# Patient Record
Sex: Male | Born: 1945 | Race: White | Hispanic: No | Marital: Married | Smoking: Never smoker
Health system: Southern US, Community
[De-identification: ages and names within clinical notes are randomized; demographics above are authoritative.]

## PROBLEM LIST (undated history)

## (undated) DIAGNOSIS — H269 Unspecified cataract: Secondary | ICD-10-CM

## (undated) DIAGNOSIS — G8929 Other chronic pain: Secondary | ICD-10-CM

## (undated) DIAGNOSIS — R399 Unspecified symptoms and signs involving the genitourinary system: Secondary | ICD-10-CM

## (undated) DIAGNOSIS — E785 Hyperlipidemia, unspecified: Secondary | ICD-10-CM

## (undated) DIAGNOSIS — R519 Headache, unspecified: Secondary | ICD-10-CM

## (undated) DIAGNOSIS — K219 Gastro-esophageal reflux disease without esophagitis: Secondary | ICD-10-CM

## (undated) DIAGNOSIS — D126 Benign neoplasm of colon, unspecified: Secondary | ICD-10-CM

## (undated) DIAGNOSIS — T7840XA Allergy, unspecified, initial encounter: Secondary | ICD-10-CM

## (undated) DIAGNOSIS — R51 Headache: Secondary | ICD-10-CM

## (undated) HISTORY — DX: Unspecified cataract: H26.9

## (undated) HISTORY — PX: POLYPECTOMY: SHX149

## (undated) HISTORY — DX: Headache: R51

## (undated) HISTORY — DX: Allergy, unspecified, initial encounter: T78.40XA

## (undated) HISTORY — DX: Other chronic pain: G89.29

## (undated) HISTORY — PX: COLONOSCOPY: SHX174

## (undated) HISTORY — PX: EYE SURGERY: SHX253

## (undated) HISTORY — DX: Hyperlipidemia, unspecified: E78.5

## (undated) HISTORY — DX: Unspecified symptoms and signs involving the genitourinary system: R39.9

## (undated) HISTORY — DX: Benign neoplasm of colon, unspecified: D12.6

## (undated) HISTORY — DX: Gastro-esophageal reflux disease without esophagitis: K21.9

## (undated) HISTORY — DX: Headache, unspecified: R51.9

---

## 1996-01-28 HISTORY — PX: CHOLECYSTECTOMY: SHX55

## 2003-04-17 ENCOUNTER — Encounter: Admission: RE | Admit: 2003-04-17 | Discharge: 2003-04-17 | Payer: Self-pay | Admitting: Family Medicine

## 2003-12-08 ENCOUNTER — Ambulatory Visit: Payer: Self-pay | Admitting: Gastroenterology

## 2003-12-14 ENCOUNTER — Ambulatory Visit: Payer: Self-pay | Admitting: Gastroenterology

## 2004-01-31 ENCOUNTER — Ambulatory Visit: Payer: Self-pay | Admitting: Gastroenterology

## 2004-02-02 ENCOUNTER — Ambulatory Visit: Payer: Self-pay | Admitting: Gastroenterology

## 2004-02-07 ENCOUNTER — Ambulatory Visit (HOSPITAL_COMMUNITY): Admission: RE | Admit: 2004-02-07 | Discharge: 2004-02-07 | Payer: Self-pay | Admitting: Gastroenterology

## 2004-02-12 ENCOUNTER — Ambulatory Visit: Payer: Self-pay | Admitting: Gastroenterology

## 2005-06-16 ENCOUNTER — Ambulatory Visit: Payer: Self-pay | Admitting: Gastroenterology

## 2005-06-27 ENCOUNTER — Encounter: Payer: Self-pay | Admitting: Gastroenterology

## 2005-06-27 ENCOUNTER — Ambulatory Visit: Payer: Self-pay | Admitting: Gastroenterology

## 2007-05-21 LAB — CONVERTED CEMR LAB
BUN: 16 mg/dL
CO2, serum: 22 mmol/L
Chloride, Serum: 107 mmol/L
Creatinine, Ser: 0.97 mg/dL
Glucose, Bld: 110 mg/dL
Platelets: 206 10*3/uL
Triglycerides: 54 mg/dL
WBC, blood: 8.7 10*3/uL

## 2007-09-24 ENCOUNTER — Encounter: Payer: Self-pay | Admitting: Internal Medicine

## 2007-09-24 LAB — CONVERTED CEMR LAB
HDL: 68 mg/dL
LDL Cholesterol: 137 mg/dL
Triglycerides: 62 mg/dL

## 2008-05-29 ENCOUNTER — Ambulatory Visit: Payer: Self-pay | Admitting: Internal Medicine

## 2008-05-29 DIAGNOSIS — K219 Gastro-esophageal reflux disease without esophagitis: Secondary | ICD-10-CM

## 2008-05-29 DIAGNOSIS — L989 Disorder of the skin and subcutaneous tissue, unspecified: Secondary | ICD-10-CM | POA: Insufficient documentation

## 2008-06-05 ENCOUNTER — Encounter (INDEPENDENT_AMBULATORY_CARE_PROVIDER_SITE_OTHER): Payer: Self-pay | Admitting: *Deleted

## 2008-06-05 LAB — CONVERTED CEMR LAB
AST: 31 units/L (ref 0–37)
Basophils Absolute: 0 10*3/uL (ref 0.0–0.1)
Basophils Relative: 0 % (ref 0.0–3.0)
CO2: 26 meq/L (ref 19–32)
Calcium: 9.2 mg/dL (ref 8.4–10.5)
Cholesterol: 210 mg/dL — ABNORMAL HIGH (ref 0–200)
Creatinine, Ser: 0.9 mg/dL (ref 0.4–1.5)
Eosinophils Absolute: 0.1 10*3/uL (ref 0.0–0.7)
Lymphocytes Relative: 25.6 % (ref 12.0–46.0)
MCHC: 35.2 g/dL (ref 30.0–36.0)
Neutrophils Relative %: 71.2 % (ref 43.0–77.0)
RBC: 4.82 M/uL (ref 4.22–5.81)
RDW: 12.6 % (ref 11.5–14.6)
TSH: 1.38 microintl units/mL (ref 0.35–5.50)

## 2008-06-20 ENCOUNTER — Encounter: Payer: Self-pay | Admitting: Internal Medicine

## 2008-06-20 ENCOUNTER — Ambulatory Visit: Payer: Self-pay | Admitting: Internal Medicine

## 2008-06-23 ENCOUNTER — Ambulatory Visit: Payer: Self-pay | Admitting: Internal Medicine

## 2008-06-23 LAB — CONVERTED CEMR LAB
Bilirubin Urine: NEGATIVE
Protein, U semiquant: 100
RBC / HPF: NONE SEEN (ref <3)
Urobilinogen, UA: 0.2
WBC, UA: NONE SEEN cells/hpf (ref <3)

## 2008-06-24 ENCOUNTER — Encounter: Payer: Self-pay | Admitting: Internal Medicine

## 2008-06-28 ENCOUNTER — Ambulatory Visit: Payer: Self-pay | Admitting: Internal Medicine

## 2008-07-25 ENCOUNTER — Telehealth (INDEPENDENT_AMBULATORY_CARE_PROVIDER_SITE_OTHER): Payer: Self-pay | Admitting: *Deleted

## 2008-11-29 ENCOUNTER — Ambulatory Visit: Payer: Self-pay | Admitting: Internal Medicine

## 2008-11-29 DIAGNOSIS — R972 Elevated prostate specific antigen [PSA]: Secondary | ICD-10-CM

## 2008-11-29 DIAGNOSIS — R350 Frequency of micturition: Secondary | ICD-10-CM

## 2008-11-29 LAB — CONVERTED CEMR LAB
Glucose, Urine, Semiquant: NEGATIVE
Nitrite: NEGATIVE
PSA: 2.87 ng/mL (ref 0.10–4.00)
Specific Gravity, Urine: 1.015
WBC Urine, dipstick: NEGATIVE
pH: 6

## 2008-11-30 ENCOUNTER — Encounter: Payer: Self-pay | Admitting: Internal Medicine

## 2009-06-12 ENCOUNTER — Ambulatory Visit: Payer: Self-pay | Admitting: Internal Medicine

## 2009-06-13 ENCOUNTER — Encounter: Payer: Self-pay | Admitting: Internal Medicine

## 2009-06-14 ENCOUNTER — Telehealth (INDEPENDENT_AMBULATORY_CARE_PROVIDER_SITE_OTHER): Payer: Self-pay | Admitting: *Deleted

## 2009-06-14 LAB — CONVERTED CEMR LAB
ALT: 27 units/L (ref 0–53)
AST: 18 units/L (ref 0–37)
Basophils Absolute: 0 10*3/uL (ref 0.0–0.1)
Calcium: 9.3 mg/dL (ref 8.4–10.5)
Cholesterol: 226 mg/dL — ABNORMAL HIGH (ref 0–200)
Creatinine, Ser: 0.8 mg/dL (ref 0.4–1.5)
Direct LDL: 159.6 mg/dL
Eosinophils Absolute: 0.1 10*3/uL (ref 0.0–0.7)
GFR calc non Af Amer: 103.42 mL/min (ref >60)
Glucose, Bld: 89 mg/dL (ref 70–99)
HDL: 60.9 mg/dL (ref >39.00)
Hemoglobin: 15.4 g/dL (ref 13.0–17.0)
Lymphocytes Relative: 31.9 % (ref 12.0–46.0)
Monocytes Relative: 7.9 % (ref 3.0–12.0)
Neutro Abs: 3.2 10*3/uL (ref 1.4–7.7)
Neutrophils Relative %: 58.4 % (ref 43.0–77.0)
Platelets: 193 10*3/uL (ref 150.0–400.0)
RDW: 13.3 % (ref 11.5–14.6)
Sodium: 141 meq/L (ref 135–145)
Total CHOL/HDL Ratio: 4
VLDL: 7.4 mg/dL (ref 0.0–40.0)

## 2009-08-02 ENCOUNTER — Telehealth (INDEPENDENT_AMBULATORY_CARE_PROVIDER_SITE_OTHER): Payer: Self-pay | Admitting: *Deleted

## 2009-08-07 ENCOUNTER — Encounter: Payer: Self-pay | Admitting: Internal Medicine

## 2009-09-10 ENCOUNTER — Ambulatory Visit: Payer: Self-pay | Admitting: Internal Medicine

## 2009-09-10 DIAGNOSIS — E785 Hyperlipidemia, unspecified: Secondary | ICD-10-CM

## 2009-09-13 LAB — CONVERTED CEMR LAB: Cholesterol: 188 mg/dL (ref 0–200)

## 2009-10-08 ENCOUNTER — Ambulatory Visit: Payer: Self-pay | Admitting: Internal Medicine

## 2009-10-08 DIAGNOSIS — J019 Acute sinusitis, unspecified: Secondary | ICD-10-CM | POA: Insufficient documentation

## 2009-10-24 ENCOUNTER — Telehealth (INDEPENDENT_AMBULATORY_CARE_PROVIDER_SITE_OTHER): Payer: Self-pay | Admitting: *Deleted

## 2010-02-12 ENCOUNTER — Ambulatory Visit
Admission: RE | Admit: 2010-02-12 | Discharge: 2010-02-12 | Payer: Self-pay | Source: Home / Self Care | Attending: Internal Medicine | Admitting: Internal Medicine

## 2010-02-26 NOTE — Assessment & Plan Note (Signed)
Summary: CPX,FASTING,BC[employer] INS/RH......Rick Campbell   Vital Signs:  Patient profile:   65 year old male Height:      65.5 inches Weight:      181.6 pounds BMI:     29.87 Pulse rate:   60 / minute BP sitting:   110 / 60  Vitals Entered By: Shary Decamp (Jun 12, 2009 9:49 AM) CC: CPX, FASTING Is Patient Diabetic? No   History of Present Illness: CPX  occasionally has R ear ache  no d/c, no obvious  hearing decreased h/o B tinnitus x years   Preventive Screening-Counseling & Management  Alcohol-Tobacco     Alcohol drinks/day: <1     Alcohol type: WINE     Smoking Status: never  Caffeine-Diet-Exercise     Caffeine use/day: 2     Times/week: 4      Drug Use:  no.    Current Medications (verified): 1)  Aspir-Low 81 Mg Tbec (Aspirin) .Rick Campbell.. 1 By Mouth Once Daily 2)  Aciphex 20 Mg Tbec (Rabeprazole Sodium) .Rick Campbell.. 1 By Mouth Once Daily 3)  Mvi  Allergies (verified): 1)  ! Sulfa  Past History:  Past Medical History: Reviewed history from 05/29/2008 and no changes required. GERD  Past Surgical History: Reviewed history from 05/29/2008 and no changes required. Cholecystectomy (2002)  Family History: Fa 65 y/o CAD - F (MI, CABG age 82 - CHF) HTN - M Stroke - no DM - F colon Ca - F lung Ca - M +smoker prostate Ca - no  Social History: Married 2 kids retired from [employer] Never Smoked Alcohol use-yes (1-2 glasses wine every week) Drug use-no Regular exercise-yes (3-4 x wk) diet-- trying to eat healthy Smoking Status:  never Drug Use:  no Caffeine use/day:  2  Review of Systems General:  Denies fatigue, fever, and weight loss. CV:  Denies chest pain or discomfort and swelling of feet. Resp:  Denies cough and shortness of breath. GI:  Denies bloody stools, diarrhea, nausea, and vomiting. GU:  Denies urinary frequency and urinary hesitancy.  Physical Exam  General:  alert, well-developed, and well-nourished.   Ears:  R ear normal, L ear normal, and no external  deformities.  palpation of the mastoid area is symmetric, no redness. Neck:  no masses and no thyromegaly.   Lungs:  normal respiratory effort, no intercostal retractions, no accessory muscle use, and normal breath sounds.   Heart:  normal rate, regular rhythm, no murmur, and no gallop.   Abdomen:  soft, non-tender, normal bowel sounds, no distention, and no masses.   Rectal:  No external abnormalities noted. Normal sphincter tone. No rectal masses or tenderness. Hemoccult negative Prostate:  Prostate gland firm and smooth, no enlargement, nodularity, tenderness, mass, asymmetry or induration. Extremities:  no lower extremity edema   Impression & Recommendations:  Problem # 1:  HEALTH SCREENING (ICD-V70.0) Td 05 Colonoscopy:   Date:  06/27/2005   Next Due:  06/2010    Results:  normal   PSAs were   usually around  2, one time it went upto 4----> recheck a PSA today recommend to continue with his healthy life style he also has mild otalgia, exam negative, offered a referral to ENT but we agreed to observe, will call if symptoms increase   Orders: TLB-Lipid Panel (80061-LIPID) TLB-TSH (Thyroid Stimulating Hormone) (84443-TSH) TLB-PSA (Prostate Specific Antigen) (84153-PSA) TLB-BMP (Basic Metabolic Panel-BMET) (80048-METABOL) TLB-CBC Platelet - w/Differential (85025-CBCD) TLB-ALT (SGPT) (84460-ALT) TLB-AST (SGOT) (84450-SGOT)  Complete Medication List: 1)  Aspir-low 81 Mg Tbec (  Aspirin) .Rick Campbell.. 1 by mouth once daily 2)  Aciphex 20 Mg Tbec (Rabeprazole sodium) .Rick Campbell.. 1 by mouth once daily 3)  Mvi   Other Orders: Venipuncture (21308)  Patient Instructions: 1)  Please schedule a follow-up appointment in 1 year.  2)  call by mid June, speak with my nurse and schedule a Shingles vaccine \  Preventive Care Screening  Prior Values:    PSA:  2.87 (11/29/2008)    Colonoscopy:  normal (06/27/2005)    Last Tetanus Booster:  Td (09/15/2003)    Risk Factors:  Tobacco use:  never Drug  use:  no Caffeine use:  2 drinks per day Alcohol use:  yes    Type:  WINE    Drinks per day:  <1 Exercise:  yes    Times per week:  4

## 2010-02-26 NOTE — Progress Notes (Signed)
Summary: ACIPHEX REFILL  Phone Note Refill Request Message from:  Fax from Pharmacy on October 24, 2009 4:56 PM  Refills Requested: Medication #1:  ACIPHEX 20 MG TBEC 1 by mouth once daily [facility] [phone]  Initial call taken by: Jerolyn Shin,  October 24, 2009 4:56 PM    Prescriptions: ACIPHEX 20 MG TBEC (RABEPRAZOLE SODIUM) 1 by mouth once daily  #90 x 1   Entered by:   Army Fossa CMA   Authorized by:   Nolon Rod. Paz MD   Signed by:   Army Fossa CMA on 10/25/2009   Method used:   Faxed to ...       [facility] (mail[facility]order)       [address] [place], [place]  [address]       Ph: [phone]       Fax: [phone]   RxID:   [id number]

## 2010-02-26 NOTE — Progress Notes (Signed)
Summary: ENT & Shingles Vaccine  Phone Note Call from Patient Call back at Home Phone [phone]   Caller: Patient Summary of Call: Patient called and left a voicemail on the triage line to find out if we had any avaible singles vaccines and to find out who we would recommed for an ENT specialist.  Initial call taken by: Harold Barban,  August 02, 2009 1:44 PM  Follow-up for Phone Call        Called patient back to inform him we do have the vaccine and that we normally refer to [facility].  Follow-up by: Harold Barban,  August 02, 2009 1:45 PM

## 2010-02-26 NOTE — Progress Notes (Signed)
Summary: results, [facility] 5/19  Phone Note Outgoing Call Call back at Work Phone [phone]   Reason for Call: Discuss lab or test results Details for Reason: advised patient his cholesterol is okay but has increased compared  to last year; because he has a family history of heart disease ideal LDL is  close to 100, currently 159.6 All other labs normal Plan: Improved diet, refer to a nutritionist if needed, FLP in 3 months... DX mild hyperlipidemia. (no need for office visit)  Consider medication Signed by Nolon Rod. Paz MD on 06/13/2009 at 4:49 PM  Summary of Call: left message on machine for pt to return call........Marland KitchenShary Decamp  Jun 14, 2009 9:50 AM left message on machine for pt to return call...........Marland KitchenShary Decamp  Jun 14, 2009 11:17 AM discussed with pt, copy mailed to pt, pt will let us know if he would like referral to nutritionist......Marland KitchenShary Decamp  Jun 14, 2009 11:37 AM

## 2010-02-26 NOTE — Assessment & Plan Note (Signed)
Summary: SINUS INF, NO FEVER,  HACKING DRY COUGH////SPH   Vital Signs:  Patient profile:   65 year old male Weight:      183.13 pounds Temp:     98.2 degrees F oral Pulse rate:   79 / minute Pulse rhythm:   regular BP sitting:   124 / 80  (left arm) Cuff size:   large  Vitals Entered By: Army Fossa CMA (October 08, 2009 2:53 PM) CC: Pt c/o Sinus Infection?  Comments Has a HA Dry Cough Nasal Drainage  Pharm- [facility] [address]   History of Present Illness: history of sinus congestion, dry cough, frontal headaches. ++ postnasal dripping.  ROS No fever No sputum production or nasal discharge No nausea or vomiting No myalgias  Current Medications (verified): 1)  Aspir-Low 81 Mg Tbec (Aspirin) .Marland Kitchen.. 1 By Mouth Once Daily 2)  Aciphex 20 Mg Tbec (Rabeprazole Sodium) .Marland Kitchen.. 1 By Mouth Once Daily 3)  Mvi  Allergies: 1)  ! Sulfa  Past History:  Past Medical History: Reviewed history from 05/29/2008 and no changes required. GERD  Past Surgical History: Reviewed history from 05/29/2008 and no changes required. Cholecystectomy (2002)  Social History: Reviewed history from 06/12/2009 and no changes required. Married 2 kids retired from [employer] Never Smoked Alcohol use-yes (1-2 glasses wine every week) Drug use-no Regular exercise-yes (3-4 x wk) diet-- trying to eat healthy  Physical Exam  General:  alert, well-developed, and well-nourished.   Head:  face symmetric, slightly tender in the frontal and sinus maxillary area Ears:  R ear normal and L ear normal.   Nose:  slightly congested Mouth:  no redness or discharge Lungs:  normal respiratory effort, no intercostal retractions, no accessory muscle use, and normal breath sounds.   Heart:  normal rate, regular rhythm, no murmur, and no gallop.     Impression & Recommendations:  Problem # 1:  SINUSITIS - ACUTE-NOS (ICD-461.9)  symptoms consistent with sinusitis, see instructions  His  updated medication list for this problem includes:    Amoxicillin 500 Mg Tabs (Amoxicillin) .Marland Kitchen... 2 by mouth twice a day for 10 days    Guiatuss Ac 100-10 Mg/30ml Syrp (Guaifenesin-codeine) ..... One or 2 teaspoons at bedtime as needed for severe cough  Complete Medication List: 1)  Aspir-low 81 Mg Tbec (Aspirin) .Marland Kitchen.. 1 by mouth once daily 2)  Aciphex 20 Mg Tbec (Rabeprazole sodium) .Marland Kitchen.. 1 by mouth once daily 3)  Mvi  4)  Amoxicillin 500 Mg Tabs (Amoxicillin) .... 2 by mouth twice a day for 10 days 5)  Guiatuss Ac 100-10 Mg/20ml Syrp (Guaifenesin-codeine) .... One or 2 teaspoons at bedtime as needed for severe cough  Patient Instructions: 1)  rest, fluids, Tylenol as needed 2)  Mucinex DM twice a day for one week 3)   Codeine at bedtime if the cough is severe 4)  Amoxicillin for 10 days 5)  Nasal saline spray twice a day 6)  Call if not improving within the next 10 days Prescriptions: GUIATUSS AC 100-10 MG/5ML SYRP (GUAIFENESIN-CODEINE) One or 2 teaspoons at bedtime as needed for severe cough  #120cc x 0   Entered and Authorized by:   Nolon Rod. Paz MD   Signed by:   Nolon Rod. Paz MD on 10/08/2009   Method used:   Print then Give to Patient   RxID:   [id number] AMOXICILLIN 500 MG TABS (AMOXICILLIN) 2 by mouth twice a day for 10 days  #40 x 0   Entered and Authorized  by:   Nolon Rod Paz MD   Signed by:   Nolon Rod. Paz MD on 10/08/2009   Method used:   Print then Give to Patient   RxID:   [id number]

## 2010-02-26 NOTE — Consult Note (Signed)
Summary: tinnitus, ear ache----ENT  [facility]   Imported By: Lanelle Bal 08/15/2009 10:11:21  _____________________________________________________________________  External Attachment:    Type:   Image     Comment:   External Document

## 2010-02-26 NOTE — Assessment & Plan Note (Signed)
Summary: shingles vac - lab FLP: DX mild hyperlipidemia/cbs  Nurse Visit   Allergies: 1)  ! Sulfa  Immunizations Administered:  Zostavax # 1:    Vaccine Type: Zostavax    Site: left deltoid    Mfr: Merck    Dose: 0.5 ml    Route: [place]    Given by: Army Fossa CMA    Exp. Date: 09/13/2010    Lot #: [id number]  Orders Added: 1)  Venipuncture [19147] 2)  TLB-Lipid Panel [80061-LIPID] 3)  Specimen Handling [99000] 4)  Zoster (Shingles) Vaccine Live [90736] 5)  Admin 1st Vaccine [82956]

## 2010-02-26 NOTE — Letter (Signed)
Summary: Cancer Screening/[facility] Personalized Risk Profile   Cancer Screening/[facility] Personalized Risk Profile   Imported By: Lanelle Bal 06/20/2009 09:16:04  _____________________________________________________________________  External Attachment:    Type:   Image     Comment:   External Document

## 2010-02-28 NOTE — Assessment & Plan Note (Signed)
Summary: sinus infection//lch   Vital Signs:  Patient profile:   65 year old male Weight:      186 pounds Temp:     98.6 degrees F oral Pulse rate:   88 / minute Pulse rhythm:   regular BP sitting:   124 / 88  (left arm) Cuff size:   large  Vitals Entered By: Army Fossa CMA (February 12, 2010 9:42 AM) CC: Sinus inf?  Comments - HA - Sinus pressure - coughing started christmas fasting  [facility] [address]    History of Present Illness: developed a URI during Christmas. Overall he feels better but he has persistent cough, postnasal dripping and a frontal headache. The cough is mostly dry. Taking Mucinex D over-the-counter  Current Medications (verified): 1)  Aspir-Low 81 Mg Tbec (Aspirin) .Marland Kitchen.. 1 By Mouth Once Daily 2)  Aciphex 20 Mg Tbec (Rabeprazole Sodium) .Marland Kitchen.. 1 By Mouth Once Daily 3)  Mvi  Allergies (verified): 1)  ! Sulfa  Past History:  Past Medical History: Reviewed history from 05/29/2008 and no changes required. GERD  Past Surgical History: Reviewed history from 05/29/2008 and no changes required. Cholecystectomy (2002)  Social History: Reviewed history from 06/12/2009 and no changes required. Married 2 kids retired from [employer] Never Smoked Alcohol use-yes (1-2 glasses wine every week) Drug use-no Regular exercise-yes (3-4 x wk) diet-- trying to eat healthy  Review of Systems General:  Denies fever. Resp:  Denies shortness of breath and wheezing. GI:  Denies nausea and vomiting. MS:  Denies muscle weakness.  Physical Exam  General:  alert and well-developed.  no apparent distress Head:  face symetric, slightly  tender at L side of the maxilary sinus  Ears:  R ear normal and L ear normal.   Mouth:  no red or d/c Lungs:  normal respiratory effort, no intercostal retractions, no accessory muscle use, and normal breath sounds.   Heart:  normal rate, regular rhythm, no murmur, and no gallop.     Impression & Recommendations:  Problem #  1:  SINUSITIS - ACUTE-NOS (ICD-461.9) presents again with symptoms consistent with sinusitis. see instructions  His updated medication list for this problem includes:    Amoxicillin 500 Mg Tabs (Amoxicillin) .Marland Kitchen... 2 by mouth twice a day for 10 days    Hydrocodone-homatropine 5-1.5 Mg/69ml Syrp (Hydrocodone-homatropine) .Marland Kitchen... 1 tsp by mouth every 6 hours as needed cough    Omnaris 50 Mcg/act Susp (Ciclesonide) .Marland Kitchen... 2 puffs on each side of the nose  Complete Medication List: 1)  Aspir-low 81 Mg Tbec (Aspirin) .Marland Kitchen.. 1 by mouth once daily 2)  Aciphex 20 Mg Tbec (Rabeprazole sodium) .Marland Kitchen.. 1 by mouth once daily 3)  Mvi  4)  Amoxicillin 500 Mg Tabs (Amoxicillin) .... 2 by mouth twice a day for 10 days 5)  Hydrocodone-homatropine 5-1.5 Mg/36ml Syrp (Hydrocodone-homatropine) .Marland Kitchen.. 1 tsp by mouth every 6 hours as needed cough 6)  Omnaris 50 Mcg/act Susp (Ciclesonide) .... 2 puffs on each side of the nose  Patient Instructions: 1)  rest, fluids, tylenol 2)  omnarix x 2 months 3)  mucinex DM two times a day as needed for cough 4)  if the cough is severe ----> hydrocodone, watch for drowsines 5)  amoxicillin  6)  call if no better in 2 weeks 7)  call if symptoms severe Prescriptions: OMNARIS 50 MCG/ACT SUSP (CICLESONIDE) 2 puffs on each side of the nose  #1 x 1   Entered and Authorized by:   Elita Quick E. Deklan Minar MD  Signed by:   Nolon Rod Temple Sporer MD on 02/12/2010   Method used:   Print then Give to Patient   RxID:   [id number] AMOXICILLIN 500 MG TABS (AMOXICILLIN) 2 by mouth twice a day for 10 days  #40 x 0   Entered and Authorized by:   Nolon Rod. Mars Scheaffer MD   Signed by:   Nolon Rod. Angelicia Lessner MD on 02/12/2010   Method used:   Print then Give to Patient   RxID:   [id number] HYDROCODONE-HOMATROPINE 5-1.5 MG/5ML SYRP (HYDROCODONE-HOMATROPINE) 1 tsp by mouth every 6 hours as needed cough  #150cc x 0   Entered and Authorized by:   Nolon Rod. Arlin Savona MD   Signed by:   Nolon Rod. Demonica Farrey MD on 02/12/2010   Method used:   Print then  Give to Patient   RxID:   [id number]    Orders Added: 1)  Est. Patient Level III [01027]

## 2010-04-09 ENCOUNTER — Ambulatory Visit (INDEPENDENT_AMBULATORY_CARE_PROVIDER_SITE_OTHER): Payer: BC Managed Care – PPO | Admitting: Internal Medicine

## 2010-04-09 ENCOUNTER — Encounter: Payer: Self-pay | Admitting: Internal Medicine

## 2010-04-09 DIAGNOSIS — R058 Other specified cough: Secondary | ICD-10-CM | POA: Insufficient documentation

## 2010-04-09 DIAGNOSIS — R05 Cough: Secondary | ICD-10-CM

## 2010-04-11 ENCOUNTER — Other Ambulatory Visit: Payer: Self-pay | Admitting: Internal Medicine

## 2010-04-11 DIAGNOSIS — R05 Cough: Secondary | ICD-10-CM

## 2010-04-15 ENCOUNTER — Ambulatory Visit (INDEPENDENT_AMBULATORY_CARE_PROVIDER_SITE_OTHER)
Admission: RE | Admit: 2010-04-15 | Discharge: 2010-04-15 | Disposition: A | Discharge status: Home / Self Care | Payer: BC Managed Care – PPO | Source: Ambulatory Visit | Attending: Internal Medicine | Admitting: Internal Medicine

## 2010-04-15 ENCOUNTER — Other Ambulatory Visit: Payer: Self-pay | Admitting: Internal Medicine

## 2010-04-15 ENCOUNTER — Telehealth: Payer: Self-pay | Admitting: Internal Medicine

## 2010-04-15 DIAGNOSIS — R05 Cough: Secondary | ICD-10-CM

## 2010-04-16 ENCOUNTER — Other Ambulatory Visit: Payer: Self-pay | Admitting: Internal Medicine

## 2010-04-16 NOTE — Assessment & Plan Note (Signed)
Summary: congested,headache/cbs   Vital Signs:  Patient profile:   65 year old male Height:      65.5 inches Weight:      179.25 pounds BMI:     29.48 Temp:     98.4 degrees F oral Pulse rate:   86 / minute Pulse rhythm:   regular BP sitting:   126 / 82  (left arm) Cuff size:   large  Vitals Entered By: Army Fossa CMA (April 09, 2010 2:39 PM) CC: Pt here c/o HA, coughing, head congestion Comments [facility] [address]    History of Present Illness:  patient was seen in December 2011 and January 2012 with similar symptoms.  he was prescribed amoxicillin on the second visit, since then he improved to some extent but a dry persistent cough never went away.  2 weeks ago, 2 family members got sick with a cold  and he thinks he "catch it" as well...all his previous symptoms came back: Cough , sinus congestion and discharge , postnasal dripping. This time the  cough is not nocturnal    ROS  denies any shortness of breath or wheezing  No fever  no itchy eyes or itchy nose  Current Medications (verified): 1)  Aspir-Low 81 Mg Tbec (Aspirin) .Marland Kitchen.. 1 By Mouth Once Daily 2)  Aciphex 20 Mg Tbec (Rabeprazole Sodium) .Marland Kitchen.. 1 By Mouth Once Daily 3)  Mvi  Allergies (verified): 1)  ! Sulfa  Past History:  Past Medical History: Reviewed history from 05/29/2008 and no changes required. GERD  Past Surgical History: Reviewed history from 05/29/2008 and no changes required. Cholecystectomy (2002)  Social History: Reviewed history from 06/12/2009 and no changes required. Married 2 kids retired from [employer] Never Smoked Alcohol use-yes (1-2 glasses wine every week) Drug use-no Regular exercise-yes (3-4 x wk) diet-- trying to eat healthy  Physical Exam  General:  alert and well-developed.   nontoxic Head:   face symmetric, mild tenderness throughout the sinuses Ears:  R ear normal and L ear normal.   Nose:   mild congestion Mouth:   no redness or discharge Lungs:  normal  respiratory effort, no intercostal retractions, no accessory muscle use, and normal breath sounds.   Heart:  normal rate, regular rhythm, no murmur, and no gallop.     Impression & Recommendations:  Problem # 1:  COUGH (ICD-786.2) persistent cough since  December   plan:  chest x-ray CT sinuses  symptomatic treatment with hydrocodone and omnaris (wich apparently helped)  refer to pulmonary  Orders: T-2 View CXR (71020TC) Radiology Referral (Radiology) Pulmonary Referral (Pulmonary)  Complete Medication List: 1)  Aspir-low 81 Mg Tbec (Aspirin) .Marland Kitchen.. 1 by mouth once daily 2)  Aciphex 20 Mg Tbec (Rabeprazole sodium) .Marland Kitchen.. 1 by mouth once daily 3)  Mvi  4)  Hydrocodone-homatropine 5-1.5 Mg/55ml Syrp (Hydrocodone-homatropine) .Marland Kitchen.. 1 tsp by mouth every 6 hours as needed cough 5)  Omnaris 50 Mcg/act Susp (Ciclesonide) .... 2 puffs on each side of the nose  Patient Instructions: 1)  mucinex dm as needed for cough 2)  omnaris, hydrocodone as before  3)  Chest XR 4)  sinus CT 5)  call if symptoms increase  Prescriptions: HYDROCODONE-HOMATROPINE 5-1.5 MG/5ML SYRP (HYDROCODONE-HOMATROPINE) 1 tsp by mouth every 6 hours as needed cough  #200cc x 0   Entered and Authorized by:   Nolon Rod. Lucy Boardman MD   Signed by:   Nolon Rod. Rebeka Kimble MD on 04/09/2010   Method used:   Print then Give to Patient  RxID:   [id number] OMNARIS 50 MCG/ACT SUSP (CICLESONIDE) 2 puffs on each side of the nose  #1 x 1   Entered and Authorized by:   Elita Quick E. Quandarius Nill MD   Signed by:   Nolon Rod. Perl Kerney MD on 04/09/2010   Method used:   Print then Give to Patient   RxID:   [id number]    Orders Added: 1)  T-2 View CXR [71020TC] 2)  Radiology Referral [Radiology] 3)  Pulmonary Referral [Pulmonary] 4)  Est. Patient Level III [84696]

## 2010-04-16 NOTE — Telephone Encounter (Signed)
Left message for pt to call back- per yesterday pt wanted to take a different med, need to verify that pt does not want to take Omnaris.

## 2010-04-16 NOTE — Telephone Encounter (Signed)
Pt called back and states he will stay on the Nasonex.

## 2010-04-25 NOTE — Progress Notes (Signed)
Summary: out of Omnaris  Phone Note Call from Patient Call back at Home Phone [phone] P PH     Caller: Patient Summary of Call: Pts pharm is out of Omnaris- is there an alternative for pt?   [facility] [address]  Initial call taken by: Army Fossa CMA,  April 15, 2010 2:26 PM  Follow-up for Phone Call        advise patient: CXR normal  alternatives are... nasonex 2 sprays on each side of the nose daily  Flonase 2 sprays on each side of the nose daily Follow-up by: Gordan Grell E. Shariyah Eland MD,  April 15, 2010 5:08 PM  Additional Follow-up for Phone Call Additional follow up Details #1::        pts wife is aware. Army Fossa CMA  April 15, 2010 5:10 PM     New/Updated Medications: NASONEX 50 MCG/ACT SUSP (MOMETASONE FUROATE) 2 sprays on each side of the nose daily Prescriptions: NASONEX 50 MCG/ACT SUSP (MOMETASONE FUROATE) 2 sprays on each side of the nose daily  #1 x 2   Entered by:   Army Fossa CMA   Authorized by:   Nolon Rod. Anvay Tennis MD   Signed by:   Army Fossa CMA on 04/15/2010   Method used:   Electronically to        [id number] (retail)       [address] [place]       [place], [place]  [address]       Ph: [phone]       Fax: [phone]   RxID:   [id number]

## 2010-04-30 ENCOUNTER — Institutional Professional Consult (permissible substitution): Payer: BC Managed Care – PPO | Admitting: Internal Medicine

## 2010-05-06 ENCOUNTER — Encounter: Payer: Self-pay | Admitting: Internal Medicine

## 2010-05-08 ENCOUNTER — Encounter: Payer: Self-pay | Admitting: Internal Medicine

## 2010-05-08 ENCOUNTER — Ambulatory Visit (INDEPENDENT_AMBULATORY_CARE_PROVIDER_SITE_OTHER): Payer: Medicare Other | Admitting: Internal Medicine

## 2010-05-08 VITALS — BP 100/60 | HR 60 | Temp 97.5°F | Ht 66.0 in | Wt 179.4 lb

## 2010-05-08 DIAGNOSIS — R05 Cough: Secondary | ICD-10-CM

## 2010-05-08 DIAGNOSIS — J019 Acute sinusitis, unspecified: Secondary | ICD-10-CM

## 2010-05-08 NOTE — Assessment & Plan Note (Signed)
The most common causes of chronic cough in immunocompetent adults include the following: upper airway cough syndrome (UACS), previously referred to as postnasal drip syndrome (PNDS), which is caused by variety of rhinosinus conditions; (2) asthma; (3) GERD; (4) chronic bronchitis from cigarette smoking or other inhaled environmental irritants; (5) nonasthmatic eosinophilic bronchitis; and (6) bronchiectasis.   These conditions, singly or in combination, have accounted for up to 94% of the causes of chronic cough in prospective studies.   Other conditions have constituted no >6% of the causes in prospective studies These have included bronchogenic carcinoma, chronic interstitial pneumonia, sarcoidosis, left ventricular failure, ACEI-induced cough, and aspiration from a condition associated with pharyngeal dysfunction.   Sinus CT 04/15/10 neg so this lessens the ddx  Of the three most common causes of chronic cough, only one (GERD)  can actually cause the other two (asthma and post nasal drip syndrome)  and perpetuate the cylce of cough inducing airway trauma, inflammation, heightened sensitivity to reflux which is prompted by the cough itself via a cyclical mechanism.    This may partially respond to steroids and look like asthma and post nasal drainage but never erradicated completely unless the cough and the secondary reflux are eliminated, preferably both at the same time.  While not intuitively obvious, many patients with chronic low grade reflux do not cough until there is a secondary insult that disturbs the protective epithelial barrier and exposes sensitive nerve endings.  This can be viral or direct physical injury such as with an endotracheal tube.   The point is that once this occurs, it is difficult to eliminate using anything but a maximally effective acid suppression regimen at least in the short run, accompanied by an appropriate diet to address non acid GERD.  Discussed ways to eliminate  this

## 2010-05-08 NOTE — Progress Notes (Deleted)
  Subjective:    Patient ID: Rick Campbell, male    DOB: [birth year], 65 y.o.   MRN: [id number]  HPI    Review of Systems     Objective:   Physical Exam        Assessment & Plan:

## 2010-05-08 NOTE — Patient Instructions (Signed)
Continue to take Aciphex 20 mg 30-60 before bfast every day  GERD (REFLUX)  is an extremely common cause of respiratory symptoms, many times with no significant heartburn at all.    It can be treated with medication, but also with lifestyle changes including avoidance of late meals, excessive alcohol, smoking cessation, and avoid fatty foods, chocolate, peppermint, colas, red wine, and acidic juices such as orange juice.  NO MINT OR MENTHOL PRODUCTS SO NO COUGH DROPS  USE SUGARLESS CANDY INSTEAD (jolley ranchers or Stover's)  NO OIL BASED VITAMINS   Taper omnaris off   If worse resp symptoms for any reason rec the following For cough mucinex dm Increase aciphex to Take 30- 60 min before your first and last meals of the day and add Pepcid 20 mg at bedtime (remember that acid reflux is to respiratory symptoms what oxygen is to fire)  If not better w/in 2 weeks return to Pulmonary Clinic

## 2010-05-08 NOTE — Progress Notes (Signed)
  Subjective:    Patient ID: Rick Campbell, male    DOB: October 03, 1945, 65 y.o.   MRN: [id number]  HPI 51 yowm never smoker grew up with exposure to parents but no resp problems until age 49's and since then" every cold turns into to persistent cough to point of gagging" resolves p weeks with abx , cough meds and steroids  But in between always great for months with no rx.  Eval by allergy ([place]) Pos for dust/ molds and cats on shots x 4-5 years with no effect on this pattern so stopped in 1987,  referred to Pulmonary Clinic by Dr Drue Novel to address ? mech for cough.  05/08/2010 Initial pulmonary office eval cc all better now but wants to know how to prevent another recurrent cough p coughed from Nov2011 through March 2012 and tapering off nasal steroids.  No purulent secretions.    Pt denies any significant sore throat, dysphagia, itching, sneezing,  nasal congestion or excess/ purulent secretions,  fever, chills, sweats, unintended wt loss, pleuritic or exertional cp, hempoptysis, orthopnea pnd or leg swelling.    Also denies any obvious fluctuation of symptoms with weather or environmental changes or other aggravating or alleviating factors.     Review of Systems  Constitutional: Negative for fever, chills, activity change, appetite change and unexpected weight change.  HENT: Positive for rhinorrhea and postnasal drip. Negative for congestion, sore throat, sneezing, trouble swallowing, dental problem and voice change.   Eyes: Negative for visual disturbance.  Respiratory: Positive for cough and choking. Negative for shortness of breath.   Cardiovascular: Negative for chest pain and leg swelling.  Gastrointestinal: Negative for nausea, vomiting and abdominal pain.  Genitourinary: Negative for difficulty urinating.  Musculoskeletal: Negative for arthralgias.  Skin: Negative for rash.  Psychiatric/Behavioral: Negative for behavioral problems and confusion.       Objective:   Physical Exam   pleasant amb wm nad  Wt 179 05/08/2010   HEENT: nl dentition,  and orophanx. Nl external ear canals without cough reflex. Mild nonspecific turbinate edema   NECK :  without JVD/Nodes/TM/ nl carotid upstrokes bilaterally   LUNGS: no acc muscle use, clear to A and P bilaterally without cough on insp or exp maneuvers   CV:  RRR  no s3 or murmur or increase in P2, no edema   ABD:  soft and nontender with nl excursion in the supine position. No bruits or organomegaly, bowel sounds nl  MS:  warm without deformities, calf tenderness, cyanosis or clubbing  SKIN: warm and dry without lesions    NEURO:  alert, approp, no deficits     cxr 04/15/10 No active cardiopulmonary disease.  Assessment & Plan:

## 2010-06-21 ENCOUNTER — Ambulatory Visit (INDEPENDENT_AMBULATORY_CARE_PROVIDER_SITE_OTHER): Payer: Medicare Other | Admitting: Internal Medicine

## 2010-06-21 ENCOUNTER — Encounter: Payer: Self-pay | Admitting: Internal Medicine

## 2010-06-21 DIAGNOSIS — R05 Cough: Secondary | ICD-10-CM

## 2010-06-21 DIAGNOSIS — R972 Elevated prostate specific antigen [PSA]: Secondary | ICD-10-CM

## 2010-06-21 DIAGNOSIS — Z0000 Encounter for general adult medical examination without abnormal findings: Secondary | ICD-10-CM | POA: Insufficient documentation

## 2010-06-21 DIAGNOSIS — Z23 Encounter for immunization: Secondary | ICD-10-CM

## 2010-06-21 DIAGNOSIS — E785 Hyperlipidemia, unspecified: Secondary | ICD-10-CM

## 2010-06-21 DIAGNOSIS — K219 Gastro-esophageal reflux disease without esophagitis: Secondary | ICD-10-CM

## 2010-06-21 LAB — CBC WITH DIFFERENTIAL/PLATELET
Eosinophils Absolute: 0.1 10*3/uL (ref 0.0–0.7)
Hemoglobin: 15.1 g/dL (ref 13.0–17.0)
Lymphocytes Relative: 34 % (ref 12–46)
Lymphs Abs: 2.1 10*3/uL (ref 0.7–4.0)
MCH: 31.7 pg (ref 26.0–34.0)
Monocytes Relative: 8 % (ref 3–12)
Neutro Abs: 3.6 10*3/uL (ref 1.7–7.7)
Neutrophils Relative %: 57 % (ref 43–77)
RBC: 4.77 MIL/uL (ref 4.22–5.81)
WBC: 6.3 10*3/uL (ref 4.0–10.5)

## 2010-06-21 LAB — BASIC METABOLIC PANEL
CO2: 23 mEq/L (ref 19–32)
Calcium: 9.5 mg/dL (ref 8.4–10.5)
Chloride: 102 mEq/L (ref 96–112)
Potassium: 4.3 mEq/L (ref 3.5–5.3)
Sodium: 139 mEq/L (ref 135–145)

## 2010-06-21 LAB — LIPID PANEL
Cholesterol: 188 mg/dL (ref 0–200)
HDL: 46 mg/dL (ref >39)
LDL Cholesterol: 129 mg/dL — ABNORMAL HIGH (ref 0–99)
Total CHOL/HDL Ratio: 4.1 Ratio
Triglycerides: 63 mg/dL (ref <150)
VLDL: 13 mg/dL (ref 0–40)

## 2010-06-21 NOTE — Assessment & Plan Note (Signed)
Recently seen by pulmonary, cough thought to be from GERD, doing great on PPIs

## 2010-06-21 NOTE — Progress Notes (Signed)
  Subjective:    Patient ID: Rick Campbell, male    DOB: 1945-05-02, 65 y.o.   MRN: [id number]  HPI  Here for Medicare AWV:  1. Risk factors based on Past M, S, F history: reviewed 2. Physical Activities:  Very active , see below  3. Depression/mood:  No problems noted or reported  4. Hearing:  Saw ENT, some hearing loss , no need for aids so far  5. ADL's:  Independent  6. Fall Risk: low  7. home Safety: does feelsafe at home  8. Height, weight, &visual acuity: see VS, vision well corrected w/ glasses  9. Counseling: provided 10. Labs ordered based on risk factors: if needed  11. Referral Coordination: if needed 12.  Care Plan, see assessment and plan  13.   Cognitive Assessment:motor skills and cognition wnl   In addition, today we discussed the following: Cough--salt pulmonary, feels 100% better, not reviewed, most likely he had GERD, on PPIs GERD--no classic heartburn but had cough, now asymptomatic on PPIs  Cholesterol -- working on diet, very active   Past Medical History  Diagnosis Date  . Esophageal reflux   . Chronic headache   . Hyperlipidemia    Past Surgical History  Procedure Date  . Cholecystectomy 1998    Family History: Fa 65y/o CAD - F (MI, CABG age 90 - CHF) HTN - M Stroke - no DM - F colon Ca - F lung Ca - M +smoker prostate Ca - no  Social History: Married 2 kids retired from [employer] Never Smoked Alcohol use-yes (1-2 glasses wine every week) Regular exercise-yes (4-5  x wk) diet-- trying to eat healthy    Review of Systems  Constitutional: Negative for fever and fatigue.  Respiratory: Negative for shortness of breath and wheezing.   Cardiovascular: Negative for chest pain and leg swelling.  Gastrointestinal: Negative for abdominal pain and blood in stool.  Genitourinary: Negative for dysuria, hematuria and difficulty urinating.  Psychiatric/Behavioral: The patient is not nervous/anxious.        Objective:   Physical Exam    Constitutional: He is oriented to person, place, and time. He appears well-developed and well-nourished. No distress.  HENT:  Head: Normocephalic and atraumatic.  Neck: No thyromegaly present.       Normal carotid pulse  Cardiovascular: Normal rate, regular rhythm and normal heart sounds.   No murmur heard. Pulmonary/Chest: Effort normal and breath sounds normal. No respiratory distress. He has no wheezes. He has no rales.  Abdominal: Soft. Bowel sounds are normal. He exhibits no distension. There is no tenderness. There is no rebound and no guarding.  Genitourinary: Rectum normal and prostate normal.  Musculoskeletal: He exhibits no edema.  Neurological: He is alert and oriented to person, place, and time.  Skin: Skin is warm and dry. He is not diaphoretic.  Psychiatric: He has a normal mood and affect. His behavior is normal. Judgment and thought content normal.          Assessment & Plan:

## 2010-06-21 NOTE — Assessment & Plan Note (Signed)
Due for labs

## 2010-06-21 NOTE — Assessment & Plan Note (Signed)
Resolved after PPIs

## 2010-06-21 NOTE — Assessment & Plan Note (Addendum)
Td 05 Had the shingles shot Pneumonia shot today Colonoscopy:   Date:  06/27/2005   Next Due:  06/2010---->   will contact GI   PSAs were   usually around  2.0, one time it went upto 4.0 ----> recheck a PSA yearly recommend to continue with his healthy life style, doing great with diet and exercise  EKG normal, no old the EKGs.

## 2010-06-21 NOTE — Assessment & Plan Note (Signed)
At some point, a PSA was increased, labs. Patient is asymptomatic from the urological standpoint

## 2010-08-12 ENCOUNTER — Encounter: Payer: Self-pay | Admitting: Gastroenterology

## 2010-08-21 ENCOUNTER — Encounter: Payer: Self-pay | Admitting: Gastroenterology

## 2010-09-19 ENCOUNTER — Ambulatory Visit (AMBULATORY_SURGERY_CENTER): Payer: Medicare Other | Admitting: *Deleted

## 2010-09-19 VITALS — Ht 66.0 in | Wt 180.0 lb

## 2010-09-19 DIAGNOSIS — Z1211 Encounter for screening for malignant neoplasm of colon: Secondary | ICD-10-CM

## 2010-09-19 MED ORDER — SUPREP BOWEL PREP KIT 17.5-3.13-1.6 GM/177ML PO SOLN: 1.0000 | ORAL | Status: DC

## 2010-10-03 ENCOUNTER — Encounter: Payer: Self-pay | Admitting: Gastroenterology

## 2010-10-03 ENCOUNTER — Ambulatory Visit (AMBULATORY_SURGERY_CENTER): Payer: Medicare Other | Admitting: Gastroenterology

## 2010-10-03 VITALS — BP 141/74 | HR 75 | Temp 98.1°F | Resp 21 | Ht 66.0 in | Wt 174.0 lb

## 2010-10-03 DIAGNOSIS — D126 Benign neoplasm of colon, unspecified: Secondary | ICD-10-CM

## 2010-10-03 DIAGNOSIS — Z1211 Encounter for screening for malignant neoplasm of colon: Secondary | ICD-10-CM

## 2010-10-03 MED ORDER — SODIUM CHLORIDE 0.9 % IV SOLN: 500.0000 mL | INTRAVENOUS | Status: DC

## 2010-10-03 NOTE — Patient Instructions (Signed)
Please read the handout that your recovery room gave you.    Your biopsy results will be mailed to you within two weeks.   Please, resume your routine medications today.  We will need to see you in 5 years for your next colonoscopy.    Thank-you for choosing [facility] for you health care needs today.

## 2010-10-04 ENCOUNTER — Telehealth: Payer: Self-pay

## 2010-10-04 NOTE — Telephone Encounter (Signed)

## 2010-10-21 ENCOUNTER — Encounter: Payer: Self-pay | Admitting: Adult Health

## 2010-10-21 ENCOUNTER — Ambulatory Visit (INDEPENDENT_AMBULATORY_CARE_PROVIDER_SITE_OTHER): Payer: Medicare Other | Admitting: Adult Health

## 2010-10-21 DIAGNOSIS — J069 Acute upper respiratory infection, unspecified: Secondary | ICD-10-CM | POA: Insufficient documentation

## 2010-10-21 MED ORDER — CEFDINIR 300 MG PO CAPS: 300.0000 mg | ORAL_CAPSULE | Freq: Two times a day (BID) | ORAL | Status: AC

## 2010-10-21 NOTE — Progress Notes (Signed)
  Subjective:    Patient ID: Rick Campbell, male    DOB: 08/04/1945, 65 y.o.   MRN: [id number]  HPI 65 yowm never smoker grew up with exposure to parents but no resp problems until age 82's and since then" every cold turns into to persistent cough to point of gagging" resolves p weeks with abx , cough meds and steroids  But in between always great for months with no rx.  Eval by allergy ([place]) Pos for dust/ molds and cats on shots x 4-5 years with no effect on this pattern so stopped in 1987,  referred to Pulmonary Clinic by Dr Drue Novel to address ? mech for cough.  05/08/2010 Initial pulmonary office eval cc all better now but wants to know how to prevent another recurrent cough p coughed from Nov2011 through March 2012 and tapering off nasal steroids.  No purulent secretions.  >>tx w/ gERD /rhinitis prevention   10/21/2010 Acute OV  Pt complains of Sinus drainage, prod cough (yellow- green) , Headaches for 2 weeks . Cough and congestion are getting worse. Sinus headache and congestion . OTC not helping. Pt increased his PPI and pepcid when cough started. Had done well until last 2 weeks.  No hemoptysis or chest pain .  No recent travel or abx use.   Review of Systems  Constitutional: Negative for fever, chills, activity change, appetite change and unexpected weight change.  HENT: Positive for rhinorrhea and postnasal drip. Negative for congestion, sore throat, sneezing, trouble swallowing, dental problem and voice change.   Eyes: Negative for visual disturbance.  Respiratory: Positive for cough . Negative for shortness of breath.   Cardiovascular: Negative for chest pain and leg swelling.  Gastrointestinal: Negative for nausea, vomiting and abdominal pain.  Genitourinary: Negative for difficulty urinating.  Musculoskeletal: Negative for arthralgias.  Skin: Negative for rash.  Psychiatric/Behavioral: Negative for behavioral problems and confusion.       Objective:   Physical Exam   pleasant amb wm nad  Wt 179 05/08/2010 >>184 10/21/2010   HEENT: nl dentition,  and orophanx. Nl external ear canals without cough reflex. Mild nonspecific turbinate edema   NECK :  without JVD/Nodes/TM/ nl carotid upstrokes bilaterally   LUNGS: no acc muscle use,  Coarse BS w/ no wheezing    CV:  RRR  no s3 or murmur or increase in P2, no edema   ABD:  soft and nontender with nl excursion in the supine position. No bruits or organomegaly, bowel sounds nl  MS:  warm without deformities, calf tenderness, cyanosis or clubbing  SKIN: warm and dry without lesions    NEURO:  alert, approp, no deficits     cxr 04/15/10 No active cardiopulmonary disease.  Assessment & Plan:

## 2010-10-21 NOTE — Patient Instructions (Signed)
Omnicef 300mg  Twice daily  For 10 days , take with food Mucinex DM Twice daily  As needed  Cough/congestion  Saline nasal rinses As needed   Continue on Aciphex Twice daily  And pepcid At bedtime  Until cough is resolved.  Please contact office for sooner follow up if symptoms do not improve or worsen or seek emergency care

## 2010-10-21 NOTE — Assessment & Plan Note (Signed)
URI w/ suspected sinusitis   Plan:  Omnicef 300mg  Twice daily  For 10 days , take with food Mucinex DM Twice daily  As needed  Cough/congestion  Saline nasal rinses As needed   Continue on Aciphex Twice daily  And pepcid At bedtime  Until cough is resolved.  Please contact office for sooner follow up if symptoms do not improve or worsen or seek emergency care

## 2010-10-28 ENCOUNTER — Telehealth: Payer: Self-pay | Admitting: Adult Health

## 2010-10-28 NOTE — Telephone Encounter (Signed)
I spoke with pt and he states his cough is more productive now. Pt states his phlem ranges from yellow to brown to green. Pt has 2 days left of the omnicef. Pt  States he had some left over hydromet but has ran out. Pt denies any fever, nausea, vomiting, sob, wheezing, chest tightness. Pt states his chest is sore from all the coughing. Pt is requesting further recs from TP. Please advise Tammy. Thanks  Allergies  Allergen Reactions  . Sulfonamide Derivatives Hives and Swelling    Carver Fila, CMA

## 2010-10-29 MED ORDER — HYDROCODONE-HOMATROPINE 5-1.5 MG/5ML PO SYRP: ORAL_SOLUTION | ORAL | Status: DC

## 2010-10-29 MED ORDER — CEFDINIR 300 MG PO CAPS: 300.0000 mg | ORAL_CAPSULE | Freq: Two times a day (BID) | ORAL | Status: DC

## 2010-10-29 NOTE — Telephone Encounter (Signed)
Can have Hydromet #8 oz 1-2 tsp every 6 hr As needed  Cough--may cause sedation/sleepiness -no refills Extend Omnicef 300mg  Twice daily  For 4 additional days. # 8 , no refills.  If not improving will need ov  Cont w/ mucinex dm and saline, fluids and rest  Please contact office for sooner follow up if symptoms do not improve or worsen or seek emergency care

## 2010-10-29 NOTE — Telephone Encounter (Signed)
PT CALLED BACK- STILL WAITING TO HEAR BACK RE: TP'S RECS. CALL PT AT [phone]. Hazel Sams

## 2010-10-29 NOTE — Telephone Encounter (Signed)
[email or web address]  

## 2010-10-29 NOTE — Telephone Encounter (Signed)
I spoke with pt and he is aware of TP's recs. Rx's have been called into pt's pharmacy

## 2010-11-04 ENCOUNTER — Other Ambulatory Visit (INDEPENDENT_AMBULATORY_CARE_PROVIDER_SITE_OTHER): Payer: Medicare Other

## 2010-11-04 ENCOUNTER — Telehealth: Payer: Self-pay | Admitting: Internal Medicine

## 2010-11-04 ENCOUNTER — Ambulatory Visit (INDEPENDENT_AMBULATORY_CARE_PROVIDER_SITE_OTHER): Payer: Medicare Other | Admitting: Internal Medicine

## 2010-11-04 ENCOUNTER — Encounter: Payer: Self-pay | Admitting: Internal Medicine

## 2010-11-04 VITALS — BP 110/70 | HR 76 | Temp 98.2°F | Ht 66.0 in | Wt 182.8 lb

## 2010-11-04 DIAGNOSIS — R059 Cough, unspecified: Secondary | ICD-10-CM

## 2010-11-04 DIAGNOSIS — R05 Cough: Secondary | ICD-10-CM

## 2010-11-04 LAB — CBC WITH DIFFERENTIAL/PLATELET
Basophils Absolute: 0 10*3/uL (ref 0.0–0.1)
Eosinophils Relative: 2.5 % (ref 0.0–5.0)
Monocytes Absolute: 0.5 10*3/uL (ref 0.1–1.0)
Monocytes Relative: 7.2 % (ref 3.0–12.0)
Neutrophils Relative %: 68.3 % (ref 43.0–77.0)
Platelets: 193 10*3/uL (ref 150.0–400.0)
RDW: 13.4 % (ref 11.5–14.6)
WBC: 6.3 10*3/uL (ref 4.5–10.5)

## 2010-11-04 MED ORDER — AMOXICILLIN-POT CLAVULANATE 875-125 MG PO TABS: 1.0000 | ORAL_TABLET | Freq: Two times a day (BID) | ORAL | Status: AC

## 2010-11-04 MED ORDER — BUDESONIDE-FORMOTEROL FUMARATE 80-4.5 MCG/ACT IN AERO: 2.0000 | INHALATION_SPRAY | Freq: Two times a day (BID) | RESPIRATORY_TRACT | Status: DC

## 2010-11-04 MED ORDER — PREDNISONE (PAK) 10 MG PO TABS: ORAL_TABLET | ORAL | Status: AC

## 2010-11-04 MED ORDER — TRAMADOL HCL 50 MG PO TABS: ORAL_TABLET | ORAL | Status: AC

## 2010-11-04 NOTE — Telephone Encounter (Signed)
Call patient : Study is unremarkable, no change in recs   I spoke with patient about results and he verbalized understanding and had no questions  

## 2010-11-04 NOTE — Progress Notes (Signed)
  Subjective:    Patient ID: Rick Campbell, male    DOB: [birth year], 65 y.o.   MRN: [id number]  HPI 57  yowm never smoker grew up with exposure to parents but no resp problems until age 36's and since then" every cold turns into to persistent cough to point of "gagging" resolves p weeks with abx , cough meds and steroids  But in between always great for months with no rx.  Eval by allergy ([place]) Pos for dust/ molds and cats on shots x 4-5 years with no effect on this pattern so stopped in 1987,  referred to Pulmonary Clinic by Dr Drue Novel to address ? mech for cough.  05/08/2010 Initial pulmonary office eval cc all better now but wants to know how to prevent another recurrent cough p coughed from Nov2011 through March 2012 and tapering off nasal steroids.  No purulent secretions.  >>tx w/ gERD /rhinitis prevention   10/21/2010 Acute OV / NP completely better until acute onset x 2 weeks with Sinus drainage, prod cough (yellow- green) Headaches for 2 weeks . Cough and congestion are getting worse. Sinus headache and congestion . OTC not helping. Pt increased his PPI and pepcid when cough started.  rec  Omnicef 300mg  Twice daily  For 10 days , take with food Mucinex DM Twice daily  As needed  Cough/congestion  Saline nasal rinses As needed   Continue on Aciphex Twice daily  And pepcid At bedtime  Until cough is resolved.    11/04/2010 f/u ov/Tallula Grindle cc just finished the omnicef 10/7 weems to be getting worse with brown mucus now but minimal sob, seems worse in early am.  Denies ever using saba, compliant with ppi and h2 hs.   . Also denies any obvious fluctuation of symptoms with weather or environmental changes or other aggravating or alleviating factors except as outlined above   ROS  At present neg for  any significant sore throat, dysphagia, itching, sneezing,  nasal congestion or excess/ purulent secretions,  fever, chills, sweats, unintended wt loss, pleuritic or exertional cp, hempoptysis, orthopnea  pnd or leg swelling.  Also denies presyncope, palpitations, heartburn, abdominal pain, nausea, vomiting, diarrhea  or change in bowel or urinary habits, dysuria,hematuria,  rash, arthralgias, visual complaints, headache, numbness weakness or ataxia.          Objective:   Physical Exam    pleasant amb wm nad  Wt 179 05/08/2010 >>184 10/21/2010 > 11/04/2010 182  HEENT: nl dentition,  and orophanx. Nl external ear canals without cough reflex. Mild nonspecific turbinate edema   NECK :  without JVD/Nodes/TM/ nl carotid upstrokes bilaterally   LUNGS: insp and exp rhonchi with exp coughing   CV:  RRR  no s3 or murmur or increase in P2, no edema   ABD:  soft and nontender with nl excursion in the supine position. No bruits or organomegaly, bowel sounds nl  MS:  warm without deformities, calf tenderness, cyanosis or clubbing     cxr 04/15/10 No active cardiopulmonary disease.  Assessment & Plan:

## 2010-11-04 NOTE — Patient Instructions (Signed)
Symbicort 80  Take 2 puffs first thing in am and then another 2 puffs about 12 hours later.    Work on inhaler technique:  relax and gently blow all the way out then take a nice smooth deep breath back in, triggering the inhaler at same time you start breathing in.  Hold for up to 5 seconds if you can.  Rinse and gargle with water when done   If your mouth or throat starts to bother you,   I suggest you time the inhaler to your dental care and after using the inhaler(s) brush teeth and tongue with a baking soda containing toothpaste and when you rinse this out, gargle with it first to see if this helps your mouth and throat.    Prednisone 10 mg take  4 each am x 2 days,   2 each am x 2 days,  1 each am x2days and stop  Augmentin x 10 days twice daily with large glass of water and yogurt for luch  Please schedule a follow up office visit in 2 weeks, sooner if needed

## 2010-11-04 NOTE — Assessment & Plan Note (Addendum)
Flare refractory to abx and max gerd rx  The most common causes of chronic cough in immunocompetent adults include the following: upper airway cough syndrome (UACS), previously referred to as postnasal drip syndrome (PNDS), which is caused by variety of rhinosinus conditions; (2) asthma; (3) GERD; (4) chronic bronchitis from cigarette smoking or other inhaled environmental irritants; (5) nonasthmatic eosinophilic bronchitis; and (6) bronchiectasis.   These conditions, singly or in combination, have accounted for up to 94% of the causes of chronic cough in prospective studies.   Other conditions have constituted no >6% of the causes in prospective studies These have included bronchogenic carcinoma, chronic interstitial pneumonia, sarcoidosis, left ventricular failure, ACEI-induced cough, and aspiration from a condition associated with pharyngeal dysfunction.  ? Could this be cough variant asthma assoc with rhinitis, eosinophic bronchitis?  .Chronic cough is often simultaneously caused by more than one condition. A single cause has been found from 38 to 82% of the time, multiple causes from 18 to 62%. Multiply caused cough has been the result of three diseases up to 42% of the time.   See instructions for specific recommendations which were reviewed directly with the patient who was given a copy with highlighter outlining the key components.   The proper method of use, as well as anticipated side effects, of this metered-dose inhaler are discussed and demonstrated to the patient. Improved to 75% with coaching

## 2010-11-05 ENCOUNTER — Encounter: Payer: Self-pay | Admitting: Internal Medicine

## 2010-11-05 LAB — ALLERGY PROFILE REGION II-DC, DE, MD, ~~LOC~~, VA
Allergen, D pternoyssinus,d7: 1.3 kU/L — ABNORMAL HIGH (ref <0.35)
Alternaria Alternata: <0.1 kU/L (ref <0.35)
Bermuda Grass: <0.1 kU/L (ref <0.35)
Cladosporium Herbarum: <0.1 kU/L (ref <0.35)
Cockroach: <0.1 kU/L (ref <0.35)
Common Ragweed: <0.1 kU/L (ref <0.35)
Dog Dander: 0.16 kU/L (ref <0.35)
Elm IgE: <0.1 kU/L (ref <0.35)
IgE (Immunoglobulin E), Serum: 49.7 IU/mL (ref 0.0–180.0)
Johnson Grass: <0.1 kU/L (ref <0.35)
Meadow Grass: <0.1 kU/L (ref <0.35)
Pecan/Hickory Tree IgE: <0.1 kU/L (ref <0.35)

## 2010-11-06 ENCOUNTER — Ambulatory Visit (INDEPENDENT_AMBULATORY_CARE_PROVIDER_SITE_OTHER): Payer: Medicare Other | Admitting: Internal Medicine

## 2010-11-06 ENCOUNTER — Telehealth: Payer: Self-pay | Admitting: Internal Medicine

## 2010-11-06 ENCOUNTER — Encounter: Payer: Self-pay | Admitting: Internal Medicine

## 2010-11-06 DIAGNOSIS — R972 Elevated prostate specific antigen [PSA]: Secondary | ICD-10-CM

## 2010-11-06 DIAGNOSIS — T148XXA Other injury of unspecified body region, initial encounter: Secondary | ICD-10-CM | POA: Insufficient documentation

## 2010-11-06 DIAGNOSIS — S5000XA Contusion of unspecified elbow, initial encounter: Secondary | ICD-10-CM

## 2010-11-06 NOTE — Assessment & Plan Note (Signed)
Hematoma w/o evidence of bicipital or tricipital muscle tear. No evidence of gout or joint involvement. Recommend observation. Will call if not completely well in 2-3 weeks, ice and rest are recommend

## 2010-11-06 NOTE — Progress Notes (Signed)
  Subjective:    Patient ID: Rick Campbell, male    DOB: 10/19/1945, 65 y.o.   MRN: [id number]  HPI 3 days ago developed a mass in the right elbow while he was playing several rounds of golf. The mass was about 4-5 cm, nontender; since then it has decreased in size to half, has noticed some bruising today .  Past Medical History  Diagnosis Date  . Esophageal reflux   . Chronic headache   . Hyperlipidemia   . Allergy     seasonal  . Adenomatous polyp of colon    Past Surgical History  Procedure Date  . Cholecystectomy 1998  . Colonoscopy   . Upper gastrointestinal endoscopy      Review of Systems No fever or chills No direct injury No pain in the arm or motion limitations    Objective:   Physical Exam  Constitutional: He appears well-developed and well-nourished. No distress.  Musculoskeletal:       Arms:      Left arm normal. Right arm, see graphic. Elbow is full range of motion without evidence of effusion. Biceps and triceps normal to palpation. Vascular exam completely normal. No warmness anywhere.   Skin: He is not diaphoretic.          Assessment & Plan:

## 2010-11-06 NOTE — Telephone Encounter (Signed)
     Call patient : Study is remarkable for allergy to mold (hard to avoid) and dog dander (easier) / otherwise ok, will discuss details at next ov   I spoke with patient about results and he verbalized understanding and had no questions

## 2010-11-06 NOTE — Assessment & Plan Note (Signed)
Recent PSA review along with all the other labs. Will back in November for a PSA rechecked.

## 2010-11-06 NOTE — Patient Instructions (Addendum)
Came back in November for a PSA dx : elevated PSA Call if the elbow is not back to normal in 2-3 weeks

## 2010-11-20 ENCOUNTER — Ambulatory Visit: Payer: Medicare Other | Admitting: Internal Medicine

## 2010-11-26 ENCOUNTER — Other Ambulatory Visit: Payer: Self-pay | Admitting: Internal Medicine

## 2010-11-26 DIAGNOSIS — R972 Elevated prostate specific antigen [PSA]: Secondary | ICD-10-CM

## 2010-11-27 ENCOUNTER — Other Ambulatory Visit (INDEPENDENT_AMBULATORY_CARE_PROVIDER_SITE_OTHER): Payer: Medicare Other

## 2010-11-27 ENCOUNTER — Other Ambulatory Visit: Payer: Self-pay | Admitting: Internal Medicine

## 2010-11-27 DIAGNOSIS — R972 Elevated prostate specific antigen [PSA]: Secondary | ICD-10-CM

## 2010-11-27 LAB — PSA: PSA: 3.54 ng/mL (ref 0.10–4.00)

## 2010-11-27 NOTE — Progress Notes (Signed)
Labs only

## 2010-11-28 ENCOUNTER — Ambulatory Visit (INDEPENDENT_AMBULATORY_CARE_PROVIDER_SITE_OTHER): Payer: Medicare Other | Admitting: Internal Medicine

## 2010-11-28 ENCOUNTER — Encounter: Payer: Self-pay | Admitting: Internal Medicine

## 2010-11-28 DIAGNOSIS — R05 Cough: Secondary | ICD-10-CM

## 2010-11-28 MED ORDER — MOMETASONE FUROATE 50 MCG/ACT NA SUSP: 2.0000 | Freq: Every day | NASAL | Status: DC

## 2010-11-28 MED ORDER — BUDESONIDE-FORMOTEROL FUMARATE 80-4.5 MCG/ACT IN AERO: 2.0000 | INHALATION_SPRAY | Freq: Two times a day (BID) | RESPIRATORY_TRACT | Status: DC

## 2010-11-28 NOTE — Progress Notes (Signed)
Subjective:    Patient ID: Rick Campbell, male    DOB: Apr 08, 1945, 65 y.o.   MRN: [id number]  HPI 58  yowm never smoker grew up with exposure to parents but no resp problems until age 58's and since then" every cold turns into to persistent cough to point of "gagging" resolves p weeks with abx , cough meds and steroids  But in between always great for months with no rx.  Eval by allergy ([place]) Pos for dust/ molds and cats on shots x 4-5 years with no effect on this pattern so stopped in 1987,  referred to Pulmonary Clinic by Dr Drue Novel to address ? mech for cough.  05/08/2010 Initial pulmonary office eval cc all better now but wants to know how to prevent another recurrent cough p coughed from Nov2011 through March 2012 and tapering off nasal steroids.  No purulent secretions.  >>tx w/ GERD /rhinitis prevention   10/21/2010 Acute OV / NP completely better until acute onset x 2 weeks with Sinus drainage, prod cough (yellow- green) Headaches for 2 weeks . Cough and congestion are getting worse. Sinus headache and congestion . OTC not helping. Pt increased his PPI and pepcid when cough started.  rec  Omnicef 300mg  Twice daily  For 10 days , take with food Mucinex DM Twice daily  As needed  Cough/congestion  Saline nasal rinses As needed   Continue on Aciphex Twice daily  And pepcid At bedtime  Until cough is resolved.    11/04/2010 f/u ov/Rick Campbell cc just finished the omnicef 10/7 weems to be getting worse with brown mucus now but minimal sob, seems worse in early am.  Denies ever using saba, compliant with ppi and h2 hs. rec Symbicort 80  Take 2 puffs first thing in am and then another 2 puffs about 12 hours later.  Work on inhaler technique:  .   Prednisone 10 mg take  4 each am x 2 days,   2 each am x 2 days,  1 each am x2days and stop Augmentin x 10 day    11/28/2010 f/u ov/Rick Campbell cc minimal sinus drainage overall 95% improved on symbicort, sleeping fine, no sob or productive cough. Has used omnaris  before for sinus drainge and not sure it helped but can't get it any more.    Also denies any obvious fluctuation of symptoms with weather or environmental changes or other aggravating or alleviating factors except as outlined above   ROS  At present neg for  any significant sore throat, dysphagia, itching, sneezing,  nasal congestion or excess/ purulent secretions,  fever, chills, sweats, unintended wt loss, pleuritic or exertional cp, hempoptysis, orthopnea pnd or leg swelling.  Also denies presyncope, palpitations, heartburn, abdominal pain, nausea, vomiting, diarrhea  or change in bowel or urinary habits, dysuria,hematuria,  rash, arthralgias, visual complaints, headache, numbness weakness or ataxia.          Objective:   Physical Exam    pleasant amb wm nad  Wt 179 05/08/2010 >>184 10/21/2010 > 11/04/2010 182 > 11/28/2010  178   HEENT: nl dentition,  and orophanx. Nl external ear canals without cough reflex. Mild nonspecific turbinate edema   NECK :  without JVD/Nodes/TM/ nl carotid upstrokes bilaterally   LUNGS: insp and exp rhonchi with exp coughing   CV:  RRR  no s3 or murmur or increase in P2, no edema   ABD:  soft and nontender with nl excursion in the supine position. No bruits or organomegaly, bowel sounds  nl  MS:  warm without deformities, calf tenderness, cyanosis or clubbing     cxr 04/15/10 No active cardiopulmonary disease.  Assessment & Plan:

## 2010-11-28 NOTE — Patient Instructions (Signed)
I emphasized that nasal steroids have no immediate benefit in terms of improving symptoms.  To help them reached the target tissue, the patient should use Afrin two puffs every 12 hours applied one min before using the nasal steroids.  Afrin should be stopped after no more than 5 days.  If the symptoms worsen, Afrin can be restarted after 5 days off of therapy to prevent rebound congestion from overuse of Afrin.  I also emphasized that in no way are nasal steroids a concern in terms of "addiction".   Symbicort 80 can be used 2 puffs every 12 hours as needed   If you are satisfied with your treatment plan let your doctor know and he/she can either refill your medications or you can return here when your prescription runs out.     If in any way you are not 100% satisfied,  please tell us.  If 100% better, tell your friends!

## 2010-11-29 ENCOUNTER — Encounter: Payer: Self-pay | Admitting: Internal Medicine

## 2010-11-29 NOTE — Assessment & Plan Note (Signed)
This is most c/w  Classic Upper airway cough syndrome, so named because it's frequently impossible to sort out how much is  CR/sinusitis with freq throat clearing (which can be related to primary GERD)   vs  causing  secondary (" extra esophageal")  GERD from wide swings in gastric pressure that occur with throat clearing, often  promoting self use of mint and menthol lozenges that reduce the lower esophageal sphincter tone and exacerbate the problem further in a cyclical fashion.   These are the same pts who not infrequently have failed to tolerate ace inhibitors,  dry powder inhalers or biphosphonates or report having reflux symptoms that don't respond to standard doses of PPI , and are easily confused as having aecopd or asthma flares,   However,  chronic cough is often simultaneously caused by more than one condition. A single cause has been found from 38 to 82% of the time, multiple causes from 18 to 62%. Multiply caused cough has been the result of three diseases up to 42% of the time.  This may be a combination of rhinitis and asthma but if so it may well be better control of rhinitis will elminate all the coughing.  See instructions for specific recommendations which were reviewed directly with the patient who was given a copy with highlighter outlining the key components.

## 2010-12-03 ENCOUNTER — Telehealth: Payer: Self-pay

## 2010-12-03 NOTE — Telephone Encounter (Signed)
Message copied by Francisco Capuchin on Tue Dec 03, 2010 11:45 AM ------      Message from: Rick Campbell      Created: Sun Dec 01, 2010 10:01 AM       Advise patient:      PSA has decreased to a normal level, rec to recheck in 6 months (or at time of his CPX)

## 2011-01-17 ENCOUNTER — Other Ambulatory Visit: Payer: Self-pay | Admitting: Internal Medicine

## 2011-03-26 ENCOUNTER — Ambulatory Visit (INDEPENDENT_AMBULATORY_CARE_PROVIDER_SITE_OTHER): Payer: Medicare Other | Admitting: Internal Medicine

## 2011-03-26 VITALS — BP 138/82 | HR 93 | Temp 97.8°F | Wt 183.0 lb

## 2011-03-26 DIAGNOSIS — J40 Bronchitis, not specified as acute or chronic: Secondary | ICD-10-CM | POA: Diagnosis not present

## 2011-03-26 MED ORDER — DOXYCYCLINE HYCLATE 100 MG PO TABS: 100.0000 mg | ORAL_TABLET | Freq: Two times a day (BID) | ORAL | Status: AC

## 2011-03-26 NOTE — Progress Notes (Signed)
  Subjective:    Patient ID: Rick Campbell, male    DOB: 26-Apr-1945, 66 y.o.   MRN: [id number]  HPI Acute visit 10 days ago developed a "chest cold": Reports chest congestion, cough with brown sputum. This morning he saw small amounts of what seems to be blood described as small specs.  Past Medical History  Diagnosis Date  . Esophageal reflux   . Chronic headache   . Hyperlipidemia   . Allergy     seasonal  . Adenomatous polyp of colon    Past Surgical History  Procedure Date  . Cholecystectomy 1998  . Colonoscopy   . Upper gastrointestinal endoscopy       Review of Systems No fever or chills No shortness of breath or chest pain other than the soreness from coughing. No nausea, vomiting or diarrhea. Some sneezing in the morning.     Objective:   Physical Exam  Alert, oriented x3, no apparent distress. Ears normal, nose not congested, throat normal. Face symmetric Lungs: Very few rhonchi otherwise clear Cardiovascular regular rate and rhythm without a murmur      Assessment & Plan:  Bronchitis, exam showed no crackles or distress, small amount of hemoptysis likely from bronchitis. See instructions. Patient knows to call me if hemoptysis gets worse or persistent.

## 2011-03-26 NOTE — Patient Instructions (Signed)
Rest, fluids , tylenol For cough, take Mucinex DM twice a day as needed  symbicort twice a day Take the antibiotic as prescribed ----> doxycycline x 7 days Call if no better in few days Call anytime if the symptoms are sever

## 2011-03-27 ENCOUNTER — Encounter: Payer: Self-pay | Admitting: Internal Medicine

## 2011-07-07 ENCOUNTER — Encounter: Payer: Medicare Other | Admitting: Internal Medicine

## 2011-07-11 ENCOUNTER — Encounter: Payer: Self-pay | Admitting: Internal Medicine

## 2011-07-11 ENCOUNTER — Ambulatory Visit (INDEPENDENT_AMBULATORY_CARE_PROVIDER_SITE_OTHER): Payer: Medicare Other | Admitting: Internal Medicine

## 2011-07-11 VITALS — BP 120/72 | HR 72 | Temp 98.0°F | Ht 65.75 in | Wt 176.0 lb

## 2011-07-11 DIAGNOSIS — R05 Cough: Secondary | ICD-10-CM | POA: Diagnosis not present

## 2011-07-11 DIAGNOSIS — E785 Hyperlipidemia, unspecified: Secondary | ICD-10-CM

## 2011-07-11 DIAGNOSIS — K219 Gastro-esophageal reflux disease without esophagitis: Secondary | ICD-10-CM | POA: Diagnosis not present

## 2011-07-11 DIAGNOSIS — Z0000 Encounter for general adult medical examination without abnormal findings: Secondary | ICD-10-CM

## 2011-07-11 DIAGNOSIS — R972 Elevated prostate specific antigen [PSA]: Secondary | ICD-10-CM

## 2011-07-11 DIAGNOSIS — R059 Cough, unspecified: Secondary | ICD-10-CM

## 2011-07-11 LAB — BASIC METABOLIC PANEL
BUN: 13 mg/dL (ref 6–23)
Calcium: 9.4 mg/dL (ref 8.4–10.5)
GFR: 82.27 mL/min (ref >60.00)
Glucose, Bld: 88 mg/dL (ref 70–99)

## 2011-07-11 LAB — LDL CHOLESTEROL, DIRECT: Direct LDL: 154.6 mg/dL

## 2011-07-11 MED ORDER — MOMETASONE FUROATE 50 MCG/ACT NA SUSP: 2.0000 | Freq: Every day | NASAL | Status: DC

## 2011-07-11 MED ORDER — RABEPRAZOLE SODIUM 20 MG PO TBEC: 20.0000 mg | DELAYED_RELEASE_TABLET | Freq: Every day | ORAL | Status: DC

## 2011-07-11 NOTE — Assessment & Plan Note (Signed)
Asymptomatic except for nocturia x1, DRE normal. Labs

## 2011-07-11 NOTE — Patient Instructions (Signed)
Symbicort and Pepcid can be taken as needed.

## 2011-07-11 NOTE — Assessment & Plan Note (Signed)
GERD manifestation is cough, does not have classic heartburn. Continue with PPIs

## 2011-07-11 NOTE — Assessment & Plan Note (Signed)
Td 05 Had the shingles shot Pneumonia shot 2012 Colonoscopy:  , 06/27/2005 , 09-2010---->  1 polyp, next in 5 years  PSAs were   usually around  2.0, one time it went upto 4.0 ----> recheck a PSA  recommend to continue with his healthy life style, doing great with diet and exercise

## 2011-07-11 NOTE — Assessment & Plan Note (Signed)
Continue with PPIs, Nasonex. Change Symbicort as needed

## 2011-07-11 NOTE — Assessment & Plan Note (Signed)
On diet control only, labs  

## 2011-07-11 NOTE — Progress Notes (Signed)
  Subjective:    Patient ID: Rick Campbell, male    DOB: [birth year], 66 y.o.   MRN: [id number]  HPI  Here for Medicare AWV: 1. Risk factors based on Past M, S, F history: reviewed 2. Physical Activities:  Very active , [employer] x 4-5 times a week   3. Depression/mood:  No problems noted or reported   4. Hearing:  Saw ENT, some hearing loss, tinnitus--->  Stable  5. ADL's:  Independent   6. Fall Risk: no recent falls, prevention discussed 7. home Safety: does feelsafe at home   8. Height, weight, &visual acuity: see VS, vision well corrected w/ glasses   9. Counseling: provided 10. Labs ordered based on risk factors: if needed   11. Referral Coordination: if needed 12.  Care Plan, see assessment and plan   13.   Cognitive Assessment:motor skills and cognition wnl    In addition we discussed the following: GERD, manifested usually is cough, currently well controlled with PPIs and occasional Pepcid. Cough: Currently well controlled with GERD treatment as well as Symbicort, nasal steroids..  Past Medical History  Diagnosis Date  . Esophageal reflux   . Chronic headache   . Hyperlipidemia   . Allergy     seasonal  . Adenomatous polyp of colon    Past Surgical History  Procedure Date  . Cholecystectomy 1998  . Colonoscopy   . Upper gastrointestinal endoscopy     Family History:  Fa , deceased at age ~ 66y/o  CAD - F (MI, CABG age 25 - CHF)  HTN - M  Stroke - no  DM - F  colon Ca - F  lung Ca - M +smoker  prostate Ca - no  Social History:  Married , 2 kids  retired from [employer]  Never Smoked  Alcohol use-yes (1-2 glasses wine every week)  Regular exercise-yes (4-5 x wk)  diet-- trying to eat healthy   Review of Systems No chest pain or shortness or breath No nausea, vomiting, diarrhea. No blood in the stools. Occasional nocturia, usually once at night, no difficulty urinating.     Objective:   Physical Exam  General -- alert, well-developed, and  well-nourished.   Neck --no thyromegaly , normal carotid pulse, no carotid bruit Lungs -- normal respiratory effort, no intercostal retractions, no accessory muscle use, and normal breath sounds.   Heart-- normal rate, regular rhythm, no murmur, and no gallop.   Abdomen--soft, non-tender, no distention, no masses, no HSM, no guarding, and no rigidity.  No bruit Extremities-- no pretibial edema bilaterally Rectal-- No external abnormalities noted. Normal sphincter tone. No rectal masses or tenderness.  Prostate:  Prostate gland firm and smooth, no enlargement, nodularity, tenderness, mass, asymmetry or induration. Neurologic-- alert & oriented X3 and strength normal in all extremities. Psych-- Cognition and judgment appear intact. Alert and cooperative with normal attention span and concentration.  not anxious appearing and not depressed appearing.      Assessment & Plan:

## 2011-07-20 ENCOUNTER — Encounter: Payer: Self-pay | Admitting: Internal Medicine

## 2011-07-22 ENCOUNTER — Telehealth: Payer: Self-pay | Admitting: Internal Medicine

## 2011-07-22 MED ORDER — ATORVASTATIN CALCIUM 10 MG PO TABS: 10.0000 mg | ORAL_TABLET | Freq: Every day | ORAL | Status: DC

## 2011-07-22 NOTE — Telephone Encounter (Signed)
Sent in rx.

## 2011-07-22 NOTE — Telephone Encounter (Signed)
Please send a prescription for Lipitor 10 mg one by mouth each bedtime #30 and 4 refills Needs labs in 6 weeks FLP, AST, ALT DX hyperlipidemia. Patient already aware of all of the above, no need to call him

## 2011-08-05 ENCOUNTER — Telehealth: Payer: Self-pay | Admitting: Internal Medicine

## 2011-08-05 MED ORDER — OMEPRAZOLE 20 MG PO CPDR: DELAYED_RELEASE_CAPSULE | ORAL | Status: DC

## 2011-08-05 NOTE — Telephone Encounter (Signed)
We can try omeprazole 20 mg one by mouth daily before breakfast

## 2011-08-05 NOTE — Telephone Encounter (Signed)
Please advise 

## 2011-08-05 NOTE — Telephone Encounter (Signed)
Pharmacy comments: This medication is not covered. Price is over $300. Can Aciphex be switched to something else? And pt is requesting #90.

## 2011-08-05 NOTE — Telephone Encounter (Signed)
Sent in rx.

## 2011-08-10 IMAGING — CT CT PARANASAL SINUSES LIMITED
1 of 3 series · 12 of 30 positions shown, 15 images · non-contrast
Comparison: Radiographic sinus series 04/17/2003.

CLINICAL DATA: 64-year-old male with cough and drainage.  Face
tenderness in the maxillary and frontal regions.

CT PARANASAL SINUSES WITHOUT CONTRAST
TECHNIQUE: Multidetector CT through the paranasal sinuses was
performed using the standard protocol without intravenous contrast.

[Series 4: ltd sinus 3.0 h30s · axial · 0.29mm/px · z∈[-91,+22]mm · 12 of 21 slices shown, 15 images]
[im 2/21  brain]
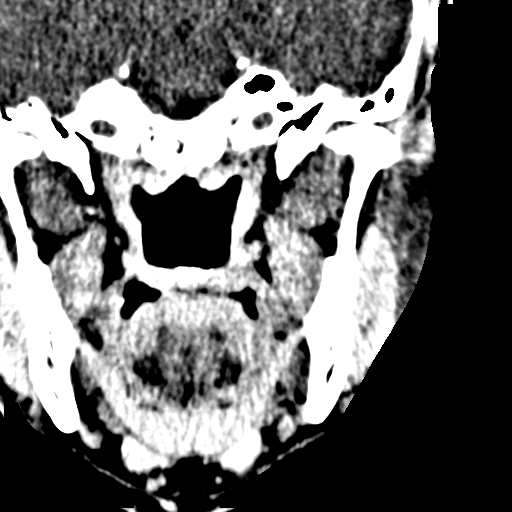
[im 2/21  bone]
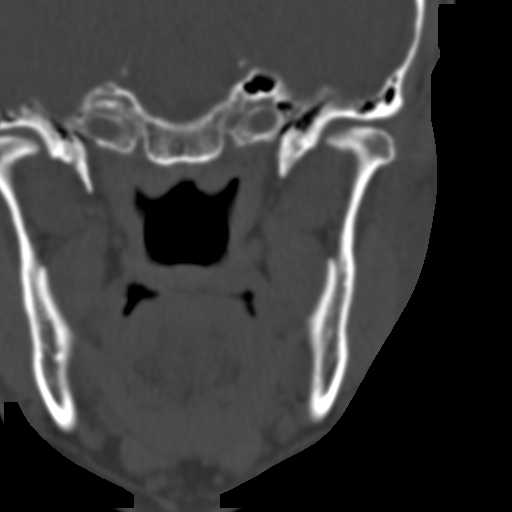
[im 4/21  bone]
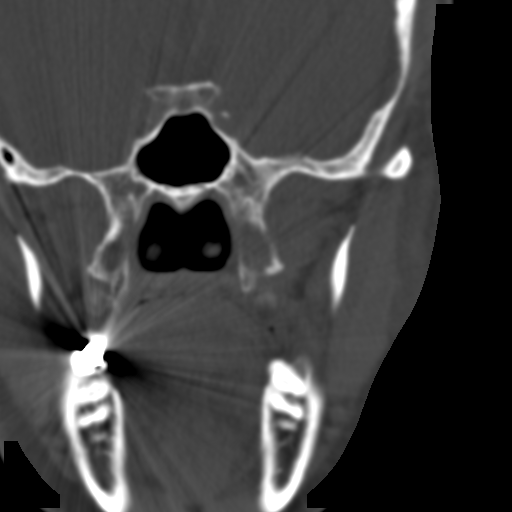
[im 5/21  bone]
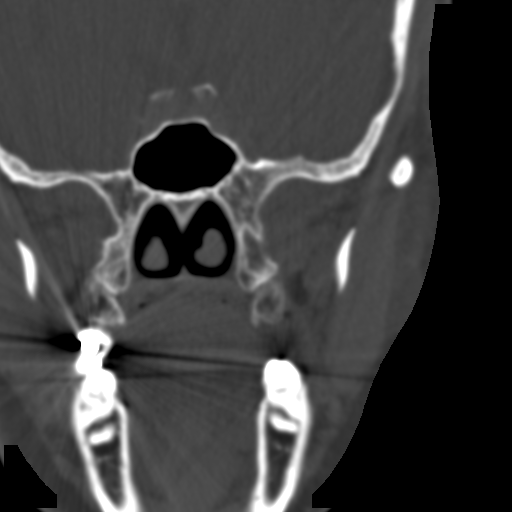
[im 7/21  bone]
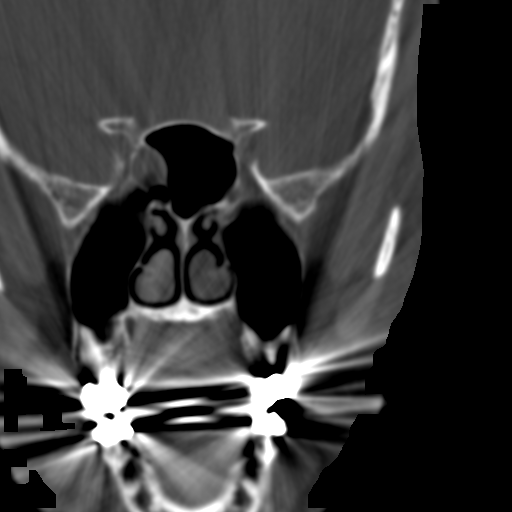
[im 8/21  brain]
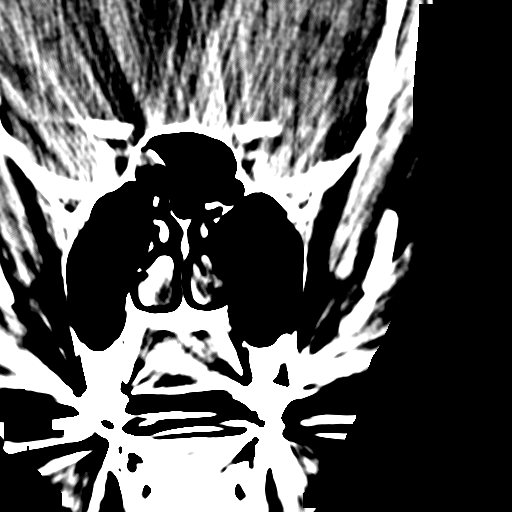
[im 8/21  bone]
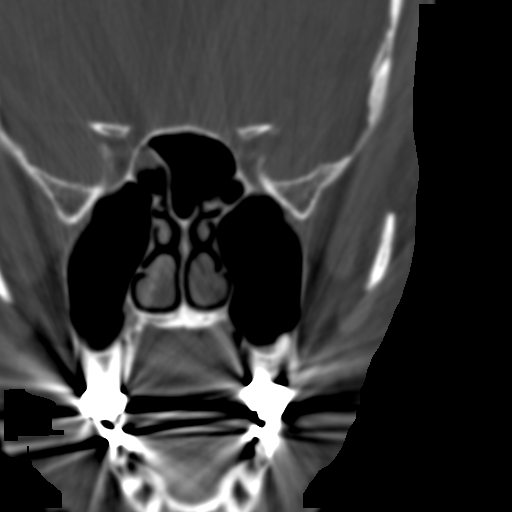
[im 10/21  bone]
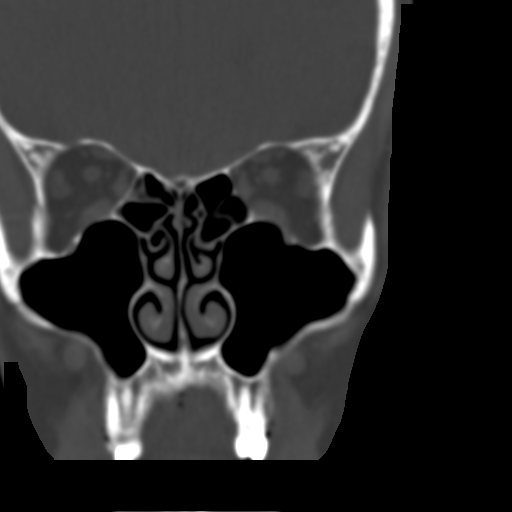
[im 11/21  bone]
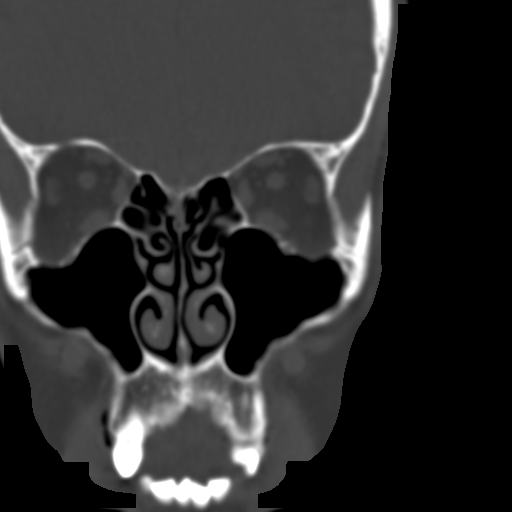
[im 13/21  bone]
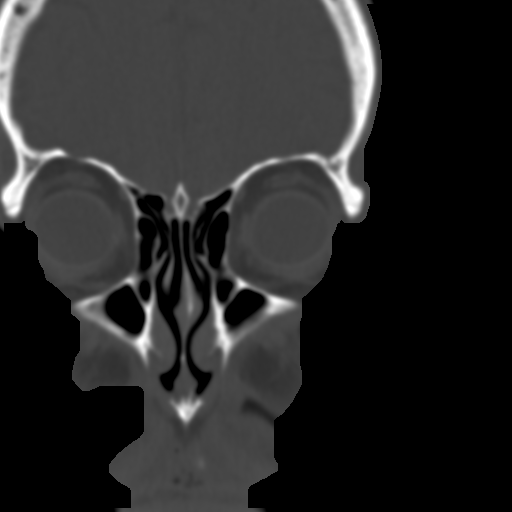
[im 14/21  brain]
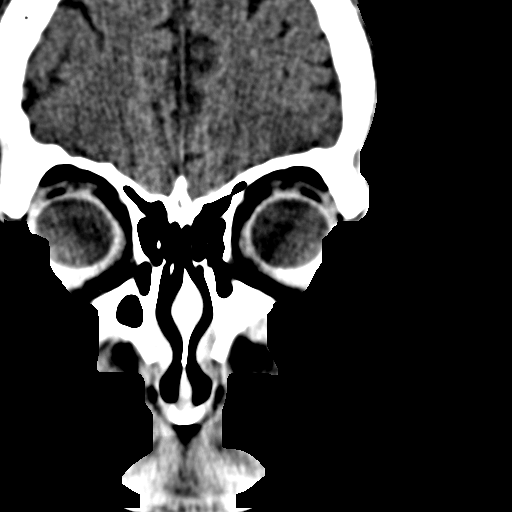
[im 14/21  bone]
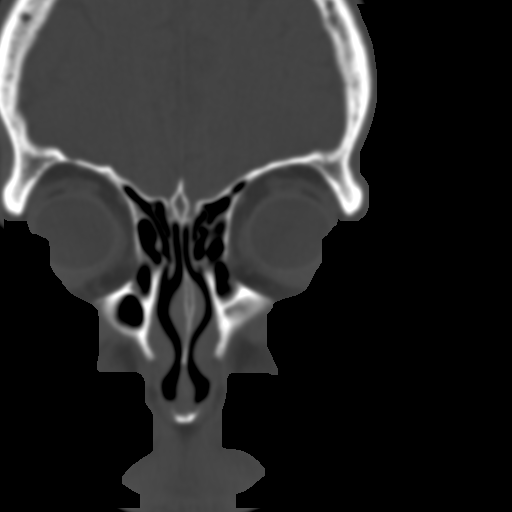
[im 16/21  bone]
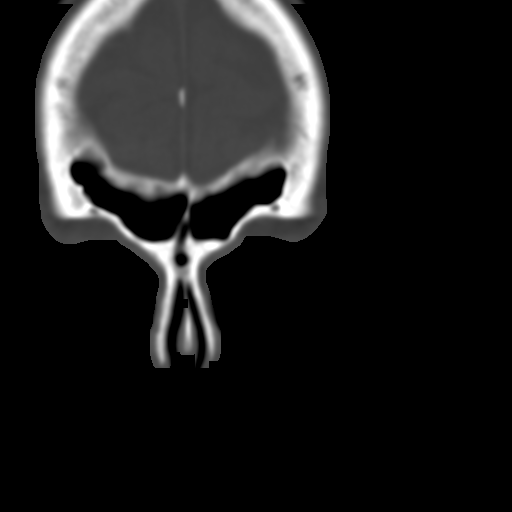
[im 17/21  bone]
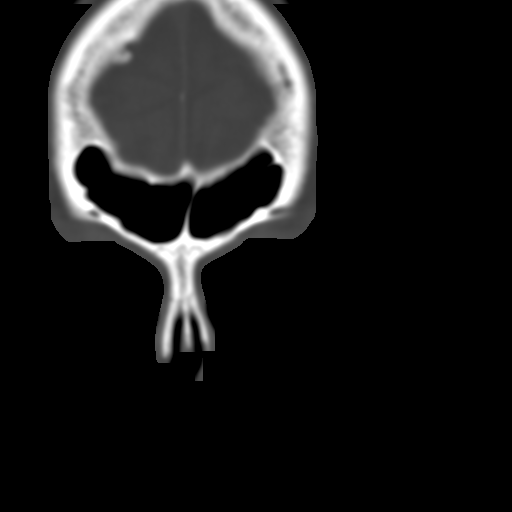
[im 19/21  bone]
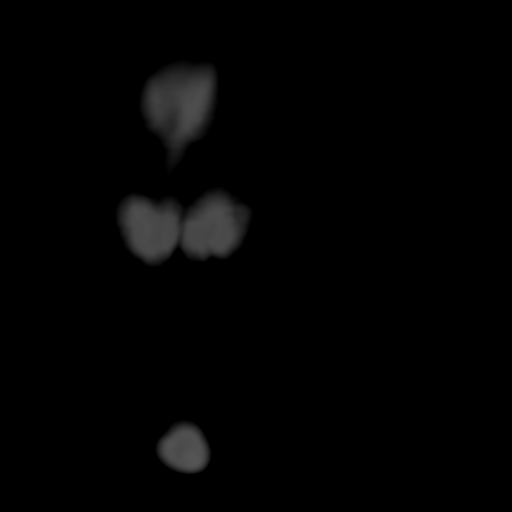

[12 of 30 positions shown; findings below may reference images not displayed]

FINDINGS: The paranasal sinuses are clear except for mild mucosal
thickening in the hypoplastic right sphenoid, of doubtful clinical
significance.  Visualized mastoid and petrous apex air cells are
clear.

No acute osseous abnormality identified.

Visualized face, orbits, and pharyngeal soft tissues are within
normal limits.  Grossly normal visualized noncontrast brain
parenchyma.
IMPRESSION: Negative paranasal sinuses.

## 2011-09-24 ENCOUNTER — Other Ambulatory Visit (INDEPENDENT_AMBULATORY_CARE_PROVIDER_SITE_OTHER): Payer: Medicare Other

## 2011-09-24 DIAGNOSIS — E785 Hyperlipidemia, unspecified: Secondary | ICD-10-CM

## 2011-09-24 LAB — LIPID PANEL
Cholesterol: 167 mg/dL (ref 0–200)
HDL: 59.5 mg/dL (ref >39.00)
VLDL: 12.4 mg/dL (ref 0.0–40.0)

## 2011-09-24 NOTE — Progress Notes (Signed)
Labs only

## 2011-09-27 ENCOUNTER — Encounter: Payer: Self-pay | Admitting: Internal Medicine

## 2012-01-13 ENCOUNTER — Other Ambulatory Visit: Payer: Self-pay | Admitting: Internal Medicine

## 2012-01-13 NOTE — Telephone Encounter (Signed)
Refill done.  

## 2012-02-23 ENCOUNTER — Other Ambulatory Visit: Payer: Self-pay | Admitting: Internal Medicine

## 2012-02-23 NOTE — Telephone Encounter (Signed)
Refill done.  

## 2012-02-26 DIAGNOSIS — H33319 Horseshoe tear of retina without detachment, unspecified eye: Secondary | ICD-10-CM | POA: Diagnosis not present

## 2012-03-13 ENCOUNTER — Other Ambulatory Visit: Payer: Self-pay

## 2012-03-22 ENCOUNTER — Ambulatory Visit (INDEPENDENT_AMBULATORY_CARE_PROVIDER_SITE_OTHER): Payer: Medicare Other | Admitting: Internal Medicine

## 2012-03-22 VITALS — BP 118/74 | HR 72 | Temp 98.0°F | Wt 187.0 lb

## 2012-03-22 DIAGNOSIS — H9313 Tinnitus, bilateral: Secondary | ICD-10-CM

## 2012-03-22 DIAGNOSIS — H9319 Tinnitus, unspecified ear: Secondary | ICD-10-CM

## 2012-03-22 NOTE — Patient Instructions (Addendum)
Schedule a visit for a physical June 2014

## 2012-03-22 NOTE — Progress Notes (Signed)
  Subjective:    Patient ID: Rick Campbell, male    DOB: 05-22-1945, 67 y.o.   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 Is concerned because for the last few months has noted extremely loud noise thatlast few seconds bilaterally, usually at night. It makes him very uncomfortable and nervous.  Past Medical History  Diagnosis Date  . Esophageal reflux   . Chronic headache   . Hyperlipidemia   . Allergy     seasonal  . Adenomatous polyp of colon    Past Surgical History  Procedure Laterality Date  . Cholecystectomy  1998  . Colonoscopy    . Upper gastrointestinal endoscopy        Review of Systems Denies any dizziness, headaches. Hearing is moderately decreased but at baseline. Denies drinking more than a couple of glasses of wine a week. From time to time takes Motrin and now that he thinks about it, the  days he skips ibuprofen, symptoms are not as severe.      Objective:   Physical Exam General -- alert, well-developed  .   Neck --  no LADs HEENT -- TMs normal, throat w/o redness, face symmetric and not tender to palpation  Extremities-- no pretibial edema bilaterally Neurologic-- alert & oriented X3 and strength normal in all extremities. Psych-- Cognition and judgment appear intact. Alert and cooperative with normal attention span and concentration.  not anxious appearing and not depressed appearing.        Assessment & Plan:   Long h/o tinnitus, decreased hearing at baseline. occ loud noise bilaterally. ENT note reviewed, rec to be reasses by ENT, stop NSAIds as they may be ototoxic. Pt quite concerned about sx, doubt is something serious

## 2012-03-23 DIAGNOSIS — H9319 Tinnitus, unspecified ear: Secondary | ICD-10-CM | POA: Insufficient documentation

## 2012-03-23 NOTE — Assessment & Plan Note (Signed)
Long h/o tinnitus, decreased hearing at baseline presents w/ occ loud noise bilaterally. ENT note from 2011reviewed, dx w/ tinnitus and SNHL, was rec to be reasses by ENT. Also, rec stop NSAIDs as they may be ototoxic. Pt quite concerned about sx but I doubt is something serious

## 2012-03-26 DIAGNOSIS — H9319 Tinnitus, unspecified ear: Secondary | ICD-10-CM | POA: Diagnosis not present

## 2012-03-26 DIAGNOSIS — H905 Unspecified sensorineural hearing loss: Secondary | ICD-10-CM | POA: Diagnosis not present

## 2012-07-15 ENCOUNTER — Encounter: Payer: BC Managed Care – PPO | Admitting: Internal Medicine

## 2012-07-20 ENCOUNTER — Other Ambulatory Visit: Payer: Self-pay | Admitting: Internal Medicine

## 2012-07-20 NOTE — Telephone Encounter (Signed)
Refill done.  

## 2012-08-04 ENCOUNTER — Encounter: Payer: Self-pay | Admitting: Internal Medicine

## 2012-08-04 ENCOUNTER — Ambulatory Visit (INDEPENDENT_AMBULATORY_CARE_PROVIDER_SITE_OTHER): Payer: Medicare Other | Admitting: Internal Medicine

## 2012-08-04 VITALS — BP 128/68 | HR 76 | Temp 98.2°F | Ht 66.5 in | Wt 181.2 lb

## 2012-08-04 DIAGNOSIS — K219 Gastro-esophageal reflux disease without esophagitis: Secondary | ICD-10-CM

## 2012-08-04 DIAGNOSIS — R972 Elevated prostate specific antigen [PSA]: Secondary | ICD-10-CM

## 2012-08-04 DIAGNOSIS — R05 Cough: Secondary | ICD-10-CM | POA: Diagnosis not present

## 2012-08-04 DIAGNOSIS — E785 Hyperlipidemia, unspecified: Secondary | ICD-10-CM

## 2012-08-04 DIAGNOSIS — H9319 Tinnitus, unspecified ear: Secondary | ICD-10-CM

## 2012-08-04 DIAGNOSIS — Z0000 Encounter for general adult medical examination without abnormal findings: Secondary | ICD-10-CM

## 2012-08-04 LAB — LIPID PANEL
Cholesterol: 160 mg/dL (ref 0–200)
VLDL: 14.4 mg/dL (ref 0.0–40.0)

## 2012-08-04 LAB — CBC WITH DIFFERENTIAL/PLATELET
Basophils Relative: 0.3 % (ref 0.0–3.0)
Eosinophils Absolute: 0 10*3/uL (ref 0.0–0.7)
HCT: 46.8 % (ref 39.0–52.0)
Hemoglobin: 15.9 g/dL (ref 13.0–17.0)
Lymphs Abs: 2 10*3/uL (ref 0.7–4.0)
MCHC: 34 g/dL (ref 30.0–36.0)
MCV: 93.5 fl (ref 78.0–100.0)
Monocytes Absolute: 0.6 10*3/uL (ref 0.1–1.0)
Neutro Abs: 4.8 10*3/uL (ref 1.4–7.7)
RBC: 5 Mil/uL (ref 4.22–5.81)

## 2012-08-04 LAB — COMPREHENSIVE METABOLIC PANEL
CO2: 25 mEq/L (ref 19–32)
Calcium: 9.5 mg/dL (ref 8.4–10.5)
GFR: 82.99 mL/min (ref >60.00)
Glucose, Bld: 90 mg/dL (ref 70–99)
Sodium: 142 mEq/L (ref 135–145)
Total Bilirubin: 1 mg/dL (ref 0.3–1.2)
Total Protein: 7.2 g/dL (ref 6.0–8.3)

## 2012-08-04 MED ORDER — RABEPRAZOLE SODIUM 20 MG PO TBEC: 20.0000 mg | DELAYED_RELEASE_TABLET | Freq: Every day | ORAL | Status: DC

## 2012-08-04 NOTE — Assessment & Plan Note (Signed)
DRE 2013 normal, PSA 2013 stable. Pt asx, plan: DRE PSA next year

## 2012-08-04 NOTE — Assessment & Plan Note (Signed)
Chronic issue, well-controlled with GERD treatment, Nasonex and occasional use of Symbicort

## 2012-08-04 NOTE — Progress Notes (Signed)
  Subjective:    Patient ID: Rick Campbell, male    DOB: [birth year], 68 y.o.   MRN: [id number]  HPI  Here for Medicare AWV: 1. Risk factors based on Past M, S, F history: reviewed 2. Physical Activities:  Very active , [employer] x 4-5 times a week   3. Depression/mood:  (-) screening 4. Hearing:  Saw ENT, some hearing loss, tinnitus--->  Stable  5. ADL's:  Independent   6. Fall Risk: no recent falls, see instructions 7. home Safety: does feelsafe at home   8. Height, weight, &visual acuity: see VS, vision well corrected w/ glasses   9. Counseling: provided 10. Labs ordered based on risk factors: if needed   11. Referral Coordination: if needed 12.  Care Plan, see assessment and plan   13.   Cognitive Assessment:motor skills and cognition wnl   In addition we discussed the following: GERD, on Prilosec and Pepcid, would like to go back to AcipHex which is now a generic. Historically, AcipHex works better. Hyperlipidemia, on Lipitor, but compliance, good tolerance. History of chronic cough, well-controlled on Nasonex and Symbicort as needed.  Past Medical History  Diagnosis Date  . Esophageal reflux   . Chronic headache   . Hyperlipidemia   . Allergy     seasonal  . Adenomatous polyp of colon    Past Surgical History  Procedure Laterality Date  . Cholecystectomy  1998  . Colonoscopy    . Upper gastrointestinal endoscopy      Family History:   Fa , deceased at age ~ 67y/o   CAD - F (MI, CABG age 2 - CHF)   HTN - M   Stroke - no   DM - F   colon Ca - F   lung Ca - M +smoker   prostate Ca - no    Social History:   Married , 2 kids   retired from [employer]   Never Smoked   Alcohol use-yes (1-2 glasses wine every week)      Review of Systems Regular exercise-yes (4-5 x wk)   diet-- trying to eat healthy  No chest pain or shortness or breath Denies nausea, vomiting, diarrhea or blood in the stools. No dysuria, gross hematuria and difficulty urinating    Objective:    Physical Exam BP 128/68  Pulse 76  Temp(Src) 98.2 F (36.8 C) (Oral)  Ht 5' 6.5" (1.689 m)  Wt 181 lb 3.2 oz (82.192 kg)  BMI 28.81 kg/m2  SpO2 97% General -- alert, well-developed, NAD.   Neck --no thyromegaly , normal carotid pulse, no bruits Lungs -- normal respiratory effort, no intercostal retractions, no accessory muscle use, and normal breath sounds.   Heart-- normal rate, regular rhythm, no murmur, and no gallop.   Abdomen--soft, non-tender, no distention, no masses, no HSM, no guarding, and no rigidity.   Extremities-- no pretibial edema bilaterally  Neuro--  A&Ox3 and strength normal in all extremities. Psych-- Cognition and judgment appear intact. Alert and cooperative with normal attention span and concentration.  not anxious appearing and not depressed appearing.      Assessment & Plan:

## 2012-08-04 NOTE — Assessment & Plan Note (Addendum)
Td 05 Had the shingles shot Pneumonia shot 2012  Colonoscopy:  , 06/27/2005 , 09-2010---->  1 polyp, next in 5 years  PSAs see elevated PSA recommend to continue with his healthy life style!

## 2012-08-04 NOTE — Patient Instructions (Signed)

## 2012-08-04 NOTE — Assessment & Plan Note (Signed)
Labs

## 2012-08-04 NOTE — Assessment & Plan Note (Signed)
Request change prilosec for generic aciphex: done Will keep pepcid just in cas

## 2012-08-04 NOTE — Assessment & Plan Note (Signed)
Stable

## 2012-08-05 ENCOUNTER — Encounter: Payer: Self-pay | Admitting: Internal Medicine

## 2012-08-09 ENCOUNTER — Encounter: Payer: Self-pay | Admitting: Internal Medicine

## 2012-08-09 ENCOUNTER — Other Ambulatory Visit: Payer: Self-pay | Admitting: Internal Medicine

## 2012-08-09 NOTE — Telephone Encounter (Signed)
Please advise 

## 2012-08-12 ENCOUNTER — Other Ambulatory Visit: Payer: Self-pay | Admitting: Internal Medicine

## 2012-08-12 NOTE — Telephone Encounter (Signed)
Ok to refill? Appears Dr. Sherene Sires has prescribed the last refill. Last OV 7.9.14. Last filled 11.1.12 by Dr. Sherene Sires

## 2012-08-13 NOTE — Telephone Encounter (Signed)
Ok RF x 1 and 6 RF

## 2012-08-13 NOTE — Telephone Encounter (Signed)
Refill done per orders.  

## 2012-08-23 ENCOUNTER — Other Ambulatory Visit: Payer: Self-pay | Admitting: Internal Medicine

## 2012-08-23 NOTE — Telephone Encounter (Signed)
Refill done per protocol.  

## 2012-10-26 ENCOUNTER — Other Ambulatory Visit: Payer: Self-pay | Admitting: Internal Medicine

## 2012-10-26 NOTE — Telephone Encounter (Signed)
rx refilled per protocol. DJR  

## 2012-12-02 ENCOUNTER — Other Ambulatory Visit: Payer: Self-pay

## 2012-12-27 ENCOUNTER — Encounter: Payer: Self-pay | Admitting: Internal Medicine

## 2012-12-27 ENCOUNTER — Ambulatory Visit (INDEPENDENT_AMBULATORY_CARE_PROVIDER_SITE_OTHER): Payer: Medicare Other | Admitting: Internal Medicine

## 2012-12-27 VITALS — BP 121/80 | HR 93 | Temp 98.9°F | Ht 66.5 in | Wt 179.0 lb

## 2012-12-27 DIAGNOSIS — J209 Acute bronchitis, unspecified: Secondary | ICD-10-CM | POA: Diagnosis not present

## 2012-12-27 MED ORDER — AZITHROMYCIN 250 MG PO TABS: ORAL_TABLET | ORAL | Status: DC

## 2012-12-27 MED ORDER — HYDROCODONE-HOMATROPINE 5-1.5 MG/5ML PO SYRP: 5.0000 mL | ORAL_SOLUTION | Freq: Four times a day (QID) | ORAL | Status: DC | PRN

## 2012-12-27 MED ORDER — PREDNISONE 20 MG PO TABS: 20.0000 mg | ORAL_TABLET | Freq: Two times a day (BID) | ORAL | Status: DC

## 2012-12-27 NOTE — Progress Notes (Signed)
Pre visit review using our clinic review tool, if applicable. No additional management support is needed unless otherwise documented below in the visit note. 

## 2012-12-27 NOTE — Patient Instructions (Signed)

## 2012-12-27 NOTE — Progress Notes (Signed)
   Subjective:    Patient ID: Rick Campbell, male    DOB: 03/02/1945, 67 y.o.   MRN: [id number]  HPI Chest "cold " since 12/21/12 ; initial symptoms were nonproductive cough. Within 48 hours he had associated symptoms of postnasal drainage and sore throat. He's also had low grade fevers 99-101 . The cough has become constant and associated with chest tightness & yellow/ brown /green sputum. This has disturbed hir sleep and require that he sit up at night.  Despite the fever he's had no chills or sweatss  He has continued Nasonex and Symbicort without relief. He's also used over-the-counter cough syrup     PMH of pulmonary evaluation by Dr Sherene Sires for cough in 2012. No PMH smoking. Review of Systems  He also denies frontal headache, facial pain, or nasal purulence. For 2 days he's noted pain in the ears without associated discharge. He denies extrinsic symptoms of itchy, watery eyes, sneezing         Objective:   Physical Exam General appearance:good health ;well nourished; no acute distress or increased work of breathing is present.  No  lymphadenopathy about the head, neck, or axilla noted.   Eyes: No conjunctival inflammation or lid edema is present.   Ears:  External ear exam shows no significant lesions or deformities.  Otoscopic examination reveals clear canals, tympanic membranes are intact bilaterally without bulging, retraction, inflammation or discharge.  Nose:  External nasal examination shows no deformity or inflammation. Nasal mucosa are dry & erythematous without lesions or exudates. No septal dislocation or deviation.No obstruction to airflow.   Oral exam: Dental hygiene is good; lips and gums are healthy appearing.There is no oropharyngeal erythema or exudate noted.   Neck:  No deformities,  masses, or tenderness noted.     Heart:  Normal rate and regular rhythm. S1 and S2 normal without gallop, murmur, click, rub or other extra sounds.   Lungs:Chest clear to  auscultation; no wheezes, rhonchi,rales ,or rubs present.No increased work of breathing.  Dry cough   Extremities:  No cyanosis, edema, or clubbing  noted    Skin: Warm & dry .         Assessment & Plan:  #1 acute bronchitis w/o bronchospasm #2 URI w/o sinusitis Plan: See orders and recommendations

## 2013-01-02 ENCOUNTER — Encounter: Payer: Self-pay | Admitting: Internal Medicine

## 2013-01-04 ENCOUNTER — Encounter: Payer: Self-pay | Admitting: Lab

## 2013-01-05 ENCOUNTER — Other Ambulatory Visit (INDEPENDENT_AMBULATORY_CARE_PROVIDER_SITE_OTHER): Payer: Medicare Other

## 2013-01-05 DIAGNOSIS — R972 Elevated prostate specific antigen [PSA]: Secondary | ICD-10-CM | POA: Diagnosis not present

## 2013-01-05 DIAGNOSIS — R780 Finding of alcohol in blood: Secondary | ICD-10-CM

## 2013-01-10 ENCOUNTER — Other Ambulatory Visit: Payer: Self-pay | Admitting: Internal Medicine

## 2013-01-10 NOTE — Telephone Encounter (Signed)
rx refilled per protocol. DJR  

## 2013-01-24 ENCOUNTER — Encounter: Payer: Self-pay | Admitting: Internal Medicine

## 2013-01-24 ENCOUNTER — Ambulatory Visit (INDEPENDENT_AMBULATORY_CARE_PROVIDER_SITE_OTHER): Payer: Medicare Other | Admitting: Internal Medicine

## 2013-01-24 VITALS — BP 115/74 | HR 118 | Temp 99.3°F | Wt 178.6 lb

## 2013-01-24 DIAGNOSIS — J0110 Acute frontal sinusitis, unspecified: Secondary | ICD-10-CM | POA: Diagnosis not present

## 2013-01-24 DIAGNOSIS — R05 Cough: Secondary | ICD-10-CM | POA: Diagnosis not present

## 2013-01-24 MED ORDER — CEFUROXIME AXETIL 500 MG PO TABS: 500.0000 mg | ORAL_TABLET | Freq: Two times a day (BID) | ORAL | Status: DC

## 2013-01-24 MED ORDER — HYDROCODONE-HOMATROPINE 5-1.5 MG/5ML PO SYRP: 5.0000 mL | ORAL_SOLUTION | Freq: Four times a day (QID) | ORAL | Status: DC | PRN

## 2013-01-24 NOTE — Progress Notes (Signed)
Pre visit review using our clinic review tool, if applicable. No additional management support is needed unless otherwise documented below in the visit note. 

## 2013-01-24 NOTE — Progress Notes (Signed)
   Subjective:    Patient ID: Rick Campbell, male    DOB: 12-13-1945, 67 y.o.   MRN: [id number]  HPI   He was originally seen 12/27/12 deep chest cough. He was given azithromycin, narcotic cough syrup, and prednisone which resulted in improvement in the chest symptoms until 12/23  Since that time he's had a hacking nonproductive cough until today. As of today he's been producing some yellow sputum and producing yellow nasal discharge.  He also has frontal headaches at this time and constant postnasal drainage.  He did take the flu shot. He is unsure whether it was the high potency shot.    Review of Systems He is not having arthralgias and myalgias suggestive of flu syndrome  He also denies itchy, watery eyes, sneezing  There's been no fever, chills, or sweats to his knowledge. (See temp 99.3).  He is not having shortness of breath or wheezing with the cough.      Objective:   Physical Exam General appearance:good health ;well nourished; no acute distress or increased work of breathing is present.  No  lymphadenopathy about the head, neck, or axilla noted.   Eyes: No conjunctival inflammation or lid edema is present.   Ears:  External ear exam shows no significant lesions or deformities.  Otoscopic examination reveals clear canals, tympanic membranes are intact bilaterally without bulging, retraction, inflammation or discharge.  Nose:  External nasal examination shows no deformity or inflammation. Nasal mucosa are erythematous without lesions or exudates. No septal dislocation or deviation.No obstruction to airflow.   Oral exam: Dental hygiene is good; lips and gums are healthy appearing.There is no oropharyngeal erythema or exudate noted.   Neck:  No deformities,  masses, or tenderness noted.    Heart:  Slightly increased rate and regular rhythm. S1 and S2 normal without gallop, murmur, click, rub or other extra sounds.   Lungs: Nonproductive cough. Scattered very low grade  expiratory wheezing.No increased work of breathing.    Extremities:  No cyanosis, edema, or clubbing  noted    Skin: Warm & dry .         Assessment & Plan:  #1 frontal rhinosinusitis  #2 cough most likely aggravated by the postnasal drainage. There appears to be some reactive airway component as well  See orders

## 2013-01-24 NOTE — Patient Instructions (Signed)

## 2013-02-25 ENCOUNTER — Encounter: Payer: Self-pay | Admitting: Internal Medicine

## 2013-02-25 ENCOUNTER — Ambulatory Visit (INDEPENDENT_AMBULATORY_CARE_PROVIDER_SITE_OTHER): Payer: Medicare Other | Admitting: Internal Medicine

## 2013-02-25 VITALS — BP 120/73 | HR 71 | Temp 98.5°F | Wt 183.0 lb

## 2013-02-25 DIAGNOSIS — R350 Frequency of micturition: Secondary | ICD-10-CM | POA: Diagnosis not present

## 2013-02-25 DIAGNOSIS — R3989 Other symptoms and signs involving the genitourinary system: Secondary | ICD-10-CM | POA: Diagnosis not present

## 2013-02-25 DIAGNOSIS — R399 Unspecified symptoms and signs involving the genitourinary system: Secondary | ICD-10-CM

## 2013-02-25 DIAGNOSIS — R972 Elevated prostate specific antigen [PSA]: Secondary | ICD-10-CM

## 2013-02-25 LAB — POCT URINALYSIS DIPSTICK
BILIRUBIN UA: NEGATIVE
Blood, UA: NEGATIVE
GLUCOSE UA: NEGATIVE
Ketones, UA: NEGATIVE
Nitrite, UA: NEGATIVE
Protein, UA: NEGATIVE
UROBILINOGEN UA: NEGATIVE
pH, UA: 6.5

## 2013-02-25 MED ORDER — TAMSULOSIN HCL 0.4 MG PO CAPS: 0.4000 mg | ORAL_CAPSULE | Freq: Every day | ORAL | Status: DC

## 2013-02-25 NOTE — Progress Notes (Signed)
   Subjective:    Patient ID: Rick Campbell, male    DOB: Jul 28, 1945, 68 y.o.   MRN: [id number]  HPI Acute visit For the last 2 or 3 nights, he has been experiencing nocturia, usually has to get up 2 or 3 times a night, before that was a rare occurrence. He was treated with hydrocodone for cough in December but the symptoms preceded the prescription of cough medication. Chart is reviewed, he has a history of increased PSAs.  Past Medical History  Diagnosis Date  . Esophageal reflux   . Chronic headache   . Hyperlipidemia   . Allergy     seasonal  . Adenomatous polyp of colon    Past Surgical History  Procedure Laterality Date  . Cholecystectomy  1998  . Colonoscopy    . Upper gastrointestinal endoscopy     History   Social History  . Marital Status: Married    Spouse Name: N/A    Number of Children: 2  . Years of Education: N/A   Occupational History  . retired     Pharmacologist   Social History Main Topics  . Smoking status: Never Smoker   . Smokeless tobacco: Never Used  . Alcohol Use: Yes     Comment: 1 or 2 glasses of wine every week  . Drug Use: No  . Sexual Activity: Not on file   Other Topics Concern  . Not on file   Social History Narrative   retired from [employer] History:   Fa , deceased at age ~ 68y/o   CAD - F (MI, CABG age 18 - CHF)   HTN - M   Stroke - no   DM - F   colon Ca - F   lung Ca - M +smoker   prostate Ca - no     Review of Systems Denies dysuria, gross hematuria or difficulty urinating. No scrotal pain Denies abdominal pain, nausea, vomiting or blood in the stools    Objective:   Physical Exam BP 120/73  Pulse 71  Temp(Src) 98.5 F (36.9 C)  Wt 183 lb (83.008 kg)  SpO2 98% General -- alert, well-developed, NAD.  HEENT-- Not pale. Lungs -- normal respiratory effort, no intercostal retractions, no accessory muscle use, and normal breath sounds.  Heart-- normal rate, regular rhythm, no murmur.    Abdomen-- Not distended, good bowel sounds,soft, non-tender. Rectal-- No external abnormalities noted. Normal sphincter tone. No rectal masses or tenderness. Brown stool   Prostate--Prostate gland firm and smooth, no enlargement, nodularity, tenderness, mass, asymmetry or induration. Extremities-- no pretibial edema bilaterally   Psych-- Cognition and judgment appear intact. Cooperative with normal attention span and concentration. No anxious or depressed appearing.       Assessment & Plan:

## 2013-02-25 NOTE — Progress Notes (Signed)
Pre visit review using our clinic review tool, if applicable. No additional management support is needed unless otherwise documented below in the visit note. 

## 2013-02-25 NOTE — Patient Instructions (Signed)
Try Flomax   at night Call if the symptoms are not improving or if they get worse Next visit in approximately 6 months for a physical exam, fasting

## 2013-02-26 LAB — URINALYSIS, ROUTINE W REFLEX MICROSCOPIC
Bilirubin Urine: NEGATIVE
Glucose, UA: NEGATIVE mg/dL
HGB URINE DIPSTICK: NEGATIVE
KETONES UR: NEGATIVE mg/dL
Leukocytes, UA: NEGATIVE
NITRITE: NEGATIVE
Protein, ur: NEGATIVE mg/dL
SPECIFIC GRAVITY, URINE: 1.006 (ref 1.005–1.030)
UROBILINOGEN UA: 0.2 mg/dL (ref 0.0–1.0)
pH: 6.5 (ref 5.0–8.0)

## 2013-02-27 ENCOUNTER — Encounter: Payer: Self-pay | Admitting: Internal Medicine

## 2013-02-27 LAB — URINE CULTURE
COLONY COUNT: NO GROWTH
ORGANISM ID, BACTERIA: NO GROWTH

## 2013-02-27 NOTE — Assessment & Plan Note (Addendum)
New problem, LUTS Nocturia for 3 months, rectal exam is benign, most recent PSA was actually lower than the previous one. Urinalysis showed trace leukocytes Plan: UA, urine culture, trial with Flomax, treat w/ abx if appropriate

## 2013-03-21 ENCOUNTER — Telehealth: Payer: Self-pay | Admitting: Internal Medicine

## 2013-03-21 NOTE — Telephone Encounter (Signed)
Please call the patient, was seen 3 weeks ago with prostate symptoms, is he doing better? Is he taking the medication I prescribed? Let me know how he is doing

## 2013-03-21 NOTE — Telephone Encounter (Signed)
Pt states overall doing okay , pt is taking medication prescribed, pt wants to know if he should cut back one the medication , noticed changes in his sexual wellness -less desire - dry ejaculation but is sleeping better. Please advise.

## 2013-03-21 NOTE — Telephone Encounter (Signed)
Is able to break the tablet in half, okay to take half tablet daily

## 2013-03-22 NOTE — Addendum Note (Signed)
Addended by: Ewing Schlein on: 03/22/2013 11:06 AM   Modules accepted: Orders

## 2013-03-22 NOTE — Telephone Encounter (Signed)
Spoke with patient and he has been made aware to take 1/2 of a tablet and he has agreed to do so. No further questions or concerns at this time.     KP

## 2013-03-25 ENCOUNTER — Encounter: Payer: Self-pay | Admitting: Internal Medicine

## 2013-03-28 ENCOUNTER — Other Ambulatory Visit: Payer: Self-pay | Admitting: *Deleted

## 2013-03-28 NOTE — Telephone Encounter (Signed)
He could try one tablet every other day

## 2013-03-28 NOTE — Telephone Encounter (Signed)
Pt notified, agrees with plan.  

## 2013-03-28 NOTE — Telephone Encounter (Signed)
Pt unable to cut flomax capsule in half.  Did not see any tablet form available for flomax. Please advise

## 2013-05-11 ENCOUNTER — Encounter: Payer: Self-pay | Admitting: Internal Medicine

## 2013-05-11 ENCOUNTER — Ambulatory Visit (INDEPENDENT_AMBULATORY_CARE_PROVIDER_SITE_OTHER): Payer: Medicare Other | Admitting: Internal Medicine

## 2013-05-11 ENCOUNTER — Encounter: Payer: Self-pay | Admitting: *Deleted

## 2013-05-11 VITALS — BP 136/71 | HR 96 | Temp 98.1°F | Wt 185.0 lb

## 2013-05-11 DIAGNOSIS — J309 Allergic rhinitis, unspecified: Secondary | ICD-10-CM | POA: Diagnosis not present

## 2013-05-11 MED ORDER — PREDNISONE 10 MG PO TABS: 20.0000 mg | ORAL_TABLET | Freq: Every day | ORAL | Status: DC

## 2013-05-11 MED ORDER — AMOXICILLIN 500 MG PO CAPS: 1000.0000 mg | ORAL_CAPSULE | Freq: Two times a day (BID) | ORAL | Status: DC

## 2013-05-11 MED ORDER — AZELASTINE HCL 0.1 % NA SOLN: 2.0000 | Freq: Every day | NASAL | Status: DC

## 2013-05-11 NOTE — Patient Instructions (Signed)
Continue nasonex  Add Astelin at bedtime Claritin OTC 10 mg one tablet daily as needed for allergies Take prednisone 10 mg 2 tablets daily for 5 days If you are not better, have fever, chills or green nasal discharge --->  start amoxicillin.

## 2013-05-11 NOTE — Progress Notes (Signed)
Pre visit review using our clinic review tool, if applicable. No additional management support is needed unless otherwise documented below in the visit note. 

## 2013-05-11 NOTE — Assessment & Plan Note (Signed)
See history of present illness, symptoms likely due to allergic rhinitis. Treat  with Astelin, Claritin and a short dose of prednisone. If no better he has a prescription for amoxicillin in case he needs it.

## 2013-05-11 NOTE — Progress Notes (Signed)
Subjective:    Patient ID: Rick Campbell, male    DOB: February 04, 1945, 68 y.o.   MRN: [id number]  DOS:  05/11/2013 Type of  visit: Acute visit 2 weeks history of increased respiratory symptoms--- sneezing, nasal congestion, some headaches. Also postnasal dripping, clear or yellow in color. Postnasal dripping causing cough. Currently taking Nasonex Patient concerned because he is flying to [place] soon  ROS Denies fever chills. No chest congestion per se or sputum production. + Itchy eyes and nose. Mild sore throat.   Past Medical History  Diagnosis Date  . Esophageal reflux   . Chronic headache   . Hyperlipidemia   . Allergy     seasonal  . Adenomatous polyp of colon     Past Surgical History  Procedure Laterality Date  . Cholecystectomy  1998  . Colonoscopy    . Upper gastrointestinal endoscopy      History   Social History  . Marital Status: Married    Spouse Name: N/A    Number of Children: 2  . Years of Education: N/A   Occupational History  . retired     Pharmacologist   Social History Main Topics  . Smoking status: Never Smoker   . Smokeless tobacco: Never Used  . Alcohol Use: Yes     Comment: 1 or 2 glasses of wine every week  . Drug Use: No  . Sexual Activity: Not on file   Other Topics Concern  . Not on file   Social History Narrative   retired from [employer]                Medication List       This list is accurate as of: 05/11/13  6:17 PM.  Always use your most recent med list.               amoxicillin 500 MG capsule  Commonly known as:  AMOXIL  Take 2 capsules (1,000 mg total) by mouth 2 (two) times daily.     aspirin 81 MG tablet  Take 81 mg by mouth daily.     atorvastatin 10 MG tablet  Commonly known as:  LIPITOR  TAKE 1 TABLET BY MOUTH DAILY     azelastine 137 MCG/SPRAY nasal spray  Commonly known as:  ASTELIN  Place 2 sprays into both nostrils at bedtime.     famotidine 20 MG tablet  Commonly known as:  PEPCID  Take  20 mg by mouth daily.     NASONEX 50 MCG/ACT nasal spray  Generic drug:  mometasone  INSTILL 2 SPRAYS IN THE NOSE DAILY AS DIRECTED     predniSONE 10 MG tablet  Commonly known as:  DELTASONE  Take 2 tablets (20 mg total) by mouth daily.     RABEprazole 20 MG tablet  Commonly known as:  ACIPHEX  Take 1 tablet (20 mg total) by mouth daily.     SENIOR MULTIVITAMIN PLUS PO  Take 1 tablet by mouth daily.     SYMBICORT 80-4.5 MCG/ACT inhaler  Generic drug:  budesonide-formoterol  INHALE 2 PUFFS INTO THE LUNGS TWICE DAILY as needed     tamsulosin 0.4 MG Caps capsule  Commonly known as:  FLOMAX  Take 0.4 mg by mouth daily. Take 1/2 of a tablet daily           Objective:   Physical Exam BP 136/71  Pulse 96  Temp(Src) 98.1 F (36.7 C)  Wt 185 lb (83.915 kg)  SpO2 99%  General -- alert, well-developed, NAD.   HEENT-- Not pale. TMs normal, throat symmetric, no redness or discharge. Face symmetric, sinuses mildly tender to palpation throughout. Nose slt congested.  Lungs -- normal respiratory effort, no intercostal retractions, no accessory muscle use, and normal breath sounds.  Heart-- normal rate, regular rhythm, no murmur.   Psych-- Cognition and judgment appear intact. Cooperative with normal attention span and concentration. No anxious or depressed appearing.       Assessment & Plan:

## 2013-06-30 ENCOUNTER — Telehealth: Payer: Self-pay

## 2013-06-30 NOTE — Telephone Encounter (Signed)
Spoke with patient and reschedule due to CPE to early.

## 2013-07-01 ENCOUNTER — Encounter: Payer: Medicare Other | Admitting: Internal Medicine

## 2013-07-14 ENCOUNTER — Other Ambulatory Visit: Payer: Self-pay | Admitting: Internal Medicine

## 2013-07-21 ENCOUNTER — Other Ambulatory Visit: Payer: Self-pay | Admitting: *Deleted

## 2013-07-21 MED ORDER — RABEPRAZOLE SODIUM 20 MG PO TBEC: 20.0000 mg | DELAYED_RELEASE_TABLET | Freq: Every day | ORAL | Status: DC

## 2013-08-10 ENCOUNTER — Encounter: Payer: Self-pay | Admitting: Internal Medicine

## 2013-08-10 ENCOUNTER — Ambulatory Visit (INDEPENDENT_AMBULATORY_CARE_PROVIDER_SITE_OTHER): Payer: Medicare Other | Admitting: Internal Medicine

## 2013-08-10 VITALS — BP 102/67 | HR 75 | Temp 98.2°F | Ht 66.0 in | Wt 186.0 lb

## 2013-08-10 DIAGNOSIS — Z23 Encounter for immunization: Secondary | ICD-10-CM | POA: Diagnosis not present

## 2013-08-10 DIAGNOSIS — Z0000 Encounter for general adult medical examination without abnormal findings: Secondary | ICD-10-CM | POA: Diagnosis not present

## 2013-08-10 DIAGNOSIS — R972 Elevated prostate specific antigen [PSA]: Secondary | ICD-10-CM | POA: Diagnosis not present

## 2013-08-10 DIAGNOSIS — R05 Cough: Secondary | ICD-10-CM

## 2013-08-10 DIAGNOSIS — K219 Gastro-esophageal reflux disease without esophagitis: Secondary | ICD-10-CM

## 2013-08-10 DIAGNOSIS — E785 Hyperlipidemia, unspecified: Secondary | ICD-10-CM

## 2013-08-10 DIAGNOSIS — R059 Cough, unspecified: Secondary | ICD-10-CM | POA: Diagnosis not present

## 2013-08-10 LAB — TSH: TSH: 0.6 u[IU]/mL (ref 0.35–4.50)

## 2013-08-10 LAB — BASIC METABOLIC PANEL
BUN: 14 mg/dL (ref 6–23)
CALCIUM: 9.3 mg/dL (ref 8.4–10.5)
CHLORIDE: 105 meq/L (ref 96–112)
CO2: 32 mEq/L (ref 19–32)
CREATININE: 0.8 mg/dL (ref 0.4–1.5)
GFR: 102.11 mL/min (ref >60.00)
GLUCOSE: 103 mg/dL — AB (ref 70–99)
Potassium: 3.7 mEq/L (ref 3.5–5.1)
SODIUM: 139 meq/L (ref 135–145)

## 2013-08-10 LAB — LIPID PANEL
CHOLESTEROL: 145 mg/dL (ref 0–200)
HDL: 59.3 mg/dL (ref >39.00)
LDL CALC: 77 mg/dL (ref 0–99)
NonHDL: 85.7
Total CHOL/HDL Ratio: 2
Triglycerides: 44 mg/dL (ref 0.0–149.0)
VLDL: 8.8 mg/dL (ref 0.0–40.0)

## 2013-08-10 LAB — ALT: ALT: 42 U/L (ref 0–53)

## 2013-08-10 LAB — AST: AST: 26 U/L (ref 0–37)

## 2013-08-10 NOTE — Assessment & Plan Note (Signed)
Well-controlled with daily PPIs and occasional Pepcid

## 2013-08-10 NOTE — Assessment & Plan Note (Signed)
Last PSA 12/2012: Stable Last DRE 01-2013 negative Symptoms definitely decreased with Flomax every other day. Plan: Reassess in 1 year

## 2013-08-10 NOTE — Assessment & Plan Note (Signed)
Due for labs, good compliance with Lipitor

## 2013-08-10 NOTE — Assessment & Plan Note (Addendum)
Td today Hadzostavax Pneumonia shot 2012 prevnar-- today  Colonoscopy:  , 06/27/2005 , 09-2010---->  1 polyp, next in 5 years  recommend to continue with his healthy life style!

## 2013-08-10 NOTE — Patient Instructions (Signed)
Get your blood work before you leave   Next visit is for a physical exam in 1 year, sooner if needed ,  fasting Please make an appointment     Fall Prevention and [place] cause injuries and can affect all age groups. It is possible to use preventive measures to significantly decrease the likelihood of falls. There are many simple measures which can make your home safer and prevent falls. OUTDOORS  Repair cracks and edges of walkways and driveways.  Remove high doorway thresholds.  Trim shrubbery on the main path into your home.  Have good outside lighting.  Clear walkways of tools, rocks, debris, and clutter.  Check that handrails are not broken and are securely fastened. Both sides of steps should have handrails.  Have leaves, snow, and ice cleared regularly.  Use sand or salt on walkways during winter months.  In the garage, clean up grease or oil spills. BATHROOM  Install night lights.  Install grab bars by the toilet and in the tub and shower.  Use non-skid mats or decals in the tub or shower.  Place a plastic non-slip stool in the shower to sit on, if needed.  Keep floors dry and clean up all water on the floor immediately.  Remove soap buildup in the tub or shower on a regular basis.  Secure bath mats with non-slip, double-sided rug tape.  Remove throw rugs and tripping hazards from the floors. BEDROOMS  Install night lights.  Make sure a bedside light is easy to reach.  Do not use oversized bedding.  Keep a telephone by your bedside.  Have a firm chair with side arms to use for getting dressed.  Remove throw rugs and tripping hazards from the floor. KITCHEN  Keep handles on pots and pans turned toward the center of the stove. Use back burners when possible.  Clean up spills quickly and allow time for drying.  Avoid walking on wet floors.  Avoid hot utensils and knives.  Position shelves so they are not too high or low.  Place  commonly used objects within easy reach.  If necessary, use a sturdy step stool with a grab bar when reaching.  Keep electrical cables out of the way.  Do not use floor polish or wax that makes floors slippery. If you must use wax, use non-skid floor wax.  Remove throw rugs and tripping hazards from the floor. STAIRWAYS  Never leave objects on stairs.  Place handrails on both sides of stairways and use them. Fix any loose handrails. Make sure handrails on both sides of the stairways are as long as the stairs.  Check carpeting to make sure it is firmly attached along stairs. Make repairs to worn or loose carpet promptly.  Avoid placing throw rugs at the top or bottom of stairways, or properly secure the rug with carpet tape to prevent slippage. Get rid of throw rugs, if possible.  Have an electrician put in a light switch at the top and bottom of the stairs. OTHER FALL PREVENTION TIPS  Wear low-heel or rubber-soled shoes that are supportive and fit well. Wear closed toe shoes.  When using a stepladder, make sure it is fully opened and both spreaders are firmly locked. Do not climb a closed stepladder.  Add color or contrast paint or tape to grab bars and handrails in your home. Place contrasting color strips on first and last steps.  Learn and use mobility aids as needed. Install an electrical emergency response system.  Turn on lights to avoid dark areas. Replace light bulbs that burn out immediately. Get light switches that glow.  Arrange furniture to create clear pathways. Keep furniture in the same place.  Firmly attach carpet with non-skid or double-sided tape.  Eliminate uneven floor surfaces.  Select a carpet pattern that does not visually hide the edge of steps.  Be aware of all pets. OTHER HOME SAFETY TIPS  Set the water temperature for 120 F (48.8 C).  Keep emergency numbers on or near the telephone.  Keep smoke detectors on every level of the home and near  sleeping areas. Document Released: 01/03/2002 Document Revised: 07/15/2011 Document Reviewed: 04/04/2011 [facility] Patient Information 2015 [place], [place]. This information is not intended to replace advice given to you by your health care provider. Make sure you discuss any questions you have with your health care provider.

## 2013-08-10 NOTE — Assessment & Plan Note (Signed)
Symptoms currently well controlled, use Symbicort as needed

## 2013-08-10 NOTE — Progress Notes (Signed)
Subjective:    Patient ID: Rick Campbell, male    DOB: Jul 25, 1945, 68 y.o.   MRN: [id number]  DOS:  08/10/2013 Type of visit - description:   Here for Medicare AWV: 1. Risk factors based on Past M, S, F history: reviewed 2. Physical Activities:  Very active , [employer] x 4-5 times a week , walks-golf  3. Depression/mood:  (-) screening 4. Hearing:  Saw ENT, some hearing loss, tinnitus--->  Stable   5. ADL's:  Independent   6. Fall Risk: no recent falls, see instructions 7. home Safety: does feelsafe at home   8. Height, weight, &visual acuity: see VS, vision well corrected w/ glasses , sees the eye doctor   9. Counseling: provided 10. Labs ordered based on risk factors: if needed   11. Referral Coordination: if needed 12.  Care Plan, see assessment and plan   13.   Cognitive Assessment: motor skills and cognition wnl   In addition we discussed the following: LUTS-- Symptoms definitely improved with Flomax High cholesterol -- good compliance with Lipitor Allergies, bronchospasm: symptoms well-controlled with Symbicort as needed GERD, good compliance with PPIs and takes Pepcid as needed, asymptomatic.    ROS Denies chest pain or difficulty breathing, no lower extremity edema No nausea, vomiting, diarrhea blood in the stools. No gross hematuria or difficulty urinating  Past Medical History  Diagnosis Date  . Esophageal reflux   . Chronic headache   . Hyperlipidemia   . Allergy     seasonal  . Adenomatous polyp of colon   . Lower urinary tract symptoms (LUTS)     Past Surgical History  Procedure Laterality Date  . Cholecystectomy  1998  . Colonoscopy    . Upper gastrointestinal endoscopy      History   Social History  . Marital Status: Married    Spouse Name: N/A    Number of Children: 2  . Years of Education: N/A   Occupational History  . retired     Pharmacologist   Social History Main Topics  . Smoking status: Never Smoker   . Smokeless tobacco: Never Used  .  Alcohol Use: Yes     Comment: 1 or 2 glasses of wine every week  . Drug Use: No  . Sexual Activity: Not on file   Other Topics Concern  . Not on file   Social History Narrative   retired from [employer]  , lives w/ wife , both children in [place]              Family History  Problem Relation Age of Onset  . Colon cancer Father     dx age 58s  . Lung cancer Mother     was a heavy smoker  . COPD Neg Hx   . Arthritis Neg Hx   . Prostate cancer Neg Hx   . Diabetes Father   . Heart Problems Father     fathjer CHF in his 79s       Medication List       This list is accurate as of: 08/10/13  2:26 PM.  Always use your most recent med list.               aspirin 81 MG tablet  Take 81 mg by mouth daily.     atorvastatin 10 MG tablet  Commonly known as:  LIPITOR  TAKE 1 TABLET BY MOUTH DAILY     azelastine 0.1 % nasal spray  Commonly known  as:  ASTELIN  Place 2 sprays into both nostrils at bedtime.     famotidine 20 MG tablet  Commonly known as:  PEPCID  Take 20 mg by mouth daily.     NASONEX 50 MCG/ACT nasal spray  Generic drug:  mometasone  INSTILL 2 SPRAYS IN THE NOSE DAILY AS DIRECTED     RABEprazole 20 MG tablet  Commonly known as:  ACIPHEX  Take 1 tablet (20 mg total) by mouth daily.     SENIOR MULTIVITAMIN PLUS PO  Take 1 tablet by mouth daily.     SYMBICORT 80-4.5 MCG/ACT inhaler  Generic drug:  budesonide-formoterol  INHALE 2 PUFFS INTO THE LUNGS TWICE DAILY as needed     tamsulosin 0.4 MG Caps capsule  Commonly known as:  FLOMAX  Take 0.4 mg by mouth daily. Take 1/2 of a tablet daily           Objective:   Physical Exam BP 102/67  Pulse 75  Temp(Src) 98.2 F (36.8 C)  Ht 5\' 6"  (1.676 m)  Wt 186 lb (84.369 kg)  BMI 30.04 kg/m2  SpO2 98%  General -- alert, well-developed, NAD.  Neck --no thyromegaly  HEENT-- Not pale.  Lungs -- normal respiratory effort, no intercostal retractions, no accessory muscle use, and normal breath sounds.    Heart-- normal rate, regular rhythm, no murmur.  Abdomen-- Not distended, good bowel sounds,soft, non-tender.  Extremities-- no pretibial edema bilaterally  Neurologic--  alert & oriented X3. Speech normal, gait appropriate for age, strength symmetric and appropriate for age.  Psych-- Cognition and judgment appear intact. Cooperative with normal attention span and concentration. No anxious or depressed appearing.       Assessment & Plan:   Also had a scratch at the L suprapubic area  Few weeks ago, since then has a "spot " there, no bleeding. Exam--  0.7 cm area of hyperpigmentation rec observation, plans to see derm soon just for a check up, rec to discuss w/ them

## 2013-08-10 NOTE — Progress Notes (Signed)
Pre visit review using our clinic review tool, if applicable. No additional management support is needed unless otherwise documented below in the visit note. 

## 2013-08-23 ENCOUNTER — Other Ambulatory Visit: Payer: Self-pay | Admitting: Internal Medicine

## 2013-08-24 ENCOUNTER — Telehealth: Payer: Self-pay | Admitting: Internal Medicine

## 2013-08-24 MED ORDER — TAMSULOSIN HCL 0.4 MG PO CAPS: ORAL_CAPSULE | ORAL | Status: DC

## 2013-08-24 NOTE — Telephone Encounter (Signed)
Caller name: [facility] Relation to VV:[id number] Call back number: Pharmacy: [facility] [id number] - [place], [place]  Reason for call: pt would like a 90 re-fill on tamsulosin (FLOMAX) 0.4 MG CAPS capsule

## 2013-08-24 NOTE — Telephone Encounter (Signed)
rx sent

## 2013-10-10 ENCOUNTER — Other Ambulatory Visit: Payer: Self-pay | Admitting: Internal Medicine

## 2013-10-17 DIAGNOSIS — L908 Other atrophic disorders of skin: Secondary | ICD-10-CM | POA: Diagnosis not present

## 2013-10-17 DIAGNOSIS — L570 Actinic keratosis: Secondary | ICD-10-CM | POA: Diagnosis not present

## 2013-10-17 DIAGNOSIS — L821 Other seborrheic keratosis: Secondary | ICD-10-CM | POA: Diagnosis not present

## 2013-10-17 DIAGNOSIS — L719 Rosacea, unspecified: Secondary | ICD-10-CM | POA: Diagnosis not present

## 2013-10-17 DIAGNOSIS — L918 Other hypertrophic disorders of the skin: Secondary | ICD-10-CM | POA: Diagnosis not present

## 2013-10-17 DIAGNOSIS — D235 Other benign neoplasm of skin of trunk: Secondary | ICD-10-CM | POA: Diagnosis not present

## 2013-10-17 DIAGNOSIS — D234 Other benign neoplasm of skin of scalp and neck: Secondary | ICD-10-CM | POA: Diagnosis not present

## 2013-10-17 DIAGNOSIS — D485 Neoplasm of uncertain behavior of skin: Secondary | ICD-10-CM | POA: Diagnosis not present

## 2013-11-22 ENCOUNTER — Ambulatory Visit (INDEPENDENT_AMBULATORY_CARE_PROVIDER_SITE_OTHER): Payer: Medicare Other | Admitting: *Deleted

## 2013-11-22 DIAGNOSIS — Z23 Encounter for immunization: Secondary | ICD-10-CM

## 2013-12-27 ENCOUNTER — Other Ambulatory Visit: Payer: Self-pay | Admitting: Internal Medicine

## 2014-04-19 DIAGNOSIS — H524 Presbyopia: Secondary | ICD-10-CM | POA: Diagnosis not present

## 2014-04-19 DIAGNOSIS — H5203 Hypermetropia, bilateral: Secondary | ICD-10-CM | POA: Diagnosis not present

## 2014-04-19 DIAGNOSIS — H25019 Cortical age-related cataract, unspecified eye: Secondary | ICD-10-CM | POA: Diagnosis not present

## 2014-04-24 ENCOUNTER — Other Ambulatory Visit: Payer: Self-pay | Admitting: Internal Medicine

## 2014-05-15 ENCOUNTER — Encounter: Payer: Self-pay | Admitting: Internal Medicine

## 2014-05-16 ENCOUNTER — Other Ambulatory Visit: Payer: Self-pay

## 2014-05-16 ENCOUNTER — Telehealth: Payer: Self-pay

## 2014-05-16 NOTE — Telephone Encounter (Signed)
-----   Message from Irven Baltimore sent at 05/16/2014 11:10 AM EDT ----- Regarding: RE: Appointment Called patient and sent mychart message ----- Message -----    From: Wilfrid Lund, CMA    Sent: 05/16/2014   8:55 AM      To: Irven Baltimore Subject: FW: Appointment                                Hey, can you call Pt and have him schedule an appt within 10 days per Dr. Larose Kells.  Thank you!! :) ----- Message -----    From: Colon Branch, MD    Sent: 05/15/2014   5:33 PM      To: Wilfrid Lund, CMA Subject: Appointment                                    Please call patient, needs an appointment to see me within 10 days

## 2014-05-16 NOTE — Telephone Encounter (Signed)
Pt has acute appt scheduled for 05/17/2014 at 1030.

## 2014-05-17 ENCOUNTER — Encounter: Payer: Self-pay | Admitting: Internal Medicine

## 2014-05-17 ENCOUNTER — Ambulatory Visit (INDEPENDENT_AMBULATORY_CARE_PROVIDER_SITE_OTHER): Payer: Medicare Other | Admitting: Internal Medicine

## 2014-05-17 VITALS — BP 122/78 | HR 70 | Temp 98.0°F | Ht 66.0 in | Wt 180.2 lb

## 2014-05-17 DIAGNOSIS — R972 Elevated prostate specific antigen [PSA]: Secondary | ICD-10-CM

## 2014-05-17 DIAGNOSIS — R399 Unspecified symptoms and signs involving the genitourinary system: Secondary | ICD-10-CM

## 2014-05-17 DIAGNOSIS — R634 Abnormal weight loss: Secondary | ICD-10-CM | POA: Diagnosis not present

## 2014-05-17 DIAGNOSIS — R351 Nocturia: Secondary | ICD-10-CM

## 2014-05-17 LAB — POCT URINALYSIS DIPSTICK
BILIRUBIN UA: NEGATIVE
Blood, UA: NEGATIVE
Glucose, UA: NEGATIVE
LEUKOCYTES UA: NEGATIVE
NITRITE UA: NEGATIVE
Spec Grav, UA: 1.025
Urobilinogen, UA: 0.2
pH, UA: 6

## 2014-05-17 LAB — BASIC METABOLIC PANEL
BUN: 16 mg/dL (ref 6–23)
CALCIUM: 9.7 mg/dL (ref 8.4–10.5)
CO2: 32 mEq/L (ref 19–32)
Chloride: 103 mEq/L (ref 96–112)
Creatinine, Ser: 0.95 mg/dL (ref 0.40–1.50)
GFR: 83.55 mL/min (ref >60.00)
GLUCOSE: 86 mg/dL (ref 70–99)
POTASSIUM: 4.2 meq/L (ref 3.5–5.1)
Sodium: 139 mEq/L (ref 135–145)

## 2014-05-17 LAB — URINALYSIS, ROUTINE W REFLEX MICROSCOPIC
BILIRUBIN URINE: NEGATIVE
Hgb urine dipstick: NEGATIVE
Ketones, ur: 15 — AB
LEUKOCYTES UA: NEGATIVE
Nitrite: NEGATIVE
PH: 6 (ref 5.0–8.0)
SPECIFIC GRAVITY, URINE: 1.015 (ref 1.000–1.030)
Total Protein, Urine: NEGATIVE
Urine Glucose: NEGATIVE
Urobilinogen, UA: 0.2 (ref 0.0–1.0)

## 2014-05-17 LAB — PSA: PSA: 3.72 ng/mL (ref 0.10–4.00)

## 2014-05-17 LAB — HEMOGLOBIN A1C: Hgb A1c MFr Bld: 5.3 % (ref 4.6–6.5)

## 2014-05-17 NOTE — Progress Notes (Signed)
Pre visit review using our clinic review tool, if applicable. No additional management support is needed unless otherwise documented below in the visit note. 

## 2014-05-17 NOTE — Patient Instructions (Signed)
Get your blood work before you leave     Come back to the office BY 07-2014  for a physical exam  Please schedule an appointment at the front desk    Come back fasting

## 2014-05-17 NOTE — Progress Notes (Signed)
Subjective:    Patient ID: Rick Campbell, male    DOB: 1945/04/03, 69 y.o.   MRN: [id number]  DOS:  05/17/2014 Type of visit - description : acute visit Interval history:  Mr. Rick Campbell is a 69 year old healthy male with a history of LUTS coming in for an acute bout of 2 weeks of nocturia with him waking up to void around two times per night. He was started on Flomax for three months of nocturia in 01/2013 which was well-controlled up until a few weeks ago. He notes that his daytime urination remains unchanged. He denies fever, chills, dysuria, hematuria, difficulty voiding or increased urgency. He also denies abdominal pain. Patient notes that he would like a PSA to recheck prostate.  Review of Systems   Constitutional: No fever, chills, intentional 10 lb weight loss. Respiratory: No difficulty breathing. Cardiovascular: No CP, leg swelling or palpitations GI: no nausea, vomiting, diarrhea or abdominal pain.  No blood in the stools. No dysphagia or odynophagia Endocrine: As per HPI GU: No dysuria, gross hematuria, difficulty urinating. Increased frequency as per HPI. Neurological: No dizziness, headaches, or diplopia.   Past Medical History  Diagnosis Date  . Esophageal reflux   . Chronic headache   . Hyperlipidemia   . Allergy     seasonal  . Adenomatous polyp of colon   . Lower urinary tract symptoms (LUTS)     Past Surgical History  Procedure Laterality Date  . Cholecystectomy  1998  . Colonoscopy    . Upper gastrointestinal endoscopy      History   Social History  . Marital Status: Married    Spouse Name: N/A  . Number of Children: 2  . Years of Education: N/A   Occupational History  . retired     Pharmacologist   Social History Main Topics  . Smoking status: Never Smoker   . Smokeless tobacco: Never Used  . Alcohol Use: Yes     Comment: 1 or 2 glasses of wine every week  . Drug Use: No  . Sexual Activity: Not on file   Other Topics Concern  . Not on file    Social History Narrative   retired from [employer]  , lives w/ wife , both children in [place]                 Medication List       This list is accurate as of: 05/17/14 10:29 AM.  Always use your most recent med list.               aspirin 81 MG tablet  Take 81 mg by mouth daily.     atorvastatin 10 MG tablet  Commonly known as:  LIPITOR  Take 1 tablet (10 mg total) by mouth daily.     azelastine 0.1 % nasal spray  Commonly known as:  ASTELIN  Place 2 sprays into both nostrils at bedtime.     budesonide-formoterol 80-4.5 MCG/ACT inhaler  Commonly known as:  SYMBICORT  Inhale 2 puffs into the lungs 2 (two) times daily.     famotidine 20 MG tablet  Commonly known as:  PEPCID  Take 20 mg by mouth daily.     NASONEX 50 MCG/ACT nasal spray  Generic drug:  mometasone  INSTILL 2 SPRAYS IN THE NOSE DAILY AS DIRECTED     RABEprazole 20 MG tablet  Commonly known as:  ACIPHEX  Take 1 tablet (20 mg total) by mouth daily.  SENIOR MULTIVITAMIN PLUS PO  Take 1 tablet by mouth daily.     tamsulosin 0.4 MG Caps capsule  Commonly known as:  FLOMAX  Take 1 capsule (0.4 mg total) by mouth daily.           Objective:   Physical Exam BP 122/78 mmHg  Pulse 70  Temp(Src) 98 F (36.7 C) (Oral)  Ht 5\' 6"  (1.676 m)  Wt 180 lb 4 oz (81.761 kg)  BMI 29.11 kg/m2  SpO2 97%  General:   Well developed, well nourished . NAD.  HEENT:  Normocephalic . Face symmetric, atraumatic Lungs:  CTA B Normal respiratory effort, no intercostal retractions, no accessory muscle use. Heart: RRR,  no murmur, distal pulses intact. Abdomen:  Not distended, soft, non-tender. No rebound or rigidity. No mass,organomegaly Muscle skeletal: no pretibial edema bilaterally  No CVA tenderness Skin: Not pale. Not jaundice Neurologic:  alert & oriented X3.  Speech normal, gait appropriate for age and unassisted Psych--  Cognition and judgment appear intact.  Cooperative with normal attention  span and concentration.  Behavior appropriate. No anxious or depressed appearing. Rectal:  External abnormalities: none. Normal sphincter tone. No rectal masses or tenderness.  Stool brown  Prostate: Prostate gland firm and smooth, slt enlargement, no nodularity, tenderness, mass, asymmetry or induration.       Assessment & Plan:

## 2014-05-18 NOTE — Assessment & Plan Note (Signed)
Presents w/ nocturia, lack of constitutional symptoms, DDX includes: prostatic hypertrophy, prostatitis, UTI, others  Urinalysis was performed in clinic showing trace proteinuria and moderate ketonuria.  Weight loss likely from better lifestyle, but given weight loss and increase nocturia will check an A1c Plan: We will check BMP, A1c, and PSA as well to evaluate LUTS.  Will also send urine to be cultured.  Should bloodwork return normal we will monitor closely. Patient instructed to let us know if symptoms persist or worsen, in which case we will consider referral to Urology.  Otherwise patient informed to return in 07/2014 for physical exam.

## 2014-05-19 LAB — URINE CULTURE
Colony Count: NO GROWTH
Organism ID, Bacteria: NO GROWTH

## 2014-07-20 ENCOUNTER — Encounter: Payer: Self-pay | Admitting: Internal Medicine

## 2014-08-12 ENCOUNTER — Other Ambulatory Visit: Payer: Self-pay | Admitting: Internal Medicine

## 2014-08-22 ENCOUNTER — Encounter: Payer: Medicare Other | Admitting: Internal Medicine

## 2014-10-04 ENCOUNTER — Ambulatory Visit (INDEPENDENT_AMBULATORY_CARE_PROVIDER_SITE_OTHER): Payer: Medicare Other | Admitting: Internal Medicine

## 2014-10-04 ENCOUNTER — Ambulatory Visit (HOSPITAL_BASED_OUTPATIENT_CLINIC_OR_DEPARTMENT_OTHER)
Admission: RE | Admit: 2014-10-04 | Discharge: 2014-10-04 | Disposition: A | Discharge status: Home / Self Care | Payer: Medicare Other | Source: Ambulatory Visit | Attending: Internal Medicine | Admitting: Internal Medicine

## 2014-10-04 ENCOUNTER — Encounter: Payer: Self-pay | Admitting: Internal Medicine

## 2014-10-04 VITALS — BP 126/78 | HR 77 | Temp 98.3°F | Ht 66.0 in | Wt 186.4 lb

## 2014-10-04 DIAGNOSIS — K219 Gastro-esophageal reflux disease without esophagitis: Secondary | ICD-10-CM | POA: Insufficient documentation

## 2014-10-04 DIAGNOSIS — R0989 Other specified symptoms and signs involving the circulatory and respiratory systems: Secondary | ICD-10-CM | POA: Insufficient documentation

## 2014-10-04 DIAGNOSIS — R059 Cough, unspecified: Secondary | ICD-10-CM

## 2014-10-04 DIAGNOSIS — R509 Fever, unspecified: Secondary | ICD-10-CM | POA: Diagnosis not present

## 2014-10-04 DIAGNOSIS — R05 Cough: Secondary | ICD-10-CM

## 2014-10-04 DIAGNOSIS — J9811 Atelectasis: Secondary | ICD-10-CM | POA: Diagnosis not present

## 2014-10-04 MED ORDER — AZITHROMYCIN 250 MG PO TABS: ORAL_TABLET | ORAL | Status: DC

## 2014-10-04 NOTE — Progress Notes (Signed)
Pre visit review using our clinic review tool, if applicable. No additional management support is needed unless otherwise documented below in the visit note. 

## 2014-10-04 NOTE — Progress Notes (Signed)
Subjective:    Patient ID: Rick Campbell, male    DOB: [birth year], 69 y.o.   MRN: [id number]  DOS:  10/04/2014 Type of visit - description : Acute visit Interval history: Symptoms started approximately 10 days ago with cough, head congestion, chest congestion. Using Symbicort which is helping the symptoms to some extent. 4 days ago developed a low-grade temp that lasted 2 days, no more increased temperature. Taking Mucinex with mild help  Review of Systems + Chills when he had fever, no nasal discharge. +  sputum production, greenish in color, no hemoptysis. LUTS symptoms well controlled with Flomax + Aches and pains in the last few days  Past Medical History  Diagnosis Date  . Esophageal reflux   . Chronic headache   . Hyperlipidemia   . Allergy     seasonal  . Adenomatous polyp of colon   . Lower urinary tract symptoms (LUTS)     Past Surgical History  Procedure Laterality Date  . Cholecystectomy  1998  . Colonoscopy    . Upper gastrointestinal endoscopy      Social History   Social History  . Marital Status: Married    Spouse Name: N/A  . Number of Children: 2  . Years of Education: N/A   Occupational History  . retired     Pharmacologist   Social History Main Topics  . Smoking status: Never Smoker   . Smokeless tobacco: Never Used  . Alcohol Use: Yes     Comment: 1 or 2 glasses of wine every week  . Drug Use: No  . Sexual Activity: Not on file   Other Topics Concern  . Not on file   Social History Narrative   retired from [employer]  , lives w/ wife , both children in [place]                 Medication List       This list is accurate as of: 10/04/14 11:59 PM.  Always use your most recent med list.               aspirin 81 MG tablet  Take 81 mg by mouth daily.     atorvastatin 10 MG tablet  Commonly known as:  LIPITOR  Take 1 tablet (10 mg total) by mouth daily.     azelastine 0.1 % nasal spray  Commonly known as:  ASTELIN  Place 2 sprays  into both nostrils at bedtime.     azithromycin 250 MG tablet  Commonly known as:  ZITHROMAX Z-PAK  2 tabs a day the first day, then 1 tab a day x 4 days     budesonide-formoterol 80-4.5 MCG/ACT inhaler  Commonly known as:  SYMBICORT  Inhale 2 puffs into the lungs 2 (two) times daily.     famotidine 20 MG tablet  Commonly known as:  PEPCID  Take 20 mg by mouth daily.     NASONEX 50 MCG/ACT nasal spray  Generic drug:  mometasone  INSTILL 2 SPRAYS IN THE NOSE DAILY AS DIRECTED     RABEprazole 20 MG tablet  Commonly known as:  ACIPHEX  Take 1 tablet (20 mg total) by mouth daily.     SENIOR MULTIVITAMIN PLUS PO  Take 1 tablet by mouth daily.     tamsulosin 0.4 MG Caps capsule  Commonly known as:  FLOMAX  Take 1 capsule (0.4 mg total) by mouth daily.           Objective:  Physical Exam BP 126/78 mmHg  Pulse 77  Temp(Src) 98.3 F (36.8 C) (Oral)  Ht 5\' 6"  (1.676 m)  Wt 186 lb 6 oz (84.539 kg)  BMI 30.10 kg/m2  SpO2 94% General:   Well developed, well nourished . NAD.  HEENT:  Normocephalic . Face symmetric, atraumatic TMs normal, throat no redness, no discharge. Sinuses slightly TTP and the left maxillary area. Lungs:  Few rhonchi, decreased breath sounds at left base? Normal respiratory effort, no intercostal retractions, no accessory muscle use. Heart: RRR,  no murmur.  No pretibial edema bilaterally  Skin: Not pale. Not jaundice Neurologic:  alert & oriented X3.  Speech normal, gait appropriate for age and unassisted Psych--  Cognition and judgment appear intact.  Cooperative with normal attention span and concentration.  Behavior appropriate. No anxious or depressed appearing.      Assessment & Plan:   Cough: Several days history of cough, had low grade temperature, question of decreased breath sounds on exam, left side. Chest x-ray: Showed no pneumonia thus likely bronchitis Plan: Z-Pak, see instructions.

## 2014-10-04 NOTE — Patient Instructions (Signed)
   Stop by the first floor and get the XR    Rest, fluids , tylenol  For cough: Take Mucinex DM twice a day as needed until better   Continue Symbicort   For nasal congestion Use OTC Nasocort or Flonase : 2 nasal sprays on each side of the nose daily until you feel better    Call if not gradually better over the next  10 days  Call anytime if the symptoms are severe, you have high fever, short of breath, chest pain

## 2014-10-05 ENCOUNTER — Encounter: Payer: Self-pay | Admitting: Internal Medicine

## 2014-10-06 ENCOUNTER — Other Ambulatory Visit: Payer: Self-pay | Admitting: Internal Medicine

## 2014-10-06 ENCOUNTER — Encounter: Payer: Self-pay | Admitting: Internal Medicine

## 2014-10-06 MED ORDER — HYDROCODONE-HOMATROPINE 5-1.5 MG/5ML PO SYRP: 5.0000 mL | ORAL_SOLUTION | Freq: Three times a day (TID) | ORAL | Status: DC | PRN

## 2014-10-06 NOTE — Telephone Encounter (Signed)
Hydrocodone syrup Rx printed and placed at the front desk for pick up. Pt informed via MyChart.

## 2014-10-11 ENCOUNTER — Telehealth: Payer: Self-pay | Admitting: Internal Medicine

## 2014-10-11 MED ORDER — GUAIFENESIN-CODEINE 100-10 MG/5ML PO SOLN
5.0000 mL | Freq: Two times a day (BID) | ORAL | Status: DC | PRN
Start: 2014-10-11 — End: 2014-11-07

## 2014-10-11 MED ORDER — BENZONATATE 200 MG PO CAPS: 200.0000 mg | ORAL_CAPSULE | Freq: Three times a day (TID) | ORAL | Status: DC | PRN

## 2014-10-11 NOTE — Telephone Encounter (Signed)
Pt informed that Lavella Lemons has been sent to [facility] and Guaifenesin-codeine placed at front desk via [place]. Instructed Pt that if he is still not feeling better in several days, he will need to be seen again.

## 2014-10-11 NOTE — Telephone Encounter (Signed)
Patient reports continue with severe cough despite taking hydrocodone Advise patient: Continue Mucinex DM We'll send a prescription for Tessalon Perles and a cough suppressant with codeine ( watch for drowsiness) .

## 2014-10-18 DIAGNOSIS — D2262 Melanocytic nevi of left upper limb, including shoulder: Secondary | ICD-10-CM | POA: Diagnosis not present

## 2014-10-18 DIAGNOSIS — D2261 Melanocytic nevi of right upper limb, including shoulder: Secondary | ICD-10-CM | POA: Diagnosis not present

## 2014-10-18 DIAGNOSIS — L821 Other seborrheic keratosis: Secondary | ICD-10-CM | POA: Diagnosis not present

## 2014-10-18 DIAGNOSIS — D225 Melanocytic nevi of trunk: Secondary | ICD-10-CM | POA: Diagnosis not present

## 2014-10-18 DIAGNOSIS — L570 Actinic keratosis: Secondary | ICD-10-CM | POA: Diagnosis not present

## 2014-10-19 ENCOUNTER — Encounter: Payer: Self-pay | Admitting: Internal Medicine

## 2014-10-20 ENCOUNTER — Encounter: Payer: Self-pay | Admitting: Gastroenterology

## 2014-10-20 ENCOUNTER — Telehealth: Payer: Self-pay | Admitting: Internal Medicine

## 2014-10-20 MED ORDER — PREDNISONE 10 MG PO TABS: ORAL_TABLET | ORAL | Status: DC

## 2014-10-20 MED ORDER — AMOXICILLIN 500 MG PO CAPS: 1000.0000 mg | ORAL_CAPSULE | Freq: Two times a day (BID) | ORAL | Status: DC

## 2014-10-20 NOTE — Telephone Encounter (Signed)
Spoke with the patient: Has persisting daytime cough, frontal headaches, postnasal dripping, mild wheezing Status post Zithromax, chest x-ray showed no pneumonia. He probably has persisting infection (sinusitis) and bronchospasm. Plan: Second round of antibiotics with amoxicillin, prednisone, call if not gradually better in 2 weeks. Continue Symbicort

## 2014-10-30 ENCOUNTER — Other Ambulatory Visit: Payer: Self-pay | Admitting: Internal Medicine

## 2014-11-07 ENCOUNTER — Ambulatory Visit (INDEPENDENT_AMBULATORY_CARE_PROVIDER_SITE_OTHER): Payer: Medicare Other | Admitting: Internal Medicine

## 2014-11-07 ENCOUNTER — Encounter: Payer: Self-pay | Admitting: Internal Medicine

## 2014-11-07 VITALS — BP 102/66 | HR 69 | Temp 97.8°F | Ht 66.0 in | Wt 187.1 lb

## 2014-11-07 DIAGNOSIS — Z23 Encounter for immunization: Secondary | ICD-10-CM

## 2014-11-07 DIAGNOSIS — N400 Enlarged prostate without lower urinary tract symptoms: Secondary | ICD-10-CM

## 2014-11-07 DIAGNOSIS — E785 Hyperlipidemia, unspecified: Secondary | ICD-10-CM

## 2014-11-07 DIAGNOSIS — Z0000 Encounter for general adult medical examination without abnormal findings: Secondary | ICD-10-CM

## 2014-11-07 DIAGNOSIS — Z1159 Encounter for screening for other viral diseases: Secondary | ICD-10-CM | POA: Diagnosis not present

## 2014-11-07 DIAGNOSIS — Z09 Encounter for follow-up examination after completed treatment for conditions other than malignant neoplasm: Secondary | ICD-10-CM | POA: Insufficient documentation

## 2014-11-07 NOTE — Progress Notes (Signed)
Pre visit review using our clinic review tool, if applicable. No additional management support is needed unless otherwise documented below in the visit note. 

## 2014-11-07 NOTE — Patient Instructions (Signed)
Get your blood work before you leave   Next visit in one year, fasting for a physical exam.    Fall Prevention and Home Safety Falls cause injuries and can affect all age groups. It is possible to use preventive measures to significantly decrease the likelihood of falls. There are many simple measures which can make your home safer and prevent falls. OUTDOORS  Repair cracks and edges of walkways and driveways.  Remove high doorway thresholds.  Trim shrubbery on the main path into your home.  Have good outside lighting.  Clear walkways of tools, rocks, debris, and clutter.  Check that handrails are not broken and are securely fastened. Both sides of steps should have handrails.  Have leaves, snow, and ice cleared regularly.  Use sand or salt on walkways during winter months.  In the garage, clean up grease or oil spills. BATHROOM  Install night lights.  Install grab bars by the toilet and in the tub and shower.  Use non-skid mats or decals in the tub or shower.  Place a plastic non-slip stool in the shower to sit on, if needed.  Keep floors dry and clean up all water on the floor immediately.  Remove soap buildup in the tub or shower on a regular basis.  Secure bath mats with non-slip, double-sided rug tape.  Remove throw rugs and tripping hazards from the floors. BEDROOMS  Install night lights.  Make sure a bedside light is easy to reach.  Do not use oversized bedding.  Keep a telephone by your bedside.  Have a firm chair with side arms to use for getting dressed.  Remove throw rugs and tripping hazards from the floor. KITCHEN  Keep handles on pots and pans turned toward the center of the stove. Use back burners when possible.  Clean up spills quickly and allow time for drying.  Avoid walking on wet floors.  Avoid hot utensils and knives.  Position shelves so they are not too high or low.  Place commonly used objects within easy reach.  If  necessary, use a sturdy step stool with a grab bar when reaching.  Keep electrical cables out of the way.  Do not use floor polish or wax that makes floors slippery. If you must use wax, use non-skid floor wax.  Remove throw rugs and tripping hazards from the floor. STAIRWAYS  Never leave objects on stairs.  Place handrails on both sides of stairways and use them. Fix any loose handrails. Make sure handrails on both sides of the stairways are as long as the stairs.  Check carpeting to make sure it is firmly attached along stairs. Make repairs to worn or loose carpet promptly.  Avoid placing throw rugs at the top or bottom of stairways, or properly secure the rug with carpet tape to prevent slippage. Get rid of throw rugs, if possible.  Have an electrician put in a light switch at the top and bottom of the stairs. OTHER FALL PREVENTION TIPS  Wear low-heel or rubber-soled shoes that are supportive and fit well. Wear closed toe shoes.  When using a stepladder, make sure it is fully opened and both spreaders are firmly locked. Do not climb a closed stepladder.  Add color or contrast paint or tape to grab bars and handrails in your home. Place contrasting color strips on first and last steps.  Learn and use mobility aids as needed. Install an electrical emergency response system.  Turn on lights to avoid dark areas. Replace light bulbs  that burn out immediately. Get light switches that glow.  Arrange furniture to create clear pathways. Keep furniture in the same place.  Firmly attach carpet with non-skid or double-sided tape.  Eliminate uneven floor surfaces.  Select a carpet pattern that does not visually hide the edge of steps.  Be aware of all pets. OTHER HOME SAFETY TIPS  Set the water temperature for 120 F (48.8 C).  Keep emergency numbers on or near the telephone.  Keep smoke detectors on every level of the home and near sleeping areas. Document Released: 01/03/2002  Document Revised: 07/15/2011 Document Reviewed: 04/04/2011 [facility] Patient Information 2015 [place], [place]. This information is not intended to replace advice given to you by your health care provider. Make sure you discuss any questions you have with your health care provider.   Preventive Care for Adults Ages 57 and over  Blood pressure check.** / Every 1 to 2 years.  Lipid and cholesterol check.**/ Every 5 years beginning at age 81.  Lung cancer screening. / Every year if you are aged 44-80 years and have a 30-pack-year history of smoking and currently smoke or have quit within the past 15 years. Yearly screening is stopped once you have quit smoking for at least 15 years or develop a health problem that would prevent you from having lung cancer treatment.  Fecal occult blood test (FOBT) of stool. / Every year beginning at age 58 and continuing until age 53. You may not have to do this test if you get a colonoscopy every 10 years.  Flexible sigmoidoscopy** or colonoscopy.** / Every 5 years for a flexible sigmoidoscopy or every 10 years for a colonoscopy beginning at age 63 and continuing until age 72.  Hepatitis C blood test.** / For all people born from [birth year] through 1965 and any individual with known risks for hepatitis C.  Abdominal aortic aneurysm (AAA) screening.** / A one-time screening for ages 67 to 58 years who are current or former smokers.  Skin self-exam. / Monthly.  Influenza vaccine. / Every year.  Tetanus, diphtheria, and acellular pertussis (Tdap/Td) vaccine.** / 1 dose of Td every 10 years.  Varicella vaccine.** / Consult your health care provider.  Zoster vaccine.** / 1 dose for adults aged 73 years or older.  Pneumococcal 13-valent conjugate (PCV13) vaccine.** / Consult your health care provider.  Pneumococcal polysaccharide (PPSV23) vaccine.** / 1 dose for all adults aged 63 years and older.  Meningococcal vaccine.** / Consult your health care  provider.  Hepatitis A vaccine.** / Consult your health care provider.  Hepatitis B vaccine.** / Consult your health care provider.  Haemophilus influenzae type b (Hib) vaccine.** / Consult your health care provider. **Family history and personal history of risk and conditions may change your health care provider's recommendations. Document Released: 03/11/2001 Document Revised: 01/18/2013 Document Reviewed: 06/10/2010 [facility] Patient Information 2015 [place], [place]. This information is not intended to replace advice given to you by your health care provider. Make sure you discuss any questions you have with your health care provider.

## 2014-11-07 NOTE — Assessment & Plan Note (Addendum)
Td 2015, had a zostavax; Pneumonia shot 2012; prevnar 2015. Flu shot today   Colonoscopy:  , 06/27/2005 , 09-2010---->  1 polyp, next in 5 years  Prostate cancer screening: DRE 01-2013 wnl, last PSA 4/16 at baseline. Reassess next year recommend to continue with his healthy life style!

## 2014-11-07 NOTE — Assessment & Plan Note (Signed)
Hyperlipidemia: Continue Lipitor, check FLP, AST, ALT GERD: Continue PPIs. Patient asymptomatic. BPH: DRE 01-2013 wnl, last PSA 4/16 at baseline. On Flomax, doing well

## 2014-11-07 NOTE — Progress Notes (Signed)
Subjective:    Patient ID: Rick Campbell, male    DOB: 1945-11-30, 69 y.o.   MRN: [id number]  DOS:  11/07/2014 Type of visit - description :    Here for Medicare AWV: 1. Risk factors based on Past M, S, F history: reviewed 2. Physical Activities:  Very active , [employer] x 4-5 times a week , walks-golf  3. Depression/mood:  (-) screening 4. Hearing:  Saw ENT before, some hearing loss, tinnitus--->  Stable   5. ADL's:  Independent   6. Fall Risk: no recent falls, see instructions 7. home Safety: does feelsafe at home   8. Height, weight, &visual acuity: see VS, vision well corrected w/ glasses , sees the eye doctor   9. Counseling: provided 10. Labs ordered based on risk factors: if needed   11. Referral Coordination: if needed 12.  Care Plan, see assessment and plan   13.   Cognitive Assessment: motor skills and cognition wnl  14. Providers list updated 15. End-of-life care discussed, has a HC-POA  In addition we discussed the following: High cholesterol: Good compliance with Lipitor, seems controlled GERD: On PPIs daily, asymptomatic. BPH: On Flomax, asymptomatic.  Review of Systems Constitutional: No fever. No chills. No unexplained wt changes. No unusual sweats  HEENT: No dental problems, no ear discharge, no facial swelling, no voice changes. No eye discharge, no eye  redness , no  intolerance to light   Respiratory: No wheezing , no  difficulty breathing. No cough , no mucus production  Cardiovascular: No CP, no leg swelling , no  Palpitations  GI: no nausea, no vomiting, no diarrhea , no  abdominal pain.  No blood in the stools. No dysphagia, no odynophagia    Endocrine: No polyphagia, no polyuria , no polydipsia  GU: No dysuria, gross hematuria, difficulty urinating. No urinary urgency, no frequency.  Musculoskeletal: No joint swellings or unusual aches or pains  Skin: No change in the color of the skin, palor , no  Rash  Allergic, immunologic: No environmental  allergies , no  food allergies  Neurological: No dizziness no  syncope. No headaches. No diplopia, no slurred, no slurred speech, no motor deficits, no facial  Numbness  Hematological: No enlarged lymph nodes, no easy bruising , no unusual bleedings  Psychiatry: No suicidal ideas, no hallucinations, no beavior problems, no confusion.  No unusual/severe anxiety, no depression   Past Medical History  Diagnosis Date  . Esophageal reflux   . Chronic headache   . Hyperlipidemia   . Allergy     seasonal  . Adenomatous polyp of colon   . Lower urinary tract symptoms (LUTS)     Past Surgical History  Procedure Laterality Date  . Cholecystectomy  1998  . Colonoscopy    . Upper gastrointestinal endoscopy      Social History   Social History  . Marital Status: Married    Spouse Name: N/A  . Number of Children: 2  . Years of Education: N/A   Occupational History  . retiredAdvice worker   Social History Main Topics  . Smoking status: Never Smoker   . Smokeless tobacco: Never Used  . Alcohol Use: Yes     Comment: 1 or 2 glasses of wine every week  . Drug Use: No  . Sexual Activity: Not on file   Other Topics Concern  . Not on file   Social History Narrative   retired from [employer]  , lives w/  wife , both children in [place]              Family History  Problem Relation Age of Onset  . Colon cancer Father     dx age 58s  . Lung cancer Mother     was a heavy smoker  . COPD Neg Hx   . Arthritis Neg Hx   . Prostate cancer Neg Hx   . Diabetes Father   . Heart Problems Father     fathjer CHF in his 51s       Medication List       This list is accurate as of: 11/07/14  5:51 PM.  Always use your most recent med list.               aspirin 81 MG tablet  Take 81 mg by mouth daily.     atorvastatin 10 MG tablet  Commonly known as:  LIPITOR  Take 1 tablet (10 mg total) by mouth daily.     famotidine 20 MG tablet  Commonly known as:  PEPCID  Take  20 mg by mouth daily as needed.     RABEprazole 20 MG tablet  Commonly known as:  ACIPHEX  Take 1 tablet (20 mg total) by mouth daily.     SENIOR MULTIVITAMIN PLUS PO  Take 1 tablet by mouth daily.     tamsulosin 0.4 MG Caps capsule  Commonly known as:  FLOMAX  Take 1 capsule (0.4 mg total) by mouth daily.           Objective:   Physical Exam BP 102/66 mmHg  Pulse 69  Temp(Src) 97.8 F (36.6 C) (Oral)  Ht 5\' 6"  (1.676 m)  Wt 187 lb 2 oz (84.879 kg)  BMI 30.22 kg/m2  SpO2 98% General:   Well developed, well nourished . NAD.  HEENT:  Normocephalic . Face symmetric, atraumatic Neck: No thyromegaly, normal carotid pulses Lungs:  CTA B Normal respiratory effort, no intercostal retractions, no accessory muscle use. Heart: RRR,  no murmur.  no pretibial edema bilaterally  Abdomen:  Not distended, soft, non-tender. No rebound or rigidity. No bruit Skin: Not pale. Not jaundice Neurologic:  alert & oriented X3.  Speech normal, gait appropriate for age and unassisted Psych--  Cognition and judgment appear intact.  Cooperative with normal attention span and concentration.  Behavior appropriate. No anxious or depressed appearing.    Assessment & Plan:   Assessment> Hyperlipidemia GERD Seasonal allergies BPH, LUTS Headaches, chronic  Plan: Hyperlipidemia: Continue Lipitor, check FLP, AST, ALT GERD: Continue PPIs. Patient asymptomatic. BPH: chart reviewed, DRE 01-2013 wnl, last PSA 4/16 at baseline. On Flomax, doing well

## 2014-11-08 LAB — LIPID PANEL
CHOL/HDL RATIO: 3
Cholesterol: 174 mg/dL (ref 0–200)
HDL: 57.8 mg/dL (ref >39.00)
LDL Cholesterol: 105 mg/dL — ABNORMAL HIGH (ref 0–99)
NONHDL: 115.8
TRIGLYCERIDES: 55 mg/dL (ref 0.0–149.0)
VLDL: 11 mg/dL (ref 0.0–40.0)

## 2014-11-08 LAB — ALT: ALT: 35 U/L (ref 0–53)

## 2014-11-08 LAB — HEPATITIS C ANTIBODY: HCV Ab: NEGATIVE

## 2014-11-08 LAB — AST: AST: 22 U/L (ref 0–37)

## 2014-11-27 ENCOUNTER — Other Ambulatory Visit: Payer: Self-pay | Admitting: Internal Medicine

## 2015-01-05 ENCOUNTER — Ambulatory Visit (INDEPENDENT_AMBULATORY_CARE_PROVIDER_SITE_OTHER): Payer: Medicare Other | Admitting: Internal Medicine

## 2015-01-05 ENCOUNTER — Encounter: Payer: Self-pay | Admitting: Internal Medicine

## 2015-01-05 VITALS — BP 122/84 | HR 71 | Temp 98.1°F | Ht 66.0 in | Wt 192.0 lb

## 2015-01-05 DIAGNOSIS — R05 Cough: Secondary | ICD-10-CM | POA: Diagnosis not present

## 2015-01-05 DIAGNOSIS — R059 Cough, unspecified: Secondary | ICD-10-CM

## 2015-01-05 DIAGNOSIS — Z09 Encounter for follow-up examination after completed treatment for conditions other than malignant neoplasm: Secondary | ICD-10-CM

## 2015-01-05 MED ORDER — PREDNISONE 10 MG PO TABS: ORAL_TABLET | ORAL | Status: DC

## 2015-01-05 MED ORDER — HYDROMET 5-1.5 MG/5ML PO SYRP: 5.0000 mL | ORAL_SOLUTION | Freq: Four times a day (QID) | ORAL | Status: DC | PRN

## 2015-01-05 MED ORDER — AZELASTINE HCL 0.1 % NA SOLN: 2.0000 | Freq: Every evening | NASAL | Status: DC | PRN

## 2015-01-05 NOTE — Progress Notes (Signed)
Subjective:    Patient ID: Rick Campbell, male    DOB: July 17, 1945, 69 y.o.   MRN: [id number]  DOS:  01/05/2015 Type of visit - description : Acute visit, cough Interval history: He was seen in September with cough, chest x-ray was negative, took several weeks to improve but eventually symptoms cleared This time, the symptoms started 2 weeks ago: Moderate to severe sinus congestion bilaterally, + postnasal dripping, severe. Cough, mostly dry, intense and hard to stop.    Review of Systems  No fever or chills No flulike symptoms such as myalgias, nausea, vomiting. No sputum production. He uses inhalers as needed, has no history of any wheezing. Heartburn well controlled.   Past Medical History  Diagnosis Date  . Esophageal reflux   . Chronic headache   . Hyperlipidemia   . Allergy     seasonal  . Adenomatous polyp of colon   . Lower urinary tract symptoms (LUTS)     Past Surgical History  Procedure Laterality Date  . Cholecystectomy  1998  . Colonoscopy    . Upper gastrointestinal endoscopy      Social History   Social History  . Marital Status: Married    Spouse Name: N/A  . Number of Children: 2  . Years of Education: N/A   Occupational History  . retiredAdvice worker   Social History Main Topics  . Smoking status: Never Smoker   . Smokeless tobacco: Never Used  . Alcohol Use: Yes     Comment: 1 or 2 glasses of wine every week  . Drug Use: No  . Sexual Activity: Not on file   Other Topics Concern  . Not on file   Social History Narrative   retired from [employer]  , lives w/ wife , both children in [place]                 Medication List       This list is accurate as of: 01/05/15 11:59 PM.  Always use your most recent med list.               aspirin 81 MG tablet  Take 81 mg by mouth daily.     atorvastatin 10 MG tablet  Commonly known as:  LIPITOR  Take 1 tablet (10 mg total) by mouth daily.     azelastine 0.1 % nasal  spray  Commonly known as:  ASTELIN  Place 2 sprays into both nostrils at bedtime as needed for rhinitis. Use in each nostril as directed     famotidine 20 MG tablet  Commonly known as:  PEPCID  Take 20 mg by mouth daily as needed.     HYDROMET 5-1.5 MG/5ML syrup  Generic drug:  HYDROcodone-homatropine  Take 5 mLs by mouth every 6 (six) hours as needed.     mometasone 50 MCG/ACT nasal spray  Commonly known as:  NASONEX  Place 2 sprays into the nose daily.     predniSONE 10 MG tablet  Commonly known as:  DELTASONE  4 tablets x 2 days, 3 tabs x 2 days, 2 tabs x 2 days, 1 tab x 2 days     RABEprazole 20 MG tablet  Commonly known as:  ACIPHEX  Take 1 tablet (20 mg total) by mouth daily.     SENIOR MULTIVITAMIN PLUS PO  Take 1 tablet by mouth daily.     SYMBICORT 80-4.5 MCG/ACT inhaler  Generic drug:  budesonide-formoterol  Inhale  1 spray into the lungs daily as needed.     tamsulosin 0.4 MG Caps capsule  Commonly known as:  FLOMAX  Take 1 capsule (0.4 mg total) by mouth daily.           Objective:   Physical Exam BP 122/84 mmHg  Pulse 71  Temp(Src) 98.1 F (36.7 C) (Oral)  Ht 5\' 6"  (1.676 m)  Wt 192 lb (87.091 kg)  BMI 31.00 kg/m2  SpO2 97% General:   Well developed, well nourished . NAD.  HEENT:  Normocephalic . Face symmetric, atraumatic. TMs normal. Throat symmetric, no red. Nose quite congested, minimal TTP at the left maxillary area. Lungs:  CTA B, frequent cough noted. Normal respiratory effort, no intercostal retractions, no accessory muscle use. Heart: RRR,  no murmur.  No pretibial edema bilaterally  Skin: Not pale. Not jaundice Neurologic:  alert & oriented X3.  Speech normal, gait appropriate for age and unassisted Psych--  Cognition and judgment appear intact.  Cooperative with normal attention span and concentration.  Behavior appropriate. No anxious or depressed appearing.      Assessment & Plan:    Assessment> Hyperlipidemia GERD Seasonal allergies BPH, LUTS Headaches, chronic  PLAN: Cough:  since September the cough has resurface. On chart review, he has problem with cough on and off, saw pulmonary in 2013, DX classic upper airway cough syndrome. Was prescribed Symbicort as needed. At this point I will recommend around prednisone to decrease the PND and sinus  congestion burden , I believe postnasal dripping is playing a major wrong the cough. See instructions.

## 2015-01-05 NOTE — Patient Instructions (Signed)
Flonase 2 sprays in each side of the nose every morning  Astelin: 2 sprays in each side of the nose every night   mucinex DM as needed  Prednisone as prescribed  Symbicort twice a day until better  Call if not gradually improving  Hydrocodone as needed for severe cough, watch for drowsiness

## 2015-01-05 NOTE — Progress Notes (Signed)
Pre visit review using our clinic review tool, if applicable. No additional management support is needed unless otherwise documented below in the visit note. 

## 2015-01-07 NOTE — Assessment & Plan Note (Signed)
Cough:  since September the cough has resurface. On chart review, he has problem with cough on and off, saw pulmonary in 2013, DX classic upper airway cough syndrome. Was prescribed Symbicort as needed. At this point I will recommend around prednisone to decrease the PND and sinus  congestion burden , I believe postnasal dripping is playing a major wrong the cough. See instructions.

## 2015-01-11 ENCOUNTER — Encounter: Payer: Self-pay | Admitting: Internal Medicine

## 2015-01-11 DIAGNOSIS — R05 Cough: Secondary | ICD-10-CM

## 2015-01-11 DIAGNOSIS — R053 Chronic cough: Secondary | ICD-10-CM

## 2015-01-11 NOTE — Telephone Encounter (Signed)
Referral placed to Dr. Melvyn Novas.

## 2015-01-17 ENCOUNTER — Encounter: Payer: Self-pay | Admitting: Internal Medicine

## 2015-01-17 ENCOUNTER — Ambulatory Visit (INDEPENDENT_AMBULATORY_CARE_PROVIDER_SITE_OTHER): Payer: Medicare Other | Admitting: Internal Medicine

## 2015-01-17 VITALS — BP 132/78 | HR 84 | Ht 66.0 in | Wt 192.8 lb

## 2015-01-17 DIAGNOSIS — R058 Other specified cough: Secondary | ICD-10-CM

## 2015-01-17 DIAGNOSIS — R05 Cough: Secondary | ICD-10-CM

## 2015-01-17 DIAGNOSIS — J45991 Cough variant asthma: Secondary | ICD-10-CM

## 2015-01-17 DIAGNOSIS — K219 Gastro-esophageal reflux disease without esophagitis: Secondary | ICD-10-CM

## 2015-01-17 LAB — NITRIC OXIDE: NITRIC OXIDE: 17

## 2015-01-17 MED ORDER — PREDNISONE 10 MG PO TABS: ORAL_TABLET | ORAL | Status: DC

## 2015-01-17 MED ORDER — TRAMADOL HCL 50 MG PO TABS: ORAL_TABLET | ORAL | Status: DC

## 2015-01-17 NOTE — Patient Instructions (Addendum)
I emphasized that nasal steroids have no immediate benefit in terms of improving symptoms.  To help them reached the target tissue, the patient should use Afrin two puffs every 12 hours applied one min before using the nasal steroids.  Afrin should be stopped after no more than 5 days.  If the symptoms worsen, Afrin can be restarted after 5 days off of therapy to prevent rebound congestion from overuse of Afrin.  I also emphasized that in no way are nasal steroids a concern in terms of "addiction".   Symbicort 80 can be used 2 puffs every 12 hours as needed for difficulty breathing / wheezing  Work on inhaler technique:  relax and gently blow all the way out then take a nice smooth deep breath back in, triggering the inhaler at same time you start breathing in.  Hold for up to 5 seconds if you can. Blow out thru nose. Rinse and gargle with water when done  Aciphex 20 mg Take 30-60 min before first meal of the day and automatically pepcid 20 mg after supper  For drainage / throat tickle try take CHLORPHENIRAMINE  4 mg - take one every 4 hours as needed - available over the counter- may cause drowsiness so start with just a bedtime dose or two and see how you tolerate it before trying in daytime    Take delsym two tsp every 12 hours and supplement if needed with  tramadol 50 mg up to 2 every 4 hours to suppress the urge to cough. Swallowing water or using ice chips/non mint and menthol containing candies (such as lifesavers or sugarless jolly ranchers) are also effective.  You should rest your voice and avoid activities that you know make you cough.  Once you have eliminated the cough for 3 straight days try reducing the tramadol first,  then the delsym as tolerated.    Prednisone 10 mg take  4 each am x 2 days,   2 each am x 2 days,  1 each am x 2 days and stop   If not better return after the holidays to regroup

## 2015-01-17 NOTE — Progress Notes (Signed)
Subjective:    Patient ID: Rick Campbell, male    DOB: [birth year],    MRN: [id number]  HPI  HPI 17 yowm never smoker grew up with exposure to parents but no resp problems until age 69's and since then" every cold turns into to persistent cough to point of "gagging" resolves p weeks with abx , cough meds and steroids But in between always great for months with no rx. Eval by allergy ([place]) Pos for dust/ molds and cats on shots x 4-5 years with no effect on this pattern so stopped in 1987, referred to Pulmonary Clinic by Dr Larose Kells to address ? mech for cough.  05/08/2010 Initial pulmonary office eval cc all better now but wants to know how to prevent another recurrent cough p coughed from Nov2011 through March 2012 and tapering off nasal steroids. No purulent secretions.  >>tx w/ GERD /rhinitis prevention   10/21/2010 Acute OV / NP completely better until acute onset x 2 weeks with Sinus drainage, prod cough (yellow- green) Headaches for 2 weeks . Cough and congestion are getting worse. Sinus headache and congestion . OTC not helping. Pt increased his PPI and pepcid when cough started.  rec  Omnicef 300mg  Twice daily For 10 days , take with food Mucinex DM Twice daily As needed Cough/congestion  Saline nasal rinses As needed  Continue on Aciphex Twice daily And pepcid At bedtime Until cough is resolved.    11/04/2010 f/u ov/Jaxtyn Linville cc just finished the omnicef 10/7 weems to be getting worse with brown mucus now but minimal sob, seems worse in early am. Denies ever using saba, compliant with ppi and h2 hs. rec Symbicort 80 Take 2 puffs first thing in am and then another 2 puffs about 12 hours later.  Work on inhaler technique: .  Prednisone 10 mg take 4 each am x 2 days, 2 each am x 2 days, 1 each am x2days and stop Augmentin x 10 day    11/28/2010 f/u ov/Meliya Mcconahy cc minimal sinus drainage overall 95% improved on symbicort, sleeping fine, no sob or productive cough. Has used  omnaris before for sinus drainge and not sure it helped but can't get it any more.   Also denies any obvious fluctuation of symptoms with weather or environmental changes or other aggravating or alleviating factors except as outlined above   ROS At present neg for any significant sore throat, dysphagia, itching, sneezing, nasal congestion or excess/ purulent secretions, fever, chills, sweats, unintended wt loss, pleuritic or exertional cp, hempoptysis, orthopnea pnd or leg swelling. Also denies presyncope, palpitations, heartburn, abdominal pain, nausea, vomiting, diarrhea or change in bowel or urinary habits, dysuria,hematuria, rash, arthralgias, visual complaints, headache, numbness weakness or ataxia.        Objective:  Physical Exam   pleasant amb wm nad Wt 179 05/08/2010 >>184 10/21/2010 > 11/04/2010 182 > 11/28/2010 178   HEENT: nl dentition, and orophanx. Nl external ear canals without cough reflex. Mild nonspecific turbinate edema   NECK : without JVD/Nodes/TM/ nl carotid upstrokes bilaterally   LUNGS: insp and exp rhonchi with exp coughing   CV: RRR no s3 or murmur or increase in P2, no edema   ABD: soft and nontender with nl excursion in the supine position. No bruits or organomegaly, bowel sounds nl  MS: warm without deformities, calf tenderness, cyanosis or clubbing     cxr 04/15/10 No active cardiopulmonary disease.         Review of Systems  Constitutional: Negative.  Negative for fever and unexpected weight change.  HENT: Positive for congestion, postnasal drip, rhinorrhea, sore throat and voice change. Negative for dental problem, ear pain, nosebleeds, sinus pressure, sneezing and trouble swallowing.   Eyes: Negative.  Negative for redness and itching.  Respiratory: Positive for cough. Negative for chest tightness, shortness of breath and wheezing.   Cardiovascular: Negative.  Negative for palpitations and leg swelling.    Gastrointestinal: Negative.  Negative for nausea and vomiting.  Endocrine: Negative.   Genitourinary: Negative.  Negative for dysuria.  Musculoskeletal: Negative.  Negative for joint swelling.  Skin: Negative.  Negative for rash.  Allergic/Immunologic: Negative.   Neurological: Positive for headaches.  Hematological: Negative.  Does not bruise/bleed easily.  Psychiatric/Behavioral: Negative.  Negative for dysphoric mood. The patient is not nervous/anxious.        Objective:   Physical Exam        Assessment & Plan:

## 2015-01-17 NOTE — Progress Notes (Signed)
Subjective:    Patient ID: Rick Campbell, male    DOB: [birth year], 69    MRN: [id number]   Brief patient profile:  69  yowm never smoker grew up with exposure to parents but no resp problems until age 69's and since then" every cold turns into to persistent cough to point of "gagging" resolves p weeks with abx , cough meds and steroids  But in between always great for months with no rx.  Eval by allergy ([place]) Pos for dust/ molds and cats on shots x 4-5 years with no effect on this pattern so stopped in 1987,  referred to Pulmonary Clinic4/11/12 by Dr Larose Kells to address ? mech for cough.  W/u:   - Sinus CT 04/11/10 Negative paranasal sinuses   - Allergy eval 11/04/2010 >>>  Neg eos >>>  IgE  49.7 with pos screen for molds > dog dander   History of Present Illness  05/08/2010 Initial pulmonary office eval cc all better now but wants to know how to prevent another recurrent cough p coughed from Nov 2011 through March 2012 and tapering off nasal steroids.  No purulent secretions.  >>tx w/ gERD /rhinitis prevention     10/21/2010 Acute OV / NP completely better until acute onset x 2 weeks with Sinus drainage, prod cough (yellow- green) Headaches for 2 weeks . Cough and congestion are getting worse. Sinus headache and congestion . OTC not helping. Pt increased his PPI and pepcid when cough started.  rec  Omnicef 300mg  Twice daily  For 10 days , take with food Mucinex DM Twice daily  As needed  Cough/congestion  Saline nasal rinses As needed   Continue on Aciphex Twice daily  And pepcid At bedtime  Until cough is resolved.    11/04/2010 f/u ov/Kelty Szafran cc just finished the omnicef 10/7 weems to be getting worse with brown mucus now but minimal sob, seems worse in early am.  Denies ever using saba, compliant with ppi and h2 hs. rec I emphasized that nasal steroids have no immediate benefit in terms of improving symptoms.  To help them reached the target tissue, the patient should use Afrin two puffs  every 12 hours applied one min before using the nasal steroids.  Afrin should be stopped after no more than 5 days.  If the symptoms worsen, Afrin can be restarted after 5 days off of therapy to prevent rebound congestion from overuse of Afrin.  I also emphasized that in no way are nasal steroids a concern in terms of "addiction".  Symbicort 80 can be used 2 puffs every 12 hours as needed   01/17/2015 Pulmonary consultation / recurrent cough   Chief Complaint  Patient presents with  . PULMONARY CONSULT    Previous patient. Pt c/o nasal/sinus congestion, pnd, hoarseness and dry cough. Pt also reports gas/reflux burning sensations. Pt denies wheeze/SOB/CP/tightness.    Late sept 2016 onset persistent day > not mostly dry cough  On aciphex at bfast and pepcid mid am and then pepcid at supper but still overt hb symptoms since onset of cough  Cough starts around 10 am  is worse around 2 -4 in afteroon, taking makes it worse assoc with  Prior to sept aciphex maint but not symbicort astelin or flonase   Using flonase and afrin but not helping lots of nasal congestion/ obst symptoms    No obvious day to day or daytime variability or assoc sob or cp or chest tightness, subjective wheeze  . No unusual  exp hx or h/o childhood pna/ asthma or knowledge of premature birth.  Sleeping ok without nocturnal  or early am exacerbation  of respiratory  c/o's or need for noct saba. Also denies any obvious fluctuation of symptoms with weather or environmental changes or other aggravating or alleviating factors except as outlined above   Current Medications, Allergies, Complete Past Medical History, Past Surgical History, Family History, and Social History were reviewed in [employer] record.  ROS  The following are not active complaints unless bolded sore throat, dysphagia, dental problems, itching, sneezing,  nasal congestion or excess/ purulent secretions, ear ache,   fever, chills, sweats,  unintended wt loss, classically pleuritic or exertional cp, hemoptysis,  orthopnea pnd or leg swelling, presyncope, palpitations, abdominal pain, anorexia, nausea, vomiting, diarrhea  or change in bowel or bladder habits, change in stools or urine, dysuria,hematuria,  rash, arthralgias, visual complaints, headache, numbness, weakness or ataxia or problems with walking or coordination,  change in mood/affect or memory.                   Objective:   Physical Exam  amb wm nad / vital signs nl    Wt 179 05/08/2010 >>184 10/21/2010 > 11/04/2010 182 > 01/17/2015 192   HEENT: nl dentition,  and orophanx. Nl external ear canals without cough reflex. Mild nonspecific turbinate edema   NECK :  without JVD/Nodes/TM/ nl carotid upstrokes bilaterally   LUNGS: insp and exp rhonchi with exp coughing   CV:  RRR  no s3 or murmur or increase in P2, no edema   ABD:  soft and nontender with nl excursion in the supine position. No bruits or organomegaly, bowel sounds nl  MS:  warm without deformities, calf tenderness, cyanosis or clubbing      I personally reviewed images /radiology impression as follows:  CXR:  10/04/14 Acute bronchitic change with subsegmental atelectasis in the right infrahilar region. There is no alveolar pneumonia nor CHF. My impression:  No sign findings   Assessment & Plan:

## 2015-01-18 DIAGNOSIS — J45991 Cough variant asthma: Secondary | ICD-10-CM | POA: Insufficient documentation

## 2015-01-18 NOTE — Assessment & Plan Note (Signed)
01/17/2015  extensive coaching HFA effectiveness =    75% from a baseline of < 25%  NO 01/17/2015 = 17   Given this is a day > noct cough and low NO and nl exam I doubt this is cough variant asthma but it's certainly still possible  Best option is to use the lowest dose of ics as least frequently as possible so as not to exacerbate the main problem which I strongly favor to be UACS

## 2015-01-18 NOTE — Assessment & Plan Note (Signed)
Given instructions on optimal timing on aciphex H2's as well as use of H1's which have anticholinergic effects and may help with gerd related cough as well. If not better consider gi eval next

## 2015-01-18 NOTE — Assessment & Plan Note (Addendum)
- Sinus CT 04/11/10 Negative paranasal sinuses   - Allergy eval 11/04/2010 >>>  Neg eos >>>  IgE  49.7 with pos screen for molds > dog dander   - NO 01/17/2015  17 rules against allergic asthma    The most common causes of chronic cough in immunocompetent adults include the following: upper airway cough syndrome (UACS), previously referred to as postnasal drip syndrome (PNDS), which is caused by variety of rhinosinus conditions; (2) asthma; (3) GERD; (4) chronic bronchitis from cigarette smoking or other inhaled environmental irritants; (5) nonasthmatic eosinophilic bronchitis; and (6) bronchiectasis.   These conditions, singly or in combination, have accounted for up to 94% of the causes of chronic cough in prospective studies.   Other conditions have constituted no >6% of the causes in prospective studies These have included bronchogenic carcinoma, chronic interstitial pneumonia, sarcoidosis, left ventricular failure, ACEI-induced cough, and aspiration from a condition associated with pharyngeal dysfunction.    Chronic cough is often simultaneously caused by more than one condition. A single cause has been found from 38 to 82% of the time, multiple causes from 18 to 62%. Multiply caused cough has been the result of three diseases up to 42% of the time.       Based on hx and exam, this is most likely:  Classic Upper airway cough syndrome, so named because it's frequently impossible to sort out how much is  CR/sinusitis with freq throat clearing (which can be related to primary GERD)   vs  causing  secondary (" extra esophageal")  GERD from wide swings in gastric pressure that occur with throat clearing, often  promoting self use of mint and menthol lozenges that reduce the lower esophageal sphincter tone and exacerbate the problem further in a cyclical fashion.   These are the same pts (now being labeled as having "irritable larynx syndrome" by some cough centers) who not infrequently have a history  of having failed to tolerate ace inhibitors,  dry powder inhalers or biphosphonates or report having atypical reflux symptoms that don't respond to standard doses of PPI , and are easily confused as having aecopd or asthma flares by even experienced allergists/ pulmonologists.   The first step is to maximize acid suppression and eliminate cyclical coughing(which is promoting more reflux which is promoting more GERD)  then regroup p holidays  if the cough persists.  I had an extended discussion with the patient reviewing all relevant studies completed to date and  lasting 35 min  Explained the natural history of uri and why it's necessary in patients at risk to treat GERD aggressively - at least  short term -   to reduce risk of evolving cyclical cough initially  triggered by epithelial injury and a heightened sensitivty to the effects of any upper airway irritants,  most importantly acid - related - then perpetuated by epithelial injury related to the cough itself as the upper airway collapses on itself.  That is, the more sensitive the epithelium becomes once it is damaged by the virus, the more the ensuing irritability> the more the cough, the more the secondary reflux (especially in those prone to reflux) the more the irritation of the sensitive mucosa and so on in a  Classic cyclical pattern.    2) review of how to rx rhinitis aggressively using prn afrin    3)  Each maintenance medication was reviewed in detail including most importantly the difference between maintenance and prns and under what circumstances the prns are to  be triggered using an action plan format that is not reflected in the computer generated alphabetically organized AVS.    Please see instructions for details which were reviewed in writing and the patient given a copy highlighting the part that I personally wrote and discussed at today's ov.   See instructions for specific recommendations which were reviewed directly with  the patient who was given a copy with highlighter outlining the key components.

## 2015-01-18 NOTE — Assessment & Plan Note (Signed)
Complicated byHyperlipidemia/GERD  Body mass index is 31.13    Lab Results  Component Value Date   TSH 0.60 08/10/2013     Contributing to gerd tendency/ reviewed the need and the process to achieve and maintain neg calorie balance > defer f/u primary care including intermittently monitoring thyroid status

## 2015-01-26 ENCOUNTER — Other Ambulatory Visit: Payer: Self-pay | Admitting: Internal Medicine

## 2015-06-02 ENCOUNTER — Other Ambulatory Visit: Payer: Self-pay | Admitting: Internal Medicine

## 2015-06-12 DIAGNOSIS — H33311 Horseshoe tear of retina without detachment, right eye: Secondary | ICD-10-CM | POA: Diagnosis not present

## 2015-06-12 DIAGNOSIS — H2511 Age-related nuclear cataract, right eye: Secondary | ICD-10-CM | POA: Diagnosis not present

## 2015-06-12 DIAGNOSIS — H2512 Age-related nuclear cataract, left eye: Secondary | ICD-10-CM | POA: Diagnosis not present

## 2015-06-29 ENCOUNTER — Ambulatory Visit (HOSPITAL_BASED_OUTPATIENT_CLINIC_OR_DEPARTMENT_OTHER)
Admission: RE | Admit: 2015-06-29 | Discharge: 2015-06-29 | Disposition: A | Discharge status: Home / Self Care | Payer: Medicare Other | Source: Ambulatory Visit | Attending: Internal Medicine | Admitting: Internal Medicine

## 2015-06-29 ENCOUNTER — Encounter: Payer: Self-pay | Admitting: Internal Medicine

## 2015-06-29 ENCOUNTER — Ambulatory Visit (INDEPENDENT_AMBULATORY_CARE_PROVIDER_SITE_OTHER): Payer: Medicare Other | Admitting: Internal Medicine

## 2015-06-29 VITALS — BP 118/76 | HR 61 | Temp 98.1°F | Ht 66.0 in | Wt 169.2 lb

## 2015-06-29 DIAGNOSIS — M5489 Other dorsalgia: Secondary | ICD-10-CM

## 2015-06-29 DIAGNOSIS — E669 Obesity, unspecified: Secondary | ICD-10-CM

## 2015-06-29 DIAGNOSIS — M47896 Other spondylosis, lumbar region: Secondary | ICD-10-CM | POA: Diagnosis not present

## 2015-06-29 DIAGNOSIS — N400 Enlarged prostate without lower urinary tract symptoms: Secondary | ICD-10-CM

## 2015-06-29 DIAGNOSIS — Z09 Encounter for follow-up examination after completed treatment for conditions other than malignant neoplasm: Secondary | ICD-10-CM

## 2015-06-29 DIAGNOSIS — M160 Bilateral primary osteoarthritis of hip: Secondary | ICD-10-CM | POA: Diagnosis not present

## 2015-06-29 DIAGNOSIS — R399 Unspecified symptoms and signs involving the genitourinary system: Secondary | ICD-10-CM | POA: Diagnosis not present

## 2015-06-29 LAB — URINALYSIS, ROUTINE W REFLEX MICROSCOPIC
BILIRUBIN URINE: NEGATIVE
Ketones, ur: NEGATIVE
LEUKOCYTES UA: NEGATIVE
Nitrite: NEGATIVE
Specific Gravity, Urine: 1.01 (ref 1.000–1.030)
URINE GLUCOSE: NEGATIVE
UROBILINOGEN UA: 1 (ref 0.0–1.0)
pH: 7.5 (ref 5.0–8.0)

## 2015-06-29 LAB — PSA: PSA: 4.45 ng/mL — ABNORMAL HIGH (ref 0.10–4.00)

## 2015-06-29 NOTE — Assessment & Plan Note (Signed)
LUTS: Complained of nocturia 1, DRE today normal, check a PSA, UA urine culture. If workup negative recommend no further treatment unless he likes to see urology, treatment with other agents such as doxazosin may create problematic s/e. Back pain: Pain is located at the pelvic bone area, description does not fit classic hip pain. Will get x-ray of the area, Tylenol or Advil as needed, if not better refer to ortho Obesity: Doing great with diet RTC 10-2015 , CPX

## 2015-06-29 NOTE — Progress Notes (Signed)
Pre visit review using our clinic review tool, if applicable. No additional management support is needed unless otherwise documented below in the visit note. 

## 2015-06-29 NOTE — Patient Instructions (Addendum)
GO TO THE LAB : Get the blood work     GO TO THE FRONT DESK Schedule your next appointment for a  physical exam by October 2017    STOP BY THE FIRST FLOOR:  get the XR   Advil or Tylenol as needed for pain, call if no better. Referral?

## 2015-06-29 NOTE — Progress Notes (Signed)
Subjective:    Patient ID: Rick Campbell, male    DOB: 29-Jun-1945, 70 y.o.   MRN: [id number]  DOS:  06/29/2015 Type of visit - description : To discuss several issues Interval history: BPH: On Flomax for years, complaining of nocturia once and occasionally twice at night. Also, has developed pain at the right back, points to the pelvis rather than the hip. Sx worse when he sits down, no radiation, no paresthesias. No pain when he plays golf or walks.    Review of Systems + Weight loss, on purpose, counting calories, feels great. No dysuria, gross hematuria, difficulty urinating or urinary frequency  Past Medical History  Diagnosis Date  . Esophageal reflux   . Chronic headache   . Hyperlipidemia   . Allergy     seasonal  . Adenomatous polyp of colon   . Lower urinary tract symptoms (LUTS)     Past Surgical History  Procedure Laterality Date  . Cholecystectomy  1998  . Colonoscopy    . Upper gastrointestinal endoscopy      Social History   Social History  . Marital Status: Married    Spouse Name: N/A  . Number of Children: 2  . Years of Education: N/A   Occupational History  . retiredAdvice worker   Social History Main Topics  . Smoking status: Never Smoker   . Smokeless tobacco: Never Used  . Alcohol Use: Yes     Comment: 1 or 2 glasses of wine every week  . Drug Use: No  . Sexual Activity: Not on file   Other Topics Concern  . Not on file   Social History Narrative   retired from Home  , lives w/ wife , both children in [place]                 Medication List       This list is accurate as of: 06/29/15  3:24 PM.  Always use your most recent med list.               aspirin 81 MG tablet  Take 81 mg by mouth daily.     atorvastatin 10 MG tablet  Commonly known as:  LIPITOR  Take 1 tablet (10 mg total) by mouth daily.     azelastine 0.1 % nasal spray  Commonly known as:  ASTELIN  Place 2 sprays into both nostrils at bedtime as  needed for rhinitis. Use in each nostril as directed     famotidine 20 MG tablet  Commonly known as:  PEPCID  Take 20 mg by mouth daily as needed. Reported on 06/29/2015     fluticasone 50 MCG/ACT nasal spray  Commonly known as:  FLONASE  Place 1 spray into both nostrils daily. Reported on 06/29/2015     HYDROMET 5-1.5 MG/5ML syrup  Generic drug:  HYDROcodone-homatropine  Take 5 mLs by mouth every 6 (six) hours as needed.     predniSONE 10 MG tablet  Commonly known as:  DELTASONE  Take  4 each am x 2 days,   2 each am x 2 days,  1 each am x 2 days and stop     RABEprazole 20 MG tablet  Commonly known as:  ACIPHEX  Take 1 tablet (20 mg total) by mouth daily.     SENIOR MULTIVITAMIN PLUS PO  Take 1 tablet by mouth daily.     SYMBICORT 80-4.5 MCG/ACT inhaler  Generic drug:  budesonide-formoterol  Inhale 1 spray into the lungs daily as needed. Reported on 06/29/2015     tamsulosin 0.4 MG Caps capsule  Commonly known as:  FLOMAX  Take 1 capsule (0.4 mg total) by mouth daily.     traMADol 50 MG tablet  Commonly known as:  ULTRAM  1-2 every 4 hours as needed for cough or pain           Objective:   Physical Exam  Musculoskeletal:       Back:       Legs:  BP 118/76 mmHg  Pulse 61  Temp(Src) 98.1 F (36.7 C) (Oral)  Ht 5\' 6"  (1.676 m)  Wt 169 lb 4 oz (76.771 kg)  BMI 27.33 kg/m2  SpO2 99% General:   Well developed, well nourished . NAD.  HEENT:  Normocephalic . Face symmetric, atraumatic MSK:  No TTP at the right pelvis (see graphic), right back or sacroiliac area. Hips: Full range of motion without problems. Abdomen:  Not distended, soft, non-tender. No rebound or rigidity.  Rectal:  External abnormalities: none. Normal sphincter tone. No rectal masses or tenderness.  Stool brown  Prostate: Prostate gland firm and smooth, no enlargement, nodularity, tenderness, mass, asymmetry or induration.  Skin: Not pale. Not jaundice Neurologic:  alert & oriented X3.    Speech normal, gait appropriate for age and unassisted. DTRs symmetric Psych--  Cognition and judgment appear intact.  Cooperative with normal attention span and concentration.  Behavior appropriate. No anxious or depressed appearing.    Assessment & Plan:   Assessment> Hyperlipidemia GERD Seasonal allergies Obesity H/o increased PSA x1, LUTS Headaches, chronic  PLAN: LUTS: Complained of nocturia 1, DRE today normal, check a PSA, UA urine culture. If workup negative recommend no further treatment unless he likes to see urology, treatment with other agents such as doxazosin may create problematic s/e. Back pain: Pain is located at the pelvic bone area, description does not fit classic hip pain. Will get x-ray of the area, Tylenol or Advil as needed, if not better refer to ortho Obesity: Doing great with diet RTC 10-2015 , CPX

## 2015-06-30 LAB — URINE CULTURE
Colony Count: NO GROWTH
Organism ID, Bacteria: NO GROWTH

## 2015-08-30 ENCOUNTER — Encounter: Payer: Self-pay | Admitting: Gastroenterology

## 2015-09-18 ENCOUNTER — Encounter: Payer: Self-pay | Admitting: Gastroenterology

## 2015-10-17 DIAGNOSIS — D225 Melanocytic nevi of trunk: Secondary | ICD-10-CM | POA: Diagnosis not present

## 2015-10-17 DIAGNOSIS — L570 Actinic keratosis: Secondary | ICD-10-CM | POA: Diagnosis not present

## 2015-10-17 DIAGNOSIS — L821 Other seborrheic keratosis: Secondary | ICD-10-CM | POA: Diagnosis not present

## 2015-10-22 ENCOUNTER — Other Ambulatory Visit: Payer: Self-pay | Admitting: Internal Medicine

## 2015-11-01 ENCOUNTER — Other Ambulatory Visit: Payer: Self-pay | Admitting: Internal Medicine

## 2015-11-14 ENCOUNTER — Ambulatory Visit (INDEPENDENT_AMBULATORY_CARE_PROVIDER_SITE_OTHER): Payer: Medicare Other | Admitting: Internal Medicine

## 2015-11-14 ENCOUNTER — Ambulatory Visit: Payer: Medicare Other | Admitting: *Deleted

## 2015-11-14 ENCOUNTER — Encounter: Payer: Self-pay | Admitting: Internal Medicine

## 2015-11-14 VITALS — Ht 66.0 in | Wt 177.6 lb

## 2015-11-14 VITALS — BP 118/74 | HR 56 | Temp 97.6°F | Resp 14 | Ht 66.0 in | Wt 177.1 lb

## 2015-11-14 DIAGNOSIS — Z86010 Personal history of colonic polyps: Secondary | ICD-10-CM

## 2015-11-14 DIAGNOSIS — R972 Elevated prostate specific antigen [PSA]: Secondary | ICD-10-CM

## 2015-11-14 DIAGNOSIS — Z0000 Encounter for general adult medical examination without abnormal findings: Secondary | ICD-10-CM

## 2015-11-14 DIAGNOSIS — Z800 Family history of malignant neoplasm of digestive organs: Secondary | ICD-10-CM

## 2015-11-14 DIAGNOSIS — R399 Unspecified symptoms and signs involving the genitourinary system: Secondary | ICD-10-CM | POA: Diagnosis not present

## 2015-11-14 DIAGNOSIS — K219 Gastro-esophageal reflux disease without esophagitis: Secondary | ICD-10-CM

## 2015-11-14 DIAGNOSIS — Z23 Encounter for immunization: Secondary | ICD-10-CM | POA: Diagnosis not present

## 2015-11-14 DIAGNOSIS — R05 Cough: Secondary | ICD-10-CM

## 2015-11-14 DIAGNOSIS — E785 Hyperlipidemia, unspecified: Secondary | ICD-10-CM | POA: Diagnosis not present

## 2015-11-14 DIAGNOSIS — R059 Cough, unspecified: Secondary | ICD-10-CM

## 2015-11-14 LAB — LIPID PANEL
CHOLESTEROL: 168 mg/dL (ref 0–200)
HDL: 60.6 mg/dL (ref >39.00)
LDL Cholesterol: 97 mg/dL (ref 0–99)
NONHDL: 107.26
Total CHOL/HDL Ratio: 3
Triglycerides: 51 mg/dL (ref 0.0–149.0)
VLDL: 10.2 mg/dL (ref 0.0–40.0)

## 2015-11-14 LAB — CBC WITH DIFFERENTIAL/PLATELET
BASOS ABS: 0 10*3/uL (ref 0.0–0.1)
Basophils Relative: 0.3 % (ref 0.0–3.0)
Eosinophils Absolute: 0.1 10*3/uL (ref 0.0–0.7)
Eosinophils Relative: 1.6 % (ref 0.0–5.0)
HCT: 45.7 % (ref 39.0–52.0)
Hemoglobin: 15.4 g/dL (ref 13.0–17.0)
LYMPHS ABS: 1.6 10*3/uL (ref 0.7–4.0)
Lymphocytes Relative: 27.1 % (ref 12.0–46.0)
MCHC: 33.8 g/dL (ref 30.0–36.0)
MCV: 93.4 fl (ref 78.0–100.0)
MONO ABS: 0.5 10*3/uL (ref 0.1–1.0)
MONOS PCT: 8.6 % (ref 3.0–12.0)
NEUTROS ABS: 3.6 10*3/uL (ref 1.4–7.7)
NEUTROS PCT: 62.4 % (ref 43.0–77.0)
PLATELETS: 201 10*3/uL (ref 150.0–400.0)
RBC: 4.9 Mil/uL (ref 4.22–5.81)
RDW: 13.2 % (ref 11.5–15.5)
WBC: 5.8 10*3/uL (ref 4.0–10.5)

## 2015-11-14 LAB — BASIC METABOLIC PANEL
BUN: 11 mg/dL (ref 6–23)
CALCIUM: 9.6 mg/dL (ref 8.4–10.5)
CO2: 32 mEq/L (ref 19–32)
CREATININE: 0.85 mg/dL (ref 0.40–1.50)
Chloride: 103 mEq/L (ref 96–112)
GFR: 94.58 mL/min (ref >60.00)
Glucose, Bld: 88 mg/dL (ref 70–99)
Potassium: 4.2 mEq/L (ref 3.5–5.1)
Sodium: 141 mEq/L (ref 135–145)

## 2015-11-14 LAB — PSA: PSA: 4 ng/mL (ref 0.10–4.00)

## 2015-11-14 LAB — ALT: ALT: 28 U/L (ref 0–53)

## 2015-11-14 LAB — AST: AST: 21 U/L (ref 0–37)

## 2015-11-14 MED ORDER — NA SULFATE-K SULFATE-MG SULF 17.5-3.13-1.6 GM/177ML PO SOLN: 1.0000 | Freq: Once | ORAL | 0 refills | Status: AC

## 2015-11-14 NOTE — Progress Notes (Signed)
Denies allergies to eggs or soy products. Denies complications with sedation or anesthesia. Denies O2 use. Denies use of diet or weight loss medications.  Emmi instructions given for colonoscopy.  

## 2015-11-14 NOTE — Progress Notes (Signed)
Pre visit review using our clinic review tool, if applicable. No additional management support is needed unless otherwise documented below in the visit note. 

## 2015-11-14 NOTE — Progress Notes (Signed)
Subjective:    Patient ID: Rick Campbell, male    DOB: 07-25-1945, 70 y.o.   MRN: [id number]  DOS:  11/14/2015 Type of visit - description :   Here for Medicare AWV: 1. Risk factors based on Past M, S, F history: reviewed 2. Physical Activities: no change-->   Very active , [employer] x 4-5 times a week , walks-golf  3. Depression/mood:  (-) screening 4. Hearing:  some hearing loss, tinnitus slt worse--->   No recent ENT visit, offered referral  5. ADL's:  Independent   6. Fall Risk: no recent falls, see instructions 7. home Safety: does feelsafe at home   8. Height, weight, &visual acuity: see VS, vision well corrected w/ glasses , sees the eye doctor ~ 1 per year 9. Counseling: provided 10. Labs ordered based on risk factors: if needed   11. Referral Coordination: if needed 12.  Care Plan, see assessment and plan   13.   Cognitive Assessment: motor skills and cognition wnl  14. Providers list updated today 15. End-of-life care discussed, has a HC-POA  In addition we discussed the following: High cholesterol: Good med compliance. Cough: Currently asymptomatic, on as needed medications. GERD: Symptoms well controlled. Decrease dose?   Wt Readings from Last 3 Encounters:  11/14/15 177 lb 9.6 oz (80.6 kg)  11/14/15 177 lb 2 oz (80.3 kg)  06/29/15 169 lb 4 oz (76.8 kg)    Review of Systems Constitutional: No fever. No chills. No unexplained wt changes. No unusual sweats  HEENT: No dental problems, no ear discharge, no facial swelling, no voice changes. No eye discharge, no eye  redness , no  intolerance to light   Respiratory: No wheezing , no  difficulty breathing. No cough , no mucus production  Cardiovascular: No CP, no leg swelling , no  Palpitations  GI: no nausea, no vomiting, no diarrhea , no  abdominal pain.  No blood in the stools. No dysphagia, no odynophagia    Endocrine: No polyphagia, no polyuria , no polydipsia  GU: Some nocturia and occasional urinary  frequency but no dysuria or gross hematuria   Skin--no palor , no  Rash  Allergic, immunologic: No environmental allergies , no  food allergies  Neurological: No dizziness no  syncope. No headaches. No diplopia, no slurred, no slurred speech, no motor deficits, no facial  Numbness  Hematological: No enlarged lymph nodes, no easy bruising , no unusual bleedings  Psychiatry: No suicidal ideas, no hallucinations, no beavior problems, no confusion.  No unusual/severe anxiety, no depression   Past Medical History:  Diagnosis Date  . Adenomatous polyp of colon   . Allergy    seasonal  . Chronic headache   . Esophageal reflux   . Hyperlipidemia   . Lower urinary tract symptoms (LUTS)     Past Surgical History:  Procedure Laterality Date  . CHOLECYSTECTOMY  1998    Social History   Social History  . Marital status: Married    Spouse name: N/A  . Number of children: 2  . Years of education: N/A   Occupational History  . retiredAdvice worker   Social History Main Topics  . Smoking status: Never Smoker  . Smokeless tobacco: Never Used  . Alcohol use Yes     Comment: 1 or 2 glasses of wine every week  . Drug use: No  . Sexual activity: Not on file   Other Topics Concern  . Not on file  Social History Narrative   retired from [employer]  , lives w/ wife , both children in [place]              Family History  Problem Relation Age of Onset  . Colon cancer Father     dx age 37s  . Diabetes Father   . Heart Problems Father     father CHF in his 16s  . Lung cancer Mother     was a heavy smoker  . COPD Neg Hx   . Arthritis Neg Hx   . Prostate cancer Neg Hx        Medication List       Accurate as of 11/14/15 11:59 PM. Always use your most recent med list.          aspirin 81 MG tablet Take 81 mg by mouth daily.   atorvastatin 10 MG tablet Commonly known as:  LIPITOR Take 1 tablet (10 mg total) by mouth daily at 6 PM.   azelastine 0.1 % nasal  spray Commonly known as:  ASTELIN Place 2 sprays into both nostrils at bedtime as needed for rhinitis. Use in each nostril as directed   fluticasone 50 MCG/ACT nasal spray Commonly known as:  FLONASE Place 1 spray into both nostrils daily. Reported on 06/29/2015   Na Sulfate-K Sulfate-Mg Sulf 17.5-3.13-1.6 GM/180ML Soln Take 1 kit by mouth once. suprep as directed. No substitutions.   RABEprazole 20 MG tablet Commonly known as:  ACIPHEX Take 1 tablet (20 mg total) by mouth daily.   SENIOR MULTIVITAMIN PLUS PO Take 1 tablet by mouth daily.   SYMBICORT 80-4.5 MCG/ACT inhaler Generic drug:  budesonide-formoterol Inhale 1 spray into the lungs daily as needed. Reported on 06/29/2015   tamsulosin 0.4 MG Caps capsule Commonly known as:  FLOMAX Take 1 capsule (0.4 mg total) by mouth daily.          Objective:   Physical Exam BP 118/74 (BP Location: Left Arm, Patient Position: Sitting, Cuff Size: Normal)   Pulse (!) 56   Temp 97.6 F (36.4 C) (Oral)   Resp 14   Ht '5\' 6"'  (1.676 m)   Wt 177 lb 2 oz (80.3 kg)   SpO2 97%   BMI 28.59 kg/m   General:   Well developed, well nourished . NAD.  Neck: No  thyromegaly  HEENT:  Normocephalic . Face symmetric, atraumatic Lungs:  CTA B Normal respiratory effort, no intercostal retractions, no accessory muscle use. Heart: RRR,  no murmur.  No pretibial edema bilaterally  Abdomen:  Not distended, soft, non-tender. No rebound or rigidity.   Skin: Exposed areas without rash. Not pale. Not jaundice Neurologic:  alert & oriented X3.  Speech normal, gait appropriate for age and unassisted Strength symmetric and appropriate for age.  Psych: Cognition and judgment appear intact.  Cooperative with normal attention span and concentration.  Behavior appropriate. No anxious or depressed appearing.    Assessment & Plan:   Assessment Hyperlipidemia GERD Seasonal allergies Obesity H/o increased PSA x1, LUTS Headaches,  chronic  PLAN: Hyperlipidemia: Continue with Lipitor, check a BMP, AST, ALT an FLP GERD: Controlled, would like to start medication, recommend to gradually decrease and eventually stop PPIs. Use them as needed. Check CBC. cough-- saw pulmonology, on prn inhalers-nasal sprays. Obesity: Doing very well, has gained a few pounds due to recent vacation but plans to go back to a more healthier diet L UTS: On Flomax, still has mild sx but satisfied w/ results. No  change. Check a PSA.  RTC 1 year, sooner if needed

## 2015-11-14 NOTE — Patient Instructions (Signed)
Get your blood work before you leave   Next visit in one year, sooner if needed  Reflux medication: Decreased to 1 tablet every other day for 2 weeks, then stop. If you have more symptoms, restarted the medication for a few weeks.      Fall Prevention and Home Safety Falls cause injuries and can affect all age groups. It is possible to use preventive measures to significantly decrease the likelihood of falls. There are many simple measures which can make your home safer and prevent falls. OUTDOORS  Repair cracks and edges of walkways and driveways.  Remove high doorway thresholds.  Trim shrubbery on the main path into your home.  Have good outside lighting.  Clear walkways of tools, rocks, debris, and clutter.  Check that handrails are not broken and are securely fastened. Both sides of steps should have handrails.  Have leaves, snow, and ice cleared regularly.  Use sand or salt on walkways during winter months.  In the garage, clean up grease or oil spills. BATHROOM  Install night lights.  Install grab bars by the toilet and in the tub and shower.  Use non-skid mats or decals in the tub or shower.  Place a plastic non-slip stool in the shower to sit on, if needed.  Keep floors dry and clean up all water on the floor immediately.  Remove soap buildup in the tub or shower on a regular basis.  Secure bath mats with non-slip, double-sided rug tape.  Remove throw rugs and tripping hazards from the floors. BEDROOMS  Install night lights.  Make sure a bedside light is easy to reach.  Do not use oversized bedding.  Keep a telephone by your bedside.  Have a firm chair with side arms to use for getting dressed.  Remove throw rugs and tripping hazards from the floor. KITCHEN  Keep handles on pots and pans turned toward the center of the stove. Use back burners when possible.  Clean up spills quickly and allow time for drying.  Avoid walking on wet  floors.  Avoid hot utensils and knives.  Position shelves so they are not too high or low.  Place commonly used objects within easy reach.  If necessary, use a sturdy step stool with a grab bar when reaching.  Keep electrical cables out of the way.  Do not use floor polish or wax that makes floors slippery. If you must use wax, use non-skid floor wax.  Remove throw rugs and tripping hazards from the floor. STAIRWAYS  Never leave objects on stairs.  Place handrails on both sides of stairways and use them. Fix any loose handrails. Make sure handrails on both sides of the stairways are as long as the stairs.  Check carpeting to make sure it is firmly attached along stairs. Make repairs to worn or loose carpet promptly.  Avoid placing throw rugs at the top or bottom of stairways, or properly secure the rug with carpet tape to prevent slippage. Get rid of throw rugs, if possible.  Have an electrician put in a light switch at the top and bottom of the stairs. OTHER FALL PREVENTION TIPS  Wear low-heel or rubber-soled shoes that are supportive and fit well. Wear closed toe shoes.  When using a stepladder, make sure it is fully opened and both spreaders are firmly locked. Do not climb a closed stepladder.  Add color or contrast paint or tape to grab bars and handrails in your home. Place contrasting color strips on first and  last steps.  Learn and use mobility aids as needed. Install an electrical emergency response system.  Turn on lights to avoid dark areas. Replace light bulbs that burn out immediately. Get light switches that glow.  Arrange furniture to create clear pathways. Keep furniture in the same place.  Firmly attach carpet with non-skid or double-sided tape.  Eliminate uneven floor surfaces.  Select a carpet pattern that does not visually hide the edge of steps.  Be aware of all pets. OTHER HOME SAFETY TIPS  Set the water temperature for 120 F (48.8 C).  Keep  emergency numbers on or near the telephone.  Keep smoke detectors on every level of the home and near sleeping areas. Document Released: 01/03/2002 Document Revised: 07/15/2011 Document Reviewed: 04/04/2011 [facility] Patient Information 2015 [employer], [place]. This information is not intended to replace advice given to you by your health care provider. Make sure you discuss any questions you have with your health care provider.   Preventive Care for Adults Ages 38 and over  Blood pressure check.** / Every 1 to 2 years.  Lipid and cholesterol check.**/ Every 5 years beginning at age 40.  Lung cancer screening. / Every year if you are aged 67-80 years and have a 30-pack-year history of smoking and currently smoke or have quit within the past 15 years. Yearly screening is stopped once you have quit smoking for at least 15 years or develop a health problem that would prevent you from having lung cancer treatment.  Fecal occult blood test (FOBT) of stool. / Every year beginning at age 48 and continuing until age 42. You may not have to do this test if you get a colonoscopy every 10 years.  Flexible sigmoidoscopy** or colonoscopy.** / Every 5 years for a flexible sigmoidoscopy or every 10 years for a colonoscopy beginning at age 60 and continuing until age 90.  Hepatitis C blood test.** / For all people born from [birth year] through 1965 and any individual with known risks for hepatitis C.  Abdominal aortic aneurysm (AAA) screening.** / A one-time screening for ages 68 to 66 years who are current or former smokers.  Skin self-exam. / Monthly.  Influenza vaccine. / Every year.  Tetanus, diphtheria, and acellular pertussis (Tdap/Td) vaccine.** / 1 dose of Td every 10 years.  Varicella vaccine.** / Consult your health care provider.  Zoster vaccine.** / 1 dose for adults aged 30 years or older.  Pneumococcal 13-valent conjugate (PCV13) vaccine.** / Consult your health care provider.  Pneumococcal  polysaccharide (PPSV23) vaccine.** / 1 dose for all adults aged 54 years and older.  Meningococcal vaccine.** / Consult your health care provider.  Hepatitis A vaccine.** / Consult your health care provider.  Hepatitis B vaccine.** / Consult your health care provider.  Haemophilus influenzae type b (Hib) vaccine.** / Consult your health care provider. **Family history and personal history of risk and conditions may change your health care provider's recommendations. Document Released: 03/11/2001 Document Revised: 01/18/2013 Document Reviewed: 06/10/2010 [facility] Patient Information 2015 [place], [place]. This information is not intended to replace advice given to you by your health care provider. Make sure you discuss any questions you have with your health care provider.

## 2015-11-14 NOTE — Assessment & Plan Note (Addendum)
Td 2015, had a zostavax; Pneumonia shot 2012; prevnar 2015. Flu shot today   Colonoscopy:  , 06/27/2005 , 09-2010---->  1 polyp, has already schedule a f/u cscope Prostate cancer screening: 06-2015----> DRE was wnl,  PSA was a slightly elevated ---> recheck in today recommend to continue with his healthy life style!

## 2015-11-15 NOTE — Assessment & Plan Note (Signed)
Hyperlipidemia: Continue with Lipitor, check a BMP, AST, ALT an FLP GERD: Controlled, would like to start medication, recommend to gradually decrease and eventually stop PPIs. Use them as needed. Check CBC. cough-- saw pulmonology, on prn inhalers-nasal sprays. Obesity: Doing very well, has gained a few pounds due to recent vacation but plans to go back to a more healthier diet L UTS: On Flomax, still has mild sx but satisfied w/ results. No change. Check a PSA.  RTC 1 year, sooner if needed

## 2015-11-19 ENCOUNTER — Telehealth: Payer: Self-pay | Admitting: Gastroenterology

## 2015-11-20 NOTE — Telephone Encounter (Signed)
A pay no more than$ 50 coupon was faxed to the pts pharmacy.

## 2015-11-28 ENCOUNTER — Ambulatory Visit (AMBULATORY_SURGERY_CENTER): Payer: Medicare Other | Admitting: Gastroenterology

## 2015-11-28 ENCOUNTER — Encounter: Payer: Self-pay | Admitting: Gastroenterology

## 2015-11-28 VITALS — BP 90/67 | HR 83 | Temp 98.0°F | Resp 20 | Ht 66.0 in | Wt 177.0 lb

## 2015-11-28 DIAGNOSIS — E669 Obesity, unspecified: Secondary | ICD-10-CM | POA: Diagnosis not present

## 2015-11-28 DIAGNOSIS — Z86010 Personal history of colonic polyps: Secondary | ICD-10-CM | POA: Diagnosis not present

## 2015-11-28 DIAGNOSIS — Z800 Family history of malignant neoplasm of digestive organs: Secondary | ICD-10-CM | POA: Diagnosis not present

## 2015-11-28 DIAGNOSIS — K219 Gastro-esophageal reflux disease without esophagitis: Secondary | ICD-10-CM | POA: Diagnosis not present

## 2015-11-28 DIAGNOSIS — N400 Enlarged prostate without lower urinary tract symptoms: Secondary | ICD-10-CM | POA: Diagnosis not present

## 2015-11-28 MED ORDER — SODIUM CHLORIDE 0.9 % IV SOLN: 500.0000 mL | INTRAVENOUS | Status: DC

## 2015-11-28 NOTE — Progress Notes (Signed)
Report to PACU, RN, vss, BBS= Clear.  

## 2015-11-28 NOTE — Patient Instructions (Signed)
YOU HAD AN ENDOSCOPIC PROCEDURE TODAY AT THE [facility]:   Refer to the procedure report that was given to you for any specific questions about what was found during the examination.  If the procedure report does not answer your questions, please call your gastroenterologist to clarify.  If you requested that your care partner not be given the details of your procedure findings, then the procedure report has been included in a sealed envelope for you to review at your convenience later.  YOU SHOULD EXPECT: Some feelings of bloating in the abdomen. Passage of more gas than usual.  Walking can help get rid of the air that was put into your GI tract during the procedure and reduce the bloating. If you had a lower endoscopy (such as a colonoscopy or flexible sigmoidoscopy) you may notice spotting of blood in your stool or on the toilet paper. If you underwent a bowel prep for your procedure, you may not have a normal bowel movement for a few days.  Please Note:  You might notice some irritation and congestion in your nose or some drainage.  This is from the oxygen used during your procedure.  There is no need for concern and it should clear up in a day or so.  SYMPTOMS TO REPORT IMMEDIATELY:   Following lower endoscopy (colonoscopy or flexible sigmoidoscopy):  Excessive amounts of blood in the stool  Significant tenderness or worsening of abdominal pains  Swelling of the abdomen that is new, acute  Fever of 100F or higher  For urgent or emergent issues, a gastroenterologist can be reached at any hour by calling [phone].   DIET:  We do recommend a small meal at first, but then you may proceed to your regular diet.  Drink plenty of fluids but you should avoid alcoholic beverages for 24 hours.  ACTIVITY:  You should plan to take it easy for the rest of today and you should NOT DRIVE or use heavy machinery until tomorrow (because of the sedation medicines used during the test).     FOLLOW UP: Our staff will call the number listed on your records the next business day following your procedure to check on you and address any questions or concerns that you may have regarding the information given to you following your procedure. If we do not reach you, we will leave a message.  However, if you are feeling well and you are not experiencing any problems, there is no need to return our call.  We will assume that you have returned to your regular daily activities without incident.  If any biopsies were taken you will be contacted by phone or by letter within the next 1-3 weeks.  Please call us at [phone] if you have not heard about the biopsies in 3 weeks.    SIGNATURES/CONFIDENTIALITY: You and/or your care partner have signed paperwork which will be entered into your electronic medical record.  These signatures attest to the fact that that the information above on your After Visit Summary has been reviewed and is understood.  Full responsibility of the confidentiality of this discharge information lies with you and/or your care-partner. 

## 2015-11-28 NOTE — Op Note (Signed)
[place] Patient Name: Rick Campbell Procedure Date: 11/28/2015 11:39 AM MRN: [id number] Endoscopist: [place]. Loletha Carrow , MD Age: 69 Referring MD:  Date of Birth: 1945/10/05 Gender: Male Account #: [id number] Procedure:                Colonoscopy Indications:              Surveillance: Personal history of adenomatous                            polyps on last colonoscopy 5 years ago (68mm TA in                            09/2010) (there is also a family history of colon                            cancer in father - Dx early 30's) Medicines:                Monitored Anesthesia Care Procedure:                Pre-Anesthesia Assessment:                           - Prior to the procedure, a History and Physical                            was performed, and patient medications and                            allergies were reviewed. The patient's tolerance of                            previous anesthesia was also reviewed. The risks                            and benefits of the procedure and the sedation                            options and risks were discussed with the patient.                            All questions were answered, and informed consent                            was obtained. Prior Anticoagulants: The patient has                            taken no previous anticoagulant or antiplatelet                            agents. ASA Grade Assessment: II - A patient with                            mild systemic disease. After reviewing the risks  and benefits, the patient was deemed in                            satisfactory condition to undergo the procedure.                           After obtaining informed consent, the colonoscope                            was passed under direct vision. Throughout the                            procedure, the patient's blood pressure, pulse, and                            oxygen saturations were monitored  continuously. The                            Model CF-HQ190L [id number]) scope was introduced                            through the anus and advanced to the the cecum,                            identified by appendiceal orifice and ileocecal                            valve. The colonoscopy was performed without                            difficulty. The patient tolerated the procedure                            well. The quality of the bowel preparation was                            good. The ileocecal valve, appendiceal orifice, and                            rectum were photographed. The quality of the bowel                            preparation was evaluated using the BBPS [facility]                            Bowel Preparation Scale) with scores of: Right                            Colon = 2, Transverse Colon = 3 and Left Colon = 2.                            The total BBPS score equals 7. The bowel  preparation used was SUPREP. Scope In: 11:49:40 AM Scope Out: 12:04:45 PM Scope Withdrawal Time: 0 hours 8 minutes 58 seconds  Total Procedure Duration: 0 hours 15 minutes 5 seconds  Findings:                 The perianal and digital rectal examinations were                            normal.                           The entire examined colon appeared normal on direct                            and retroflexion views. Complications:            No immediate complications. Estimated Blood Loss:     Estimated blood loss: none. Impression:               - The entire examined colon is normal on direct and                            retroflexion views.                           - No specimens collected. Recommendation:           - Resume previous diet.                           - Continue present medications.                           - Repeat colonoscopy in 5 years for screening                            purposes due to family history noted above. Henry L.  Loletha Carrow, MD 11/28/2015 12:12:19 PM This report has been signed electronically.

## 2015-11-29 ENCOUNTER — Telehealth: Payer: Self-pay

## 2015-11-29 NOTE — Telephone Encounter (Signed)
  Follow up Call-  Call back number 11/28/2015  Post procedure Call Back phone  # [phone]  Permission to leave phone message Yes  Some recent data might be hidden    Patient was called for follow up after his procedure on 11/28/2015. No answer at the number given for follow up phone call. A message was left on the answering machine.

## 2015-11-29 NOTE — Telephone Encounter (Signed)
  Follow up Call-  Call back number 11/28/2015  Post procedure Call Back phone  # [phone]  Permission to leave phone message Yes  Some recent data might be hidden    Patient was called for follow up after his procedure on 11/28/2015. No answer at the number given for follow up phone call. A message was left on the answering machine.

## 2016-01-07 ENCOUNTER — Telehealth: Payer: Self-pay | Admitting: Internal Medicine

## 2016-01-07 NOTE — Telephone Encounter (Signed)
Can't solve this over the phone, needs ov with all meds in hand to see me or NP, ok to add on

## 2016-01-07 NOTE — Telephone Encounter (Signed)
Called and spoke to pt. Informed him of the recs per MW. Appt made with MW on 01/09/16 at 130. Pt states he could not come any sooner due to other scheduling conflicts. Nothing further needed at this time.

## 2016-01-07 NOTE — Telephone Encounter (Signed)
LMOMTCB x 1 

## 2016-01-07 NOTE — Telephone Encounter (Signed)
Email recd from the patient.    I have had the same problem that I saw Dr. Melvyn Novas about a year ago. Constant sinus dripping, hacking cough, headache, tightness/constant tickle in my throat. Cough is productive in morning; producing a greenish, yellow glob. I followed the same regime he prescribed at that time since Thanksgiving, about 2+ weeks. The coughing at night, and the amount of phlegm is less; that seems to be the only thing having changed. I am out of the tramadol.    Thanks for any feedback, advice, or an appointment.    Rick Campbell

## 2016-01-07 NOTE — Telephone Encounter (Signed)
Patient returned phone call..contact [phone].Rick Campbell

## 2016-01-07 NOTE — Telephone Encounter (Signed)
MW  Please advise- Sick Email  I have had the same problem that I saw Dr. Melvyn Novas about a year ago. Constant sinus dripping, hacking cough, headache, tightness/constant tickle in my throat. Cough is productive in morning; producing a greenish, yellow glob. I followed the same regime he prescribed at that time since Thanksgiving, about 2+ weeks. The coughing at night, and the amount of phlegm is less; that seems to be the only thing having changed. I am out of the tramadol.    Thanks for any feedback, advice, or an appointment.    Rick Campbell

## 2016-01-09 ENCOUNTER — Ambulatory Visit (INDEPENDENT_AMBULATORY_CARE_PROVIDER_SITE_OTHER): Payer: Medicare Other | Admitting: Internal Medicine

## 2016-01-09 ENCOUNTER — Ambulatory Visit: Payer: Medicare Other | Admitting: Internal Medicine

## 2016-01-09 ENCOUNTER — Encounter: Payer: Self-pay | Admitting: Internal Medicine

## 2016-01-09 VITALS — BP 118/74 | HR 82 | Temp 97.9°F | Ht 66.0 in | Wt 177.0 lb

## 2016-01-09 DIAGNOSIS — R05 Cough: Secondary | ICD-10-CM | POA: Diagnosis not present

## 2016-01-09 DIAGNOSIS — R058 Other specified cough: Secondary | ICD-10-CM

## 2016-01-09 MED ORDER — AMOXICILLIN-POT CLAVULANATE 875-125 MG PO TABS: 1.0000 | ORAL_TABLET | Freq: Two times a day (BID) | ORAL | 0 refills | Status: AC

## 2016-01-09 MED ORDER — PREDNISONE 10 MG PO TABS: ORAL_TABLET | ORAL | 0 refills | Status: DC

## 2016-01-09 MED ORDER — TRAMADOL HCL 50 MG PO TABS: ORAL_TABLET | ORAL | 0 refills | Status: DC

## 2016-01-09 NOTE — Patient Instructions (Addendum)
Try prilosec otc 20mg  x 2 (or Aciphex)   Take 30-60 min before first meal of the day and Pepcid ac (famotidine) 20 mg one @  bedtime until cough is completely gone for at least a week without the need for cough suppression  GERD (REFLUX)  is an extremely common cause of respiratory symptoms just like yours , many times with no obvious heartburn at all.    It can be treated with medication, but also with lifestyle changes including elevation of the head of your bed (ideally with 6 inch  bed blocks),  Smoking cessation, avoidance of late meals, excessive alcohol, and avoid fatty foods, chocolate, peppermint, colas, red wine, and acidic juices such as orange juice.  NO MINT OR MENTHOL PRODUCTS SO NO COUGH DROPS   USE SUGARLESS CANDY INSTEAD (Jolley ranchers or Stover's or Life Savers) or even ice chips will also do - the key is to swallow to prevent all throat clearing. NO OIL BASED VITAMINS - use powdered substitutes.   Augmentin 875 mg take one pill twice daily  X 10 days - take at breakfast and supper with large glass of water.  It would help reduce the usual side effects (diarrhea and yeast infections) if you ate cultured yogurt at lunch.   Prednisone 10 mg take  4 each am x 2 days,   2 each am x 2 days,  1 each am x 2 days and stop   Take delsym two tsp every 12 hours and supplement if needed with  tramadol 50 mg up to 2 every 4 hours to suppress the urge to cough. Swallowing water or using ice chips/non mint and menthol containing candies (such as lifesavers or sugarless jolly ranchers) are also effective.  You should rest your voice and avoid activities that you know make you cough.  Once you have eliminated the cough for 3 straight days try reducing the tramadol first,  then the delsym as tolerated.   Return if not 100% better in 2 weeks

## 2016-01-09 NOTE — Progress Notes (Signed)
Subjective:    Patient ID: Rick Campbell, male    DOB: 1945-08-20, 70    MRN: [id number]   Brief patient profile:  70  yowm never smoker grew up with exposure to parents but no resp problems until age 70's and since then" every cold turns into to persistent cough to point of "gagging" resolves p weeks with abx , cough meds and steroids  But in between always great for months with no rx.  Eval by allergy ([place]) Pos for dust/ molds and cats on shots x 4-5 years with no effect on this pattern so stopped in 1987,  referred to [facility]/11/12 by Dr Larose Kells to address ? mech for cough.  W/u:   - Sinus CT 04/11/10 Negative paranasal sinuses   - Allergy eval 11/04/2010 >>>  Neg eos >>>  IgE  49.7 with pos screen for molds > dog dander   History of Present Illness  05/08/2010 Initial pulmonary office eval cc all better now but wants to know how to prevent another recurrent cough p coughed from Nov 2011 through March 2012 and tapering off nasal steroids.  No purulent secretions.  >>tx w/ gERD /rhinitis prevention     10/21/2010 Acute OV / NP completely better until acute onset x 2 weeks with Sinus drainage, prod cough (yellow- green) Headaches for 2 weeks . Cough and congestion are getting worse. Sinus headache and congestion . OTC not helping. Pt increased his PPI and pepcid when cough started.  rec  Omnicef 300mg  Twice daily  For 10 days , take with food Mucinex DM Twice daily  As needed  Cough/congestion  Saline nasal rinses As needed   Continue on Aciphex Twice daily  And pepcid At bedtime  Until cough is resolved.    11/04/2010 f/u ov/Wert cc just finished the omnicef 10/7 weems to be getting worse with brown mucus now but minimal sob, seems worse in early am.  Denies ever using saba, compliant with ppi and h2 hs. rec I emphasized that nasal steroids have no immediate benefit in terms of improving symptoms.  To help them reached the target tissue, the patient should use Afrin two puffs  every 12 hours applied one min before using the nasal steroids.  Afrin should be stopped after no more than 5 days.  If the symptoms worsen, Afrin can be restarted after 5 days off of therapy to prevent rebound congestion from overuse of Afrin.  I also emphasized that in no way are nasal steroids a concern in terms of "addiction".  Symbicort 80 can be used 2 puffs every 12 hours as needed   01/17/2015 Pulmonary consultation / recurrent cough   Chief Complaint  Patient presents with  . PULMONARY CONSULT    Previous patient. Pt c/o nasal/sinus congestion, pnd, hoarseness and dry cough. Pt also reports gas/reflux burning sensations. Pt denies wheeze/SOB/CP/tightness.    Late sept 2016 onset persistent day >  mostly dry cough  On aciphex at bfast and pepcid mid am and then pepcid at supper but still overt hb symptoms since onset of cough  Cough starts around 10 am  is worse around 2 -4 in afteroon, taking makes it worse assoc with  Prior to sept aciphex maint but not symbicort astelin or flonase   Using flonase and afrin but not helping lots of nasal congestion/ obst symptoms  rec I emphasized that nasal steroids have no immediate benefit in terms of improving symptoms.  To help them reached the target tissue, the  patient should use Afrin two puffs every 12 hours applied one min before using the nasal steroids.  Afrin should be stopped after no more than 5 days.  If the symptoms worsen, Afrin can be restarted after 5 days off of therapy to prevent rebound congestion from overuse of Afrin.  I also emphasized that in no way are nasal steroids a concern in terms of "addiction".  Symbicort 80 can be used 2 puffs every 12 hours as needed for difficulty breathing / wheezing Work on inhaler technique:   Aciphex 20 mg Take 30-60 min before first meal of the day and automatically pepcid 20 mg after supper For drainage / throat tickle try take CHLORPHENIRAMINE  4 mg - take one every 4 hours as needed -  available over the counter- may cause drowsiness so start with just a bedtime dose or two and see how you tolerate it before trying in daytime   Take delsym two tsp every 12 hours and supplement if needed with  tramadol 50 mg up to 2 every 4 hours to suppress the urge to cough. Swallowing water or using ice chips/non mint and menthol containing candies (such as lifesavers or sugarless jolly ranchers) are also effective.  You should rest your voice and avoid activities that you know make you cough. Once you have eliminated the cough for 3 straight days try reducing the tramadol first,  then the delsym as tolerated.   Prednisone 10 mg take  4 each am x 2 days,   2 each am x 2 days,  1 each am x 2 days and stop  If not better return after the holidays to regroup    01/09/2016 acute extended ov/Wert re: recurrent cough   Chief Complaint  Patient presents with  . Acute Visit    Pt c/o cough and nasal congestion for the past 3 wks. He states his cough is prod mostly in the am's with green to yellow sputum.  He also c/o sinus pressure and HA.      Had completely eliminated cough on above rx but found aciphex too expensive so d/c'd and did fine for months until "caught a cold" and downhill since then, did not resume ppi otc, just taking h2 and no better  Hasn't used any inhalers in months  No obvious day to day or daytime variability or assoc sob or cp or chest tightness, subjective wheeze  . No unusual exp hx or h/o childhood pna/ asthma or knowledge of premature birth.  Sleeping ok without nocturnal  or early am exacerbation  of respiratory  c/o's or need for noct saba. Also denies any obvious fluctuation of symptoms with weather or environmental changes or other aggravating or alleviating factors except as outlined above   Current Medications, Allergies, Complete Past Medical History, Past Surgical History, Family History, and Social History were reviewed in [employer]  record.  ROS  The following are not active complaints unless bolded sore throat, dysphagia, dental problems, itching, sneezing,  nasal congestion or excess/ purulent secretions, ear ache,   fever, chills, sweats, unintended wt loss, classically pleuritic or exertional cp, hemoptysis,  orthopnea pnd or leg swelling, presyncope, palpitations, abdominal pain, anorexia, nausea, vomiting, diarrhea  or change in bowel or bladder habits, change in stools or urine, dysuria,hematuria,  rash, arthralgias, visual complaints, headache, numbness, weakness or ataxia or problems with walking or coordination,  change in mood/affect or memory.  Objective:   Physical Exam  amb wm nad /  Vital signs reviewed  - Note on arrival 02 sats  98% on RA      Wt 179 05/08/2010 >>184 10/21/2010 > 11/04/2010 182 > 01/17/2015 192 > 01/09/2016  177   HEENT: nl dentition,  and orophanx. Nl external ear canals without cough reflex. Mild nonspecific turbinate edema   NECK :  without JVD/Nodes/TM/ nl carotid upstrokes bilaterally   LUNGS:  Very minimal insp and exp rhonchi mostly upper airway    CV:  RRR  no s3 or murmur or increase in P2, no edema   ABD:  soft and nontender with nl excursion in the supine position. No bruits or organomegaly, bowel sounds nl  MS:  warm without deformities, calf tenderness, cyanosis or clubbing       Assessment & Plan:

## 2016-01-10 ENCOUNTER — Encounter: Payer: Self-pay | Admitting: Internal Medicine

## 2016-01-10 NOTE — Assessment & Plan Note (Addendum)
-   Sinus CT 04/11/10 Negative paranasal sinuses   - Allergy eval 11/04/2010 >>>  Neg eos >>>  IgE  49.7 with pos screen for molds > dog dander   - NO 01/17/2015  17  - 01/17/2015  extensive coaching HFA effectiveness =    75% from a baseline of < 25%   - recurred 11/2015 in setting of uri/?sinusitis off gerd rx . rec resume short term ppi/  Of the three most common causes of chronic cough, only one (GERD)  can actually cause the other two (asthma and post nasal drip syndrome)  and perpetuate the cylce of cough inducing airway trauma, inflammation, heightened sensitivity to reflux which is prompted by the cough itself via a cyclical mechanism.    This may partially respond to steroids and look like asthma and post nasal drainage but never erradicated completely unless the cough and the secondary reflux are eliminated, preferably both at the same time.  While not intuitively obvious, many patients with chronic low grade reflux do not cough until there is a secondary insult that disturbs the protective epithelial barrier and exposes sensitive nerve endings.  This can be viral or direct physical injury such as with an endotracheal tube.   The point is that once this occurs, it is difficult to eliminate using anything but a maximally effective acid suppression regimen at least in the short run, accompanied by an appropriate diet to address non acid GERD.   rec rx with augmentin / resume max gerd rx/ hold off on any further bronchodilators/ics as I don't think asthma is the primary problem driving the symptom, though he is at risk  I had an extended discussion with the patient reviewing all relevant studies completed to date and  lasting 15 to 20 minutes of a 25 minute visit    Each maintenance medication was reviewed in detail including most importantly the difference between maintenance and prns and under what circumstances the prns are to be triggered using an action plan format that is not reflected in  the computer generated alphabetically organized AVS.    Please see AVS for unique instructions that I personally wrote and verbalized to the the pt in detail and then reviewed with pt  by my nurse highlighting any  changes in therapy recommended at today's visit to their plan of care.

## 2016-01-29 ENCOUNTER — Other Ambulatory Visit: Payer: Self-pay | Admitting: Family Medicine

## 2016-05-19 ENCOUNTER — Other Ambulatory Visit: Payer: Self-pay | Admitting: Internal Medicine

## 2016-05-20 DIAGNOSIS — H33311 Horseshoe tear of retina without detachment, right eye: Secondary | ICD-10-CM | POA: Diagnosis not present

## 2016-05-20 DIAGNOSIS — H2512 Age-related nuclear cataract, left eye: Secondary | ICD-10-CM | POA: Diagnosis not present

## 2016-05-20 DIAGNOSIS — H25811 Combined forms of age-related cataract, right eye: Secondary | ICD-10-CM | POA: Diagnosis not present

## 2016-07-14 ENCOUNTER — Other Ambulatory Visit: Payer: Self-pay | Admitting: Internal Medicine

## 2016-07-29 DIAGNOSIS — H25013 Cortical age-related cataract, bilateral: Secondary | ICD-10-CM | POA: Diagnosis not present

## 2016-07-29 DIAGNOSIS — H2511 Age-related nuclear cataract, right eye: Secondary | ICD-10-CM | POA: Diagnosis not present

## 2016-07-29 DIAGNOSIS — H35371 Puckering of macula, right eye: Secondary | ICD-10-CM | POA: Diagnosis not present

## 2016-07-29 DIAGNOSIS — H2513 Age-related nuclear cataract, bilateral: Secondary | ICD-10-CM | POA: Diagnosis not present

## 2016-07-29 DIAGNOSIS — H18413 Arcus senilis, bilateral: Secondary | ICD-10-CM | POA: Diagnosis not present

## 2016-07-29 DIAGNOSIS — H02839 Dermatochalasis of unspecified eye, unspecified eyelid: Secondary | ICD-10-CM | POA: Diagnosis not present

## 2016-08-20 DIAGNOSIS — H35371 Puckering of macula, right eye: Secondary | ICD-10-CM | POA: Diagnosis not present

## 2016-08-20 DIAGNOSIS — H2513 Age-related nuclear cataract, bilateral: Secondary | ICD-10-CM | POA: Diagnosis not present

## 2016-08-20 DIAGNOSIS — H43813 Vitreous degeneration, bilateral: Secondary | ICD-10-CM | POA: Diagnosis not present

## 2016-10-17 ENCOUNTER — Other Ambulatory Visit: Payer: Self-pay | Admitting: Internal Medicine

## 2016-10-17 NOTE — Telephone Encounter (Signed)
Rx sent 

## 2016-10-17 NOTE — Telephone Encounter (Signed)
Pt requesting refill on rabeprazole (Aciphex) 20mg . No longer on med list. Please advise.

## 2016-10-17 NOTE — Telephone Encounter (Signed)
History of GERD, okay to refill 6 months. Advice patient if severe symptoms needs to be seen

## 2016-10-20 DIAGNOSIS — H2511 Age-related nuclear cataract, right eye: Secondary | ICD-10-CM | POA: Diagnosis not present

## 2016-10-21 DIAGNOSIS — H2512 Age-related nuclear cataract, left eye: Secondary | ICD-10-CM | POA: Diagnosis not present

## 2016-10-22 DIAGNOSIS — L570 Actinic keratosis: Secondary | ICD-10-CM | POA: Diagnosis not present

## 2016-10-22 DIAGNOSIS — L821 Other seborrheic keratosis: Secondary | ICD-10-CM | POA: Diagnosis not present

## 2016-10-22 DIAGNOSIS — D225 Melanocytic nevi of trunk: Secondary | ICD-10-CM | POA: Diagnosis not present

## 2016-10-23 ENCOUNTER — Telehealth: Payer: Self-pay | Admitting: Internal Medicine

## 2016-10-23 NOTE — Telephone Encounter (Signed)
[facility] [phone] says rabeprazole IS NOT covered by pt ins. Can [facility] approve Omeprazole or Pantoprozole instead?  Ref#  [id number].

## 2016-10-24 NOTE — Telephone Encounter (Signed)
PA initiated via Covermymeds; KEY: [id number]. Received PA approval.   CaseId:[id number];Status:Approved;Review Type:Prior Auth;Coverage Start Date:09/24/2016;Coverage End Date:10/24/2017

## 2016-11-10 DIAGNOSIS — H2512 Age-related nuclear cataract, left eye: Secondary | ICD-10-CM | POA: Diagnosis not present

## 2016-11-12 ENCOUNTER — Ambulatory Visit (INDEPENDENT_AMBULATORY_CARE_PROVIDER_SITE_OTHER): Payer: Medicare Other | Admitting: Internal Medicine

## 2016-11-12 ENCOUNTER — Other Ambulatory Visit: Payer: Self-pay

## 2016-11-12 ENCOUNTER — Encounter: Payer: Self-pay | Admitting: Internal Medicine

## 2016-11-12 VITALS — BP 116/70 | HR 76 | Temp 97.6°F | Resp 14 | Ht 66.0 in | Wt 172.5 lb

## 2016-11-12 DIAGNOSIS — Z0000 Encounter for general adult medical examination without abnormal findings: Secondary | ICD-10-CM

## 2016-11-12 DIAGNOSIS — R399 Unspecified symptoms and signs involving the genitourinary system: Secondary | ICD-10-CM

## 2016-11-12 DIAGNOSIS — K219 Gastro-esophageal reflux disease without esophagitis: Secondary | ICD-10-CM

## 2016-11-12 DIAGNOSIS — Z23 Encounter for immunization: Secondary | ICD-10-CM | POA: Diagnosis not present

## 2016-11-12 DIAGNOSIS — E785 Hyperlipidemia, unspecified: Secondary | ICD-10-CM | POA: Diagnosis not present

## 2016-11-12 DIAGNOSIS — R972 Elevated prostate specific antigen [PSA]: Secondary | ICD-10-CM

## 2016-11-12 NOTE — Progress Notes (Signed)
Subjective:    Patient ID: Rick Campbell, male    DOB: [birth year], 71 y.o.   MRN: [id number]  DOS:  11/12/2016 Type of visit - description : rov Interval history: dislipidemia: On Lipitor, due for labs BPH L UTS: Symptoms controlled with Flomax. GERD: Symptoms controlled on AcipHex and H2 blockers Had cataract surgery, one of the anesthesiologist told pt he has a mild murmur.   Review of Systems  Denies chest pain or difficulty breathing No nausea, vomiting or diarrhea No cough or sputum production No dysuria or gross hematuria  Past Medical History:  Diagnosis Date  . Adenomatous polyp of colon   . Allergy    seasonal  . Chronic headache   . Esophageal reflux   . Hyperlipidemia   . Lower urinary tract symptoms (LUTS)     Past Surgical History:  Procedure Laterality Date  . CHOLECYSTECTOMY  1998    Social History   Social History  . Marital status: Married    Spouse name: N/A  . Number of children: 2  . Years of education: N/A   Occupational History  . retiredAdvice worker   Social History Main Topics  . Smoking status: Never Smoker  . Smokeless tobacco: Never Used  . Alcohol use Yes     Comment: 1 or 2 glasses of wine every week  . Drug use: No  . Sexual activity: Not on file   Other Topics Concern  . Not on file   Social History Narrative   retired from [employer]  , lives w/ wife , both children in [place] as of 11/12/2016      Reactions   Sulfonamide Derivatives Hives, Swelling   Urticaria      Medication List       Accurate as of 11/12/16 11:59 PM. Always use your most recent med list.          aspirin 81 MG tablet Take 81 mg by mouth daily.   atorvastatin 10 MG tablet Commonly known as:  LIPITOR Take 1 tablet (10 mg total) by mouth daily at 6 PM.   famotidine 20 MG tablet Commonly known as:  PEPCID Take 20 mg by mouth at bedtime.   fluticasone 50 MCG/ACT nasal spray Commonly known as:   FLONASE Place 1 spray into both nostrils daily. Reported on 06/29/2015   RABEprazole 20 MG tablet Commonly known as:  ACIPHEX Take 1 tablet (20 mg total) by mouth daily.   SENIOR MULTIVITAMIN PLUS PO Take 1 tablet by mouth daily.   tamsulosin 0.4 MG Caps capsule Commonly known as:  FLOMAX Take 1 capsule (0.4 mg total) by mouth daily after supper.          Objective:   Physical Exam BP 116/70 (BP Location: Left Arm, Patient Position: Sitting, Cuff Size: Small)   Pulse 76   Temp 97.6 F (36.4 C) (Oral)   Resp 14   Ht 5\' 6"  (1.676 m)   Wt 172 lb 8 oz (78.2 kg)   SpO2 97%   BMI 27.84 kg/m   General:   Well developed, well nourished . NAD.  Neck: No  thyromegaly  HEENT:  Normocephalic . Face symmetric, atraumatic Lungs:  CTA B Normal respiratory effort, no intercostal retractions, no accessory muscle use. Heart: RRR,  no murmur.  No pretibial edema bilaterally  Abdomen:  Not distended, soft, non-tender. No rebound or  rigidity.   Skin: Exposed areas without rash. Not pale. Not jaundice Rectal:  External abnormalities: none. Normal sphincter tone. No rectal masses or tenderness.  No stools found Prostate: Prostate gland firm and smooth, no enlargement, nodularity, tenderness, mass, asymmetry or induration.   Neurologic:  alert & oriented X3.  Speech normal, gait appropriate for age and unassisted Strength symmetric and appropriate for age.  Psych: Cognition and judgment appear intact.  Cooperative with normal attention span and concentration.  Behavior appropriate. No anxious or depressed appearing.    Assessment & Plan:t       Assessment Hyperlipidemia GERD Seasonal allergies Obesity H/o increased PSA x1, LUTS Headaches, chronic  PLAN: Primary care issues reviewed High cholesterol: Continue Lipitor, check a CMP, FLP, CBC GERD: Well controlled with AcipHex every other day and famotidine daily L UTS: Symptoms controlled with Flomax every other  day. Increased PSA: See comments under physical exam Heart murmur? I don't hear a murmur today. Cough: not an issue at this time RTC one year

## 2016-11-12 NOTE — Patient Instructions (Signed)
GO TO THE LAB : Get the blood work     GO TO THE FRONT DESK Schedule your next appointment for a   routine checkup in 1 year  Get a Medicare wellness with one of our nurses at your convenience

## 2016-11-12 NOTE — Progress Notes (Signed)
Pre visit review using our clinic review tool, if applicable. No additional management support is needed unless otherwise documented below in the visit note. 

## 2016-11-12 NOTE — Assessment & Plan Note (Addendum)
-  Td 2015, had a zostavax; Pneumonia shot 2012; prevnar 2015. Flu shot today. shingrix discussed -CCS:  Colonoscopy:  , 06/27/2005 , 09-2010---->  1 polyp, cscope 11-2015, next per GI  -Prostate cancer screening:  PSA was slt elevated before, DRE WNL today, check a PSA  - Labs: PSA, CMP, FLP, CBC

## 2016-11-13 LAB — COMPREHENSIVE METABOLIC PANEL
ALBUMIN: 4.3 g/dL (ref 3.5–5.2)
ALT: 29 U/L (ref 0–53)
AST: 22 U/L (ref 0–37)
Alkaline Phosphatase: 55 U/L (ref 39–117)
BILIRUBIN TOTAL: 0.8 mg/dL (ref 0.2–1.2)
BUN: 9 mg/dL (ref 6–23)
CALCIUM: 9.7 mg/dL (ref 8.4–10.5)
CHLORIDE: 103 meq/L (ref 96–112)
CO2: 31 meq/L (ref 19–32)
CREATININE: 0.9 mg/dL (ref 0.40–1.50)
GFR: 88.29 mL/min (ref >60.00)
Glucose, Bld: 89 mg/dL (ref 70–99)
Potassium: 4 mEq/L (ref 3.5–5.1)
Sodium: 142 mEq/L (ref 135–145)
Total Protein: 6.9 g/dL (ref 6.0–8.3)

## 2016-11-13 LAB — CBC WITH DIFFERENTIAL/PLATELET
BASOS PCT: 0.8 % (ref 0.0–3.0)
Basophils Absolute: 0 10*3/uL (ref 0.0–0.1)
Eosinophils Absolute: 0 10*3/uL (ref 0.0–0.7)
Eosinophils Relative: 0.6 % (ref 0.0–5.0)
HEMATOCRIT: 47.9 % (ref 39.0–52.0)
HEMOGLOBIN: 16.2 g/dL (ref 13.0–17.0)
LYMPHS PCT: 27.5 % (ref 12.0–46.0)
Lymphs Abs: 1.7 10*3/uL (ref 0.7–4.0)
MCHC: 33.8 g/dL (ref 30.0–36.0)
MCV: 94.6 fl (ref 78.0–100.0)
MONOS PCT: 7.4 % (ref 3.0–12.0)
Monocytes Absolute: 0.5 10*3/uL (ref 0.1–1.0)
Neutro Abs: 4 10*3/uL (ref 1.4–7.7)
Neutrophils Relative %: 63.7 % (ref 43.0–77.0)
Platelets: 203 10*3/uL (ref 150.0–400.0)
RBC: 5.07 Mil/uL (ref 4.22–5.81)
RDW: 13 % (ref 11.5–15.5)
WBC: 6.3 10*3/uL (ref 4.0–10.5)

## 2016-11-13 LAB — LIPID PANEL
CHOLESTEROL: 147 mg/dL (ref 0–200)
HDL: 50.3 mg/dL (ref >39.00)
LDL Cholesterol: 85 mg/dL (ref 0–99)
NonHDL: 96.39
TRIGLYCERIDES: 58 mg/dL (ref 0.0–149.0)
Total CHOL/HDL Ratio: 3
VLDL: 11.6 mg/dL (ref 0.0–40.0)

## 2016-11-13 LAB — PSA: PSA: 8.41 ng/mL — ABNORMAL HIGH (ref 0.10–4.00)

## 2016-11-13 NOTE — Assessment & Plan Note (Signed)
Primary care issues reviewed High cholesterol: Continue Lipitor, check a CMP, FLP, CBC GERD: Well controlled with AcipHex every other day and famotidine daily L UTS: Symptoms controlled with Flomax every other day. Increased PSA: See comments under physical exam Heart murmur? I don't hear a murmur today. Cough: not an issue at this time RTC one year

## 2016-11-14 ENCOUNTER — Encounter: Payer: Self-pay | Admitting: Internal Medicine

## 2016-11-14 NOTE — Addendum Note (Signed)
Addended by: Damita Dunnings D on: 11/14/2016 09:41 AM   Modules accepted: Orders

## 2016-11-17 ENCOUNTER — Encounter: Payer: Self-pay | Admitting: Internal Medicine

## 2016-12-26 ENCOUNTER — Other Ambulatory Visit: Payer: Self-pay | Admitting: Internal Medicine

## 2016-12-26 NOTE — Telephone Encounter (Signed)
Rx sent 

## 2016-12-26 NOTE — Telephone Encounter (Signed)
Okay to refill x6, uses as needed

## 2016-12-26 NOTE — Telephone Encounter (Signed)
Pt requesting refill on Astelin nasal spray. No longer on med list. Please advise.

## 2016-12-29 DIAGNOSIS — R972 Elevated prostate specific antigen [PSA]: Secondary | ICD-10-CM | POA: Diagnosis not present

## 2016-12-29 DIAGNOSIS — R3915 Urgency of urination: Secondary | ICD-10-CM | POA: Diagnosis not present

## 2016-12-29 DIAGNOSIS — N401 Enlarged prostate with lower urinary tract symptoms: Secondary | ICD-10-CM | POA: Diagnosis not present

## 2016-12-29 LAB — PSA: PSA: 4.4

## 2017-01-11 ENCOUNTER — Other Ambulatory Visit: Payer: Self-pay | Admitting: Internal Medicine

## 2017-02-06 ENCOUNTER — Encounter: Payer: Self-pay | Admitting: Internal Medicine

## 2017-03-09 ENCOUNTER — Other Ambulatory Visit: Payer: Self-pay | Admitting: Internal Medicine

## 2017-03-30 DIAGNOSIS — R972 Elevated prostate specific antigen [PSA]: Secondary | ICD-10-CM | POA: Diagnosis not present

## 2017-03-30 LAB — PSA: PSA: 4.17

## 2017-05-27 ENCOUNTER — Encounter: Payer: Self-pay | Admitting: Internal Medicine

## 2017-05-27 ENCOUNTER — Ambulatory Visit (INDEPENDENT_AMBULATORY_CARE_PROVIDER_SITE_OTHER): Payer: Medicare Other | Admitting: Internal Medicine

## 2017-05-27 VITALS — BP 116/76 | HR 73 | Temp 98.3°F | Resp 14 | Ht 66.0 in | Wt 178.5 lb

## 2017-05-27 DIAGNOSIS — M26629 Arthralgia of temporomandibular joint, unspecified side: Secondary | ICD-10-CM

## 2017-05-27 NOTE — Patient Instructions (Signed)
Temporomandibular Joint Syndrome Temporomandibular joint (TMJ) syndrome is a condition that affects the joints between your jaw and your skull. The TMJs are located near your ears and allow your jaw to open and close. These joints and the nearby muscles are involved in all movements of the jaw. People with TMJ syndrome have pain in the area of these joints and muscles. Chewing, biting, or other movements of the jaw can be difficult or painful. TMJ syndrome can be caused by various things. In many cases, the condition is mild and goes away within a few weeks. For some people, the condition can become a long-term problem. What are the causes? Possible causes of TMJ syndrome include:  Grinding your teeth or clenching your jaw. Some people do this when they are under stress.  Arthritis.  Injury to the jaw.  Head or neck injury.  Teeth or dentures that are not aligned well.  In some cases, the cause of TMJ syndrome may not be known. What are the signs or symptoms? The most common symptom is an aching pain on the side of the head in the area of the TMJ. Other symptoms may include:  Pain when moving your jaw, such as when chewing or biting.  Being unable to open your jaw all the way.  Making a clicking sound when you open your mouth.  Headache.  Earache.  Neck or shoulder pain.  How is this diagnosed? Diagnosis can usually be made based on your symptoms, your medical history, and a physical exam. Your health care provider may check the range of motion of your jaw. Imaging tests, such as X-rays or an MRI, are sometimes done. You may need to see your dentist to determine if your teeth and jaw are lined up correctly. How is this treated? TMJ syndrome often goes away on its own. If treatment is needed, the options may include:  Eating soft foods and applying ice or heat.  Medicines to relieve pain or inflammation.  Medicines to relax the muscles.  A splint, bite plate, or mouthpiece  to prevent teeth grinding or jaw clenching.  Relaxation techniques or counseling to help reduce stress.  Transcutaneous electrical nerve stimulation (TENS). This helps to relieve pain by applying an electrical current through the skin.  Acupuncture. This is sometimes helpful to relieve pain.  Jaw surgery. This is rarely needed.  Follow these instructions at home:  Take medicines only as directed by your health care provider.  Eat a soft diet if you are having trouble chewing.  Apply ice to the painful area. ? Put ice in a plastic bag. ? Place a towel between your skin and the bag. ? Leave the ice on for 20 minutes, 2-3 times a day.  Apply a warm compress to the painful area as directed.  Massage your jaw area and perform any jaw stretching exercises as recommended by your health care provider.  If you were given a mouthpiece or bite plate, wear it as directed.  Avoid foods that require a lot of chewing. Do not chew gum.  Keep all follow-up visits as directed by your health care provider. This is important. Contact a health care provider if:  You are having trouble eating.  You have new or worsening symptoms. Get help right away if:  Your jaw locks open or closed. This information is not intended to replace advice given to you by your health care provider. Make sure you discuss any questions you have with your health care provider. Document   Released: 10/08/2000 Document Revised: 09/13/2015 Document Reviewed: 08/18/2013 Elsevier Interactive Patient Education  2018 [employer]  

## 2017-05-27 NOTE — Progress Notes (Signed)
Pre visit review using our clinic review tool, if applicable. No additional management support is needed unless otherwise documented below in the visit note. 

## 2017-05-27 NOTE — Progress Notes (Signed)
Subjective:    Patient ID: Rick Campbell, male    DOB: 30-Aug-1945, 72 y.o.   MRN: [id number]  DOS:  05/27/2017 Type of visit - description : Acute visit Interval history: 2 to 3 weeks history of "right ear pain". The patient points to the R trago and the mastoids area. Symptoms are worse when he takes big bites for instance biting  an apple. Also when he yaw  Review of Systems No recent dental pain or dental work No recent URI symptoms, no ear discharge.  Hearing is okay. Denies runny nose or sore throat No fever, chills, weight loss or headache Past Medical History:  Diagnosis Date  . Adenomatous polyp of colon   . Allergy    seasonal  . Chronic headache   . Esophageal reflux   . Hyperlipidemia   . Lower urinary tract symptoms (LUTS)     Past Surgical History:  Procedure Laterality Date  . CHOLECYSTECTOMY  1998    Social History   Socioeconomic History  . Marital status: Married    Spouse name: Not on file  . Number of children: 2  . Years of education: Not on file  . Highest education level: Not on file  Occupational History  . Occupation: retired- Education administrator    Comment: Presenter, broadcasting Needs  . Financial resource strain: Not on file  . Food insecurity:    Worry: Not on file    Inability: Not on file  . Transportation needs:    Medical: Not on file    Non-medical: Not on file  Tobacco Use  . Smoking status: Never Smoker  . Smokeless tobacco: Never Used  Substance and Sexual Activity  . Alcohol use: Yes    Comment: 1 or 2 glasses of wine every week  . Drug use: No  . Sexual activity: Not on file  Lifestyle  . Physical activity:    Days per week: Not on file    Minutes per session: Not on file  . Stress: Not on file  Relationships  . Social connections:    Talks on phone: Not on file    Gets together: Not on file    Attends religious service: Not on file    Active member of club or organization: Not on file    Attends meetings of clubs or  organizations: Not on file    Relationship status: Not on file  . Intimate partner violence:    Fear of current or ex partner: Not on file    Emotionally abused: Not on file    Physically abused: Not on file    Forced sexual activity: Not on file  Other Topics Concern  . Not on file  Social History Narrative   retired from [employer]  , lives w/ wife , both children in [place] as of 05/27/2017      Reactions   Sulfonamide Derivatives Hives, Swelling   Urticaria      Medication List        Accurate as of 05/27/17 11:59 PM. Always use your most recent med list.          aspirin 81 MG tablet Take 81 mg by mouth daily.   atorvastatin 10 MG tablet Commonly known as:  LIPITOR Take 1 tablet (10 mg total) by mouth daily at 6 PM.   azelastine 0.1 % nasal spray Commonly known as:  ASTELIN Place 2 sprays into both  nostrils at bedtime as needed for rhinitis or allergies.   famotidine 20 MG tablet Commonly known as:  PEPCID Take 20 mg by mouth at bedtime.   fluticasone 50 MCG/ACT nasal spray Commonly known as:  FLONASE Place 1 spray into both nostrils daily. Reported on 06/29/2015   RABEprazole 20 MG tablet Commonly known as:  ACIPHEX Take 1 tablet (20 mg total) by mouth daily.   SENIOR MULTIVITAMIN PLUS PO Take 1 tablet by mouth daily.   tamsulosin 0.4 MG Caps capsule Commonly known as:  FLOMAX Take 1 capsule (0.4 mg total) by mouth daily after supper.          Objective:   Physical Exam BP 116/76 (BP Location: Right Arm, Patient Position: Sitting, Cuff Size: Small)   Pulse 73   Temp 98.3 F (36.8 C) (Oral)   Resp 14   Ht 5\' 6"  (1.676 m)   Wt 178 lb 8 oz (81 kg)   SpO2 96%   BMI 28.81 kg/m  General:   Well developed, well nourished . NAD.  HEENT:  Normocephalic . Face symmetric, atraumatic. TMs normal Temples: No TTP, normal TA pulse. TMJs: Mild click bilateral.  No TTP. Oral examination: Throat symmetric, no red, teeth in good  condition Neck: has few, small LADs in both sides Skin: Not pale. Not jaundice Neurologic:  alert & oriented X3.  Speech normal, gait appropriate for age and unassisted Psych--  Cognition and judgment appear intact.  Cooperative with normal attention span and concentration.  Behavior appropriate. No anxious or depressed appearing.      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 Recommend no heavy chewing, soft foods, ice pack, Tylenol as needed, if not better call the dentist or call me for further eval/referral.

## 2017-05-28 NOTE — Assessment & Plan Note (Signed)
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 Recommend no heavy chewing, soft foods, ice pack, Tylenol as needed, if not better call the dentist or call me for further eval/referral.

## 2017-06-16 DIAGNOSIS — H59031 Cystoid macular edema following cataract surgery, right eye: Secondary | ICD-10-CM | POA: Diagnosis not present

## 2017-06-16 DIAGNOSIS — H33311 Horseshoe tear of retina without detachment, right eye: Secondary | ICD-10-CM | POA: Diagnosis not present

## 2017-06-16 DIAGNOSIS — Z961 Presence of intraocular lens: Secondary | ICD-10-CM | POA: Diagnosis not present

## 2017-06-25 NOTE — Progress Notes (Addendum)
Subjective:   Rick Campbell is a 72 y.o. male who presents for Medicare Annual/Subsequent preventive examination.  Review of Systems: No ROS.  Medicare Wellness Visit. Additional risk factors are reflected in the social history. Cardiac Risk Factors include: advanced age (>70men, >33 women);dyslipidemia Sleep patterns:  Only sleeps 4-5 hrs. Feels rested. Home Safety/Smoke Alarms: Feels safe in home. Smoke alarms in place.  Living environment; residence and Firearm Safety: 2 story home with wife.  Eye:  Reading glasses. Dr. Clydene Laming yearly.  Male:   CCS-  Due 11/2021 PSA-  Lab Results  Component Value Date   PSA 8.41 (H) 11/12/2016   PSA 4.00 11/14/2015   PSA 4.45 (H) 06/29/2015       Objective:    Vitals: BP 118/70 (BP Location: Left Arm, Patient Position: Sitting, Cuff Size: Normal)   Pulse 84   Ht 5\' 6"  (1.676 m)   Wt 180 lb 3.2 oz (81.7 kg)   SpO2 96%   BMI 29.09 kg/m   Body mass index is 29.09 kg/m.  Advanced Directives 06/30/2017  Does Patient Have a Medical Advance Directive? Yes  Type of Paramedic of [place];Living will  Copy of [place] in Chart? No - copy requested    Tobacco Social History   Tobacco Use  Smoking Status Never Smoker  Smokeless Tobacco Never Used     Counseling given: Not Answered   Clinical Intake: Pain : No/denies pain     Past Medical History:  Diagnosis Date  . Adenomatous polyp of colon   . Allergy    seasonal  . Chronic headache   . Esophageal reflux   . Hyperlipidemia   . Lower urinary tract symptoms (LUTS)    Past Surgical History:  Procedure Laterality Date  . CHOLECYSTECTOMY  1998  . EYE SURGERY Bilateral 11/27/2017   cataract sx   Family History  Problem Relation Age of Onset  . Colon cancer Father        dx age 80s  . Diabetes Father   . Heart Problems Father        father CHF in his 72s  . Lung cancer Mother        was a heavy smoker  . COPD Neg Hx   .  Arthritis Neg Hx   . Prostate cancer Neg Hx    Social History   Socioeconomic History  . Marital status: Married    Spouse name: Not on file  . Number of children: 2  . Years of education: Not on file  . Highest education level: Not on file  Occupational History  . Occupation: retired- Education administrator    Comment: Presenter, broadcasting Needs  . Financial resource strain: Not on file  . Food insecurity:    Worry: Not on file    Inability: Not on file  . Transportation needs:    Medical: Not on file    Non-medical: Not on file  Tobacco Use  . Smoking status: Never Smoker  . Smokeless tobacco: Never Used  Substance and Sexual Activity  . Alcohol use: Yes    Comment: 1 or 2 glasses of wine every week  . Drug use: No  . Sexual activity: Yes  Lifestyle  . Physical activity:    Days per week: Not on file    Minutes per session: Not on file  . Stress: Not on file  Relationships  . Social connections:    Talks on phone: Not on file  Gets together: Not on file    Attends religious service: Not on file    Active member of club or organization: Not on file    Attends meetings of clubs or organizations: Not on file    Relationship status: Not on file  Other Topics Concern  . Not on file  Social History Narrative   retired from [employer]  , lives w/ wife , both children in [place]          Outpatient Encounter Medications as of 06/30/2017  Medication Sig  . aspirin 81 MG tablet Take 81 mg by mouth daily.    Marland Kitchen atorvastatin (LIPITOR) 10 MG tablet Take 1 tablet (10 mg total) by mouth daily at 6 PM.  . famotidine (PEPCID) 20 MG tablet Take 20 mg by mouth at bedtime.  . Multiple Vitamins-Minerals (SENIOR MULTIVITAMIN PLUS PO) Take 1 tablet by mouth daily.    . RABEprazole (ACIPHEX) 20 MG tablet Take 1 tablet (20 mg total) by mouth daily. (Patient taking differently: Take 20 mg by mouth every other day. )  . tamsulosin (FLOMAX) 0.4 MG CAPS capsule Take 1 capsule (0.4 mg total) by mouth daily  after supper.  Marland Kitchen azelastine (ASTELIN) 0.1 % nasal spray Place 2 sprays into both nostrils at bedtime as needed for rhinitis or allergies. (Patient not taking: Reported on 06/30/2017)  . fluticasone (FLONASE) 50 MCG/ACT nasal spray Place 1 spray into both nostrils daily. Reported on 06/29/2015   Facility-Administered Encounter Medications as of 06/30/2017  Medication  . 0.9 %  sodium chloride infusion    Activities of Daily Living In your present state of health, do you have any difficulty performing the following activities: 06/30/2017  Hearing? N  Vision? N  Difficulty concentrating or making decisions? N  Walking or climbing stairs? N  Dressing or bathing? N  Doing errands, shopping? N  Preparing Food and eating ? N  Using the Toilet? N  In the past six months, have you accidently leaked urine? N  Do you have problems with loss of bowel control? N  Managing your Medications? N  Managing your Finances? N  Housekeeping or managing your Housekeeping? N  Some recent data might be hidden    Patient Care Team: Colon Branch, MD as PCP - General Jarome Matin, MD as Consulting Physician (Dermatology) Tanda Rockers, MD as Consulting Physician (Pulmonary Disease) Darleen Crocker, MD as Consulting Physician (Ophthalmology) Festus Aloe, MD as Consulting Physician (Urology)   Assessment:   This is a routine wellness examination for Rick Campbell. Physical assessment deferred to PCP.  Exercise Activities and Dietary recommendations Current Exercise Habits: Structured exercise class, Type of exercise: strength training/weights;walking, Frequency (Times/Week): 5, Intensity: Mild, Exercise limited by: None identified Diet (meal preparation, eat out, water intake, caffeinated beverages, dairy products, fruits and vegetables): in general, a "healthy" diet  , well balanced, on average, 3 meals per day     Goals    None      Fall Risk Fall Risk  06/30/2017 11/14/2015 06/29/2015 10/04/2014 08/10/2013  Falls  in the past year? No No No No No    Depression Screen PHQ 2/9 Scores 06/30/2017 11/14/2015 06/29/2015 10/04/2014  PHQ - 2 Score 0 0 0 0    Cognitive Function   Ad8 score reviewed for issues:  Issues making decisions:no  Less interest in hobbies / activities:no  Repeats questions, stories (family complaining):no  Trouble using ordinary gadgets (microwave, computer, phone):no  Forgets the month or year: no  Mismanaging finances:  no  Remembering appts:no  Daily problems with thinking and/or memory:no Ad8 score is=0      Immunization History  Administered Date(s) Administered  . Influenza Split 11/26/2010  . Influenza, High Dose Seasonal PF 11/07/2014, 11/14/2015, 11/12/2016  . Influenza,inj,Quad PF,6+ Mos 11/22/2013  . Influenza-Unspecified 11/27/2012  . Pneumococcal Conjugate-13 08/10/2013  . Pneumococcal Polysaccharide-23 06/21/2010  . Td 09/15/2003  . Tetanus 08/10/2013  . Zoster 09/10/2009    Screening Tests Health Maintenance  Topic Date Due  . INFLUENZA VACCINE  08/27/2017  . COLONOSCOPY  11/27/2020  . TETANUS/TDAP  08/11/2023  . Hepatitis C Screening  Completed  . PNA vac Low Risk Adult  Completed      Plan:     Please schedule your next medicare wellness visit with me in 1 yr.  Continue to eat heart healthy diet (full of fruits, vegetables, whole grains, lean protein, water--limit salt, fat, and sugar intake) and increase physical activity as tolerated.  Continue doing brain stimulating activities   Bring a copy of your living will and/or healthcare power of attorney to your next office visit.   I have personally reviewed and noted the following in the patient's chart:   . Medical and social history . Use of alcohol, tobacco or illicit drugs  . Current medications and supplements . Functional ability and status . Nutritional status . Physical activity . Advanced directives . List of other physicians . Hospitalizations, surgeries, and ER visits  in previous 12 months . Vitals . Screenings to include cognitive, depression, and falls . Referrals and appointments  In addition, I have reviewed and discussed with patient certain preventive protocols, quality metrics, and best practice recommendations. A written personalized care plan for preventive services as well as general preventive health recommendations were provided to patient.     Naaman Plummer [place], [place]  06/30/2017  Kathlene November, MD

## 2017-06-30 ENCOUNTER — Ambulatory Visit (INDEPENDENT_AMBULATORY_CARE_PROVIDER_SITE_OTHER): Payer: Medicare Other | Admitting: *Deleted

## 2017-06-30 ENCOUNTER — Encounter: Payer: Self-pay | Admitting: *Deleted

## 2017-06-30 VITALS — BP 118/70 | HR 84 | Ht 66.0 in | Wt 180.2 lb

## 2017-06-30 DIAGNOSIS — Z0000 Encounter for general adult medical examination without abnormal findings: Secondary | ICD-10-CM

## 2017-06-30 NOTE — Patient Instructions (Addendum)
Please schedule your next medicare wellness visit with me in 1 yr.  Continue to eat heart healthy diet (full of fruits, vegetables, whole grains, lean protein, water--limit salt, fat, and sugar intake) and increase physical activity as tolerated.  Continue doing brain stimulating activities   Bring a copy of your living will and/or healthcare power of attorney to your next office visit.   Rick Campbell , Thank you for taking time to come for your Medicare Wellness Visit. I appreciate your ongoing commitment to your health goals. Please review the following plan we discussed and let me know if I can assist you in the future.   These are the goals we discussed: Goals    . maintain current healthy lifestyle       This is a list of the screening recommended for you and due dates:  Health Maintenance  Topic Date Due  . Flu Shot  08/27/2017  . Colon Cancer Screening  11/27/2020  . Tetanus Vaccine  08/11/2023  .  Hepatitis C: One time screening is recommended by Center for Disease Control  (CDC) for  adults born from [birth year] through 1965.   Completed  . Pneumonia vaccines  Completed    Health Maintenance, Male A healthy lifestyle and preventive care is important for your health and wellness. Ask your health care provider about what schedule of regular examinations is right for you. What should I know about weight and diet? Eat a Healthy Diet  Eat plenty of vegetables, fruits, whole grains, low-fat dairy products, and lean protein.  Do not eat a lot of foods high in solid fats, added sugars, or salt.  Maintain a Healthy Weight Regular exercise can help you achieve or maintain a healthy weight. You should:  Do at least 150 minutes of exercise each week. The exercise should increase your heart rate and make you sweat (moderate-intensity exercise).  Do strength-training exercises at least twice a week.  Watch Your Levels of Cholesterol and Blood Lipids  Have your blood tested for lipids  and cholesterol every 5 years starting at 72 years of age. If you are at high risk for heart disease, you should start having your blood tested when you are 72 years old. You may need to have your cholesterol levels checked more often if: ? Your lipid or cholesterol levels are high. ? You are older than 72 years of age. ? You are at high risk for heart disease.  What should I know about cancer screening? Many types of cancers can be detected early and may often be prevented. Lung Cancer  You should be screened every year for lung cancer if: ? You are a current smoker who has smoked for at least 30 years. ? You are a former smoker who has quit within the past 15 years.  Talk to your health care provider about your screening options, when you should start screening, and how often you should be screened.  Colorectal Cancer  Routine colorectal cancer screening usually begins at 72 years of age and should be repeated every 5-10 years until you are 72 years old. You may need to be screened more often if early forms of precancerous polyps or small growths are found. Your health care provider may recommend screening at an earlier age if you have risk factors for colon cancer.  Your health care provider may recommend using home test kits to check for hidden blood in the stool.  A small camera at the end of a tube can  be used to examine your colon (sigmoidoscopy or colonoscopy). This checks for the earliest forms of colorectal cancer.  Prostate and Testicular Cancer  Depending on your age and overall health, your health care provider may do certain tests to screen for prostate and testicular cancer.  Talk to your health care provider about any symptoms or concerns you have about testicular or prostate cancer.  Skin Cancer  Check your skin from head to toe regularly.  Tell your health care provider about any new moles or changes in moles, especially if: ? There is a change in a mole's size,  shape, or color. ? You have a mole that is larger than a pencil eraser.  Always use sunscreen. Apply sunscreen liberally and repeat throughout the day.  Protect yourself by wearing long sleeves, pants, a wide-brimmed hat, and sunglasses when outside.  What should I know about heart disease, diabetes, and high blood pressure?  If you are 35-69 years of age, have your blood pressure checked every 3-5 years. If you are 59 years of age or older, have your blood pressure checked every year. You should have your blood pressure measured twice-once when you are at a hospital or clinic, and once when you are not at a hospital or clinic. Record the average of the two measurements. To check your blood pressure when you are not at a hospital or clinic, you can use: ? An automated blood pressure machine at a pharmacy. ? A home blood pressure monitor.  Talk to your health care provider about your target blood pressure.  If you are between 33-10 years old, ask your health care provider if you should take aspirin to prevent heart disease.  Have regular diabetes screenings by checking your fasting blood sugar level. ? If you are at a normal weight and have a low risk for diabetes, have this test once every three years after the age of 26. ? If you are overweight and have a high risk for diabetes, consider being tested at a younger age or more often.  A one-time screening for abdominal aortic aneurysm (AAA) by ultrasound is recommended for men aged 86-75 years who are current or former smokers. What should I know about preventing infection? Hepatitis B If you have a higher risk for hepatitis B, you should be screened for this virus. Talk with your health care provider to find out if you are at risk for hepatitis B infection. Hepatitis C Blood testing is recommended for:  Everyone born from [birth year] through 1965.  Anyone with known risk factors for hepatitis C.  Sexually Transmitted Diseases (STDs)  You  should be screened each year for STDs including gonorrhea and chlamydia if: ? You are sexually active and are younger than 72 years of age. ? You are older than 72 years of age and your health care provider tells you that you are at risk for this type of infection. ? Your sexual activity has changed since you were last screened and you are at an increased risk for chlamydia or gonorrhea. Ask your health care provider if you are at risk.  Talk with your health care provider about whether you are at high risk of being infected with HIV. Your health care provider may recommend a prescription medicine to help prevent HIV infection.  What else can I do?  Schedule regular health, dental, and eye exams.  Stay current with your vaccines (immunizations).  Do not use any tobacco products, such as cigarettes, chewing tobacco, and e-cigarettes.  If you need help quitting, ask your health care provider.  Limit alcohol intake to no more than 2 drinks per day. One drink equals 12 ounces of beer, 5 ounces of wine, or 1 ounces of hard liquor.  Do not use street drugs.  Do not share needles.  Ask your health care provider for help if you need support or information about quitting drugs.  Tell your health care provider if you often feel depressed.  Tell your health care provider if you have ever been abused or do not feel safe at home. This information is not intended to replace advice given to you by your health care provider. Make sure you discuss any questions you have with your health care provider. Document Released: 07/12/2007 Document Revised: 09/12/2015 Document Reviewed: 10/17/2014 Elsevier Interactive Patient Education  [employer].

## 2017-07-03 DIAGNOSIS — H35351 Cystoid macular degeneration, right eye: Secondary | ICD-10-CM | POA: Diagnosis not present

## 2017-07-03 DIAGNOSIS — H35371 Puckering of macula, right eye: Secondary | ICD-10-CM | POA: Diagnosis not present

## 2017-07-03 DIAGNOSIS — H43813 Vitreous degeneration, bilateral: Secondary | ICD-10-CM | POA: Diagnosis not present

## 2017-07-25 ENCOUNTER — Other Ambulatory Visit: Payer: Self-pay | Admitting: Internal Medicine

## 2017-08-12 ENCOUNTER — Other Ambulatory Visit: Payer: Self-pay | Admitting: Internal Medicine

## 2017-08-14 DIAGNOSIS — H35351 Cystoid macular degeneration, right eye: Secondary | ICD-10-CM | POA: Diagnosis not present

## 2017-08-14 DIAGNOSIS — H35371 Puckering of macula, right eye: Secondary | ICD-10-CM | POA: Diagnosis not present

## 2017-09-06 ENCOUNTER — Other Ambulatory Visit: Payer: Self-pay | Admitting: Internal Medicine

## 2017-10-07 DIAGNOSIS — R972 Elevated prostate specific antigen [PSA]: Secondary | ICD-10-CM | POA: Diagnosis not present

## 2017-10-07 LAB — PSA: PSA: 4.27

## 2017-10-12 ENCOUNTER — Telehealth: Payer: Self-pay

## 2017-10-12 NOTE — Telephone Encounter (Signed)
PA initiated via Covermymeds; KEY: [id number]. PA approved.   [id number];[id number];Review Type:Prior Auth;Coverage Start Date:09/12/2017;Coverage End Date:10/12/2018;

## 2017-10-14 DIAGNOSIS — R972 Elevated prostate specific antigen [PSA]: Secondary | ICD-10-CM | POA: Diagnosis not present

## 2017-10-14 DIAGNOSIS — N400 Enlarged prostate without lower urinary tract symptoms: Secondary | ICD-10-CM | POA: Diagnosis not present

## 2017-10-22 DIAGNOSIS — D225 Melanocytic nevi of trunk: Secondary | ICD-10-CM | POA: Diagnosis not present

## 2017-10-22 DIAGNOSIS — L570 Actinic keratosis: Secondary | ICD-10-CM | POA: Diagnosis not present

## 2017-10-22 DIAGNOSIS — L821 Other seborrheic keratosis: Secondary | ICD-10-CM | POA: Diagnosis not present

## 2017-10-22 DIAGNOSIS — D1801 Hemangioma of skin and subcutaneous tissue: Secondary | ICD-10-CM | POA: Diagnosis not present

## 2017-10-26 DIAGNOSIS — H3581 Retinal edema: Secondary | ICD-10-CM | POA: Diagnosis not present

## 2017-10-26 DIAGNOSIS — H35351 Cystoid macular degeneration, right eye: Secondary | ICD-10-CM | POA: Diagnosis not present

## 2017-10-26 DIAGNOSIS — H35371 Puckering of macula, right eye: Secondary | ICD-10-CM | POA: Diagnosis not present

## 2017-10-27 DIAGNOSIS — H3581 Retinal edema: Secondary | ICD-10-CM | POA: Diagnosis not present

## 2017-10-28 DIAGNOSIS — H4311 Vitreous hemorrhage, right eye: Secondary | ICD-10-CM | POA: Diagnosis not present

## 2017-11-17 ENCOUNTER — Ambulatory Visit (INDEPENDENT_AMBULATORY_CARE_PROVIDER_SITE_OTHER): Payer: Medicare Other | Admitting: Internal Medicine

## 2017-11-17 ENCOUNTER — Encounter: Payer: Self-pay | Admitting: Internal Medicine

## 2017-11-17 VITALS — BP 124/66 | HR 60 | Temp 97.6°F | Resp 16 | Ht 66.0 in | Wt 174.2 lb

## 2017-11-17 DIAGNOSIS — R972 Elevated prostate specific antigen [PSA]: Secondary | ICD-10-CM

## 2017-11-17 DIAGNOSIS — E785 Hyperlipidemia, unspecified: Secondary | ICD-10-CM | POA: Diagnosis not present

## 2017-11-17 DIAGNOSIS — K219 Gastro-esophageal reflux disease without esophagitis: Secondary | ICD-10-CM | POA: Diagnosis not present

## 2017-11-17 DIAGNOSIS — Z23 Encounter for immunization: Secondary | ICD-10-CM | POA: Diagnosis not present

## 2017-11-17 LAB — COMPREHENSIVE METABOLIC PANEL
ALBUMIN: 4.2 g/dL (ref 3.5–5.2)
ALT: 28 U/L (ref 0–53)
AST: 21 U/L (ref 0–37)
Alkaline Phosphatase: 52 U/L (ref 39–117)
BUN: 11 mg/dL (ref 6–23)
CHLORIDE: 104 meq/L (ref 96–112)
CO2: 32 meq/L (ref 19–32)
CREATININE: 0.85 mg/dL (ref 0.40–1.50)
Calcium: 9.7 mg/dL (ref 8.4–10.5)
GFR: 94.04 mL/min (ref >60.00)
GLUCOSE: 89 mg/dL (ref 70–99)
POTASSIUM: 4.1 meq/L (ref 3.5–5.1)
Sodium: 140 mEq/L (ref 135–145)
Total Bilirubin: 0.7 mg/dL (ref 0.2–1.2)
Total Protein: 6.6 g/dL (ref 6.0–8.3)

## 2017-11-17 LAB — LIPID PANEL
Cholesterol: 145 mg/dL (ref 0–200)
HDL: 61.1 mg/dL (ref >39.00)
LDL CALC: 74 mg/dL (ref 0–99)
NonHDL: 84.13
Total CHOL/HDL Ratio: 2
Triglycerides: 51 mg/dL (ref 0.0–149.0)
VLDL: 10.2 mg/dL (ref 0.0–40.0)

## 2017-11-17 NOTE — Progress Notes (Signed)
Pre visit review using our clinic review tool, if applicable. No additional management support is needed unless otherwise documented below in the visit note. 

## 2017-11-17 NOTE — Progress Notes (Signed)
Subjective:    Patient ID: Rick Campbell, male    DOB: July 14, 1945, 72 y.o.   MRN: [id number]  DOS:  11/17/2017 Type of visit - description : rov Interval history: Feels well, no concerns, remains very active  Review of Systems Denies chest pain or difficulty breathing. No dysuria, gross hematuria; no  difficulty urinating  Past Medical History:  Diagnosis Date  . Adenomatous polyp of colon   . Allergy    seasonal  . Chronic headache   . Esophageal reflux   . Hyperlipidemia   . Lower urinary tract symptoms (LUTS)     Past Surgical History:  Procedure Laterality Date  . CHOLECYSTECTOMY  1998  . EYE SURGERY Bilateral 11/27/2017   cataract and retina surgery    Social History   Socioeconomic History  . Marital status: Married    Spouse name: Not on file  . Number of children: 2  . Years of education: Not on file  . Highest education level: Not on file  Occupational History  . Occupation: retired- Education administrator    Comment: Presenter, broadcasting Needs  . Financial resource strain: Not on file  . Food insecurity:    Worry: Not on file    Inability: Not on file  . Transportation needs:    Medical: Not on file    Non-medical: Not on file  Tobacco Use  . Smoking status: Never Smoker  . Smokeless tobacco: Never Used  Substance and Sexual Activity  . Alcohol use: Yes    Comment: 1 or 2 glasses of wine every week  . Drug use: No  . Sexual activity: Yes  Lifestyle  . Physical activity:    Days per week: Not on file    Minutes per session: Not on file  . Stress: Not on file  Relationships  . Social connections:    Talks on phone: Not on file    Gets together: Not on file    Attends religious service: Not on file    Active member of club or organization: Not on file    Attends meetings of clubs or organizations: Not on file    Relationship status: Not on file  . Intimate partner violence:    Fear of current or ex partner: Not on file    Emotionally abused: Not on  file    Physically abused: Not on file    Forced sexual activity: Not on file  Other Topics Concern  . Not on file  Social History Narrative   retired from [employer]  , lives w/ wife , both children in [place] as of 11/17/2017      Reactions   Sulfonamide Derivatives Hives, Swelling   Urticaria      Medication List        Accurate as of 11/17/17 11:59 PM. Always use your most recent med list.          atorvastatin 10 MG tablet Commonly known as:  LIPITOR Take 1 tablet (10 mg total) by mouth daily at 6 PM.   azelastine 0.1 % nasal spray Commonly known as:  ASTELIN Place 2 sprays into both nostrils at bedtime as needed for rhinitis or allergies.   famotidine 20 MG tablet Commonly known as:  PEPCID Take 20 mg by mouth at bedtime.   fluticasone 50 MCG/ACT nasal spray Commonly known as:  FLONASE Place 1 spray into both nostrils daily. Reported on 06/29/2015  RABEprazole 20 MG tablet Commonly known as:  ACIPHEX Take 1 tablet (20 mg total) by mouth every other day.   SENIOR MULTIVITAMIN PLUS PO Take 1 tablet by mouth daily.   tamsulosin 0.4 MG Caps capsule Commonly known as:  FLOMAX Take 1 capsule (0.4 mg total) by mouth daily after supper.          Objective:   Physical Exam BP 124/66 (BP Location: Left Arm, Patient Position: Sitting, Cuff Size: Small)   Pulse 60   Temp 97.6 F (36.4 C) (Oral)   Resp 16   Ht 5\' 6"  (1.676 m)   Wt 174 lb 4 oz (79 kg)   SpO2 97%   BMI 28.12 kg/m   General:   Well developed, NAD, see BMI.  HEENT:  Normocephalic . Face symmetric, atraumatic Neck: No thyromegaly Lungs:  CTA B Normal respiratory effort, no intercostal retractions, no accessory muscle use. Heart: RRR,  no murmur.  no pretibial edema bilaterally  Abdomen:  Not distended, soft, non-tender. No rebound or rigidity.   Skin: Not pale. Not jaundice Neurologic:  alert & oriented X3.  Speech normal, gait appropriate for age and  unassisted Psych--  Cognition and judgment appear intact.  Cooperative with normal attention span and concentration.  Behavior appropriate. No anxious or depressed appearing.     Assessment & Plan:   Assessment Hyperlipidemia GERD Seasonal allergies Obesity H/o increased PSA x1, LUTS Headaches, chronic  PLAN: Hyperlipidemia: On Lipitor, check a CMP, FLP. GERD: Controlled on AcipHex every other day Increased PSA, BPH: Symptoms controlled on Flomax every other day.  Last PSA at this office was around 8.0.  Has seen urology twice, they are monitoring the situation, last PSA at urology 4.0.  He will continue following up with them.  No symptoms. Flu shot today; s/p shingrex. RTC 8 months

## 2017-11-17 NOTE — Patient Instructions (Signed)
GO TO THE LAB : Get the blood work     GO TO THE FRONT DESK Schedule your next appointment for a  Check up in 8 months     Check the  blood pressure  monthly   Be sure your blood pressure is between 110/65 and  135/85. If it is consistently higher or lower, let me know

## 2017-11-18 NOTE — Assessment & Plan Note (Signed)
PLAN: Hyperlipidemia: On Lipitor, check a CMP, FLP. GERD: Controlled on AcipHex every other day Increased PSA, BPH: Symptoms controlled on Flomax every other day.  Last PSA at this office was around 8.0.  Has seen urology twice, they are monitoring the situation, last PSA at urology 4.0.  He will continue following up with them.  No symptoms. Flu shot today; s/p shingrex. RTC 8 months

## 2017-11-24 DIAGNOSIS — H35371 Puckering of macula, right eye: Secondary | ICD-10-CM | POA: Diagnosis not present

## 2018-01-14 ENCOUNTER — Other Ambulatory Visit: Payer: Self-pay | Admitting: Internal Medicine

## 2018-01-19 ENCOUNTER — Other Ambulatory Visit: Payer: Self-pay | Admitting: Internal Medicine

## 2018-02-24 DIAGNOSIS — H3581 Retinal edema: Secondary | ICD-10-CM | POA: Diagnosis not present

## 2018-03-12 ENCOUNTER — Other Ambulatory Visit: Payer: Self-pay | Admitting: Internal Medicine

## 2018-04-01 DIAGNOSIS — R972 Elevated prostate specific antigen [PSA]: Secondary | ICD-10-CM | POA: Diagnosis not present

## 2018-04-01 LAB — PSA: PSA: 4.4

## 2018-05-10 DIAGNOSIS — R3912 Poor urinary stream: Secondary | ICD-10-CM | POA: Diagnosis not present

## 2018-05-10 DIAGNOSIS — N401 Enlarged prostate with lower urinary tract symptoms: Secondary | ICD-10-CM | POA: Diagnosis not present

## 2018-05-10 DIAGNOSIS — R972 Elevated prostate specific antigen [PSA]: Secondary | ICD-10-CM | POA: Diagnosis not present

## 2018-05-26 ENCOUNTER — Other Ambulatory Visit: Payer: Self-pay

## 2018-05-26 MED ORDER — ATORVASTATIN CALCIUM 10 MG PO TABS: 10.0000 mg | ORAL_TABLET | Freq: Every day | ORAL | 1 refills | Status: DC

## 2018-06-27 ENCOUNTER — Encounter: Payer: Self-pay | Admitting: Internal Medicine

## 2018-07-01 NOTE — Progress Notes (Addendum)
Virtual Visit via Video Note  I connected with patient on 07/02/18 at  9:00 AM EDT by audio enabled telemedicine application and verified that I am speaking with the correct person using two identifiers.   THIS ENCOUNTER IS A VIRTUAL VISIT DUE TO COVID-19 - PATIENT WAS NOT SEEN IN THE OFFICE. PATIENT HAS CONSENTED TO VIRTUAL VISIT / TELEMEDICINE VISIT   Location of patient: home  Location of provider: office  I discussed the limitations of evaluation and management by telemedicine and the availability of in person appointments. The patient expressed understanding and agreed to proceed.   Subjective:   Rick Campbell is a 73 y.o. male who presents for Medicare Annual/Subsequent preventive examination.  Review of Systems: No ROS.  Medicare Wellness Virtual Visit.  Visual/audio telehealth visit, UTA vital signs.   See social history for additional risk factors. Cardiac Risk Factors include: advanced age (>5men, >61 women);male gender Sleep patterns:  Only sleeps 5-6 hrs. Reports feeling well rested.  Home Safety/Smoke Alarms: Feels safe in home. Smoke alarms in place.  Lives in 2 story home with wife  Male:   CCS- due 11/2021     PSA-  Lab Results  Component Value Date   PSA 8.41 (H) 11/12/2016   PSA 4.00 11/14/2015   PSA 4.45 (H) 06/29/2015       Objective:     Advanced Directives 07/02/2018 06/30/2017  Does Patient Have a Medical Advance Directive? Yes Yes  Type of Paramedic of [place];Living will [place];Living will  Does patient want to make changes to medical advance directive? No - Patient declined -  Copy of [place] in Chart? No - copy requested No - copy requested    Tobacco Social History   Tobacco Use  Smoking Status Never Smoker  Smokeless Tobacco Never Used     Counseling given: Not Answered   Clinical Intake: Pain : No/denies pain    Past Medical History:  Diagnosis Date  .  Adenomatous polyp of colon   . Allergy    seasonal  . Cataract   . Chronic headache   . Esophageal reflux   . Hyperlipidemia   . Lower urinary tract symptoms (LUTS)    Past Surgical History:  Procedure Laterality Date  . CHOLECYSTECTOMY  1998  . EYE SURGERY Bilateral 11/27/2017   cataract and retina surgery   Family History  Problem Relation Age of Onset  . Colon cancer Father        dx age 29s  . Diabetes Father   . Heart Problems Father        father CHF in his 31s  . Lung cancer Mother        was a heavy smoker  . COPD Neg Hx   . Arthritis Neg Hx   . Prostate cancer Neg Hx    Social History   Socioeconomic History  . Marital status: Married    Spouse name: Not on file  . Number of children: 2  . Years of education: Not on file  . Highest education level: Not on file  Occupational History  . Occupation: retired- Education administrator    Comment: Presenter, broadcasting Needs  . Financial resource strain: Not on file  . Food insecurity:    Worry: Not on file    Inability: Not on file  . Transportation needs:    Medical: Not on file    Non-medical: Not on file  Tobacco Use  . Smoking  status: Never Smoker  . Smokeless tobacco: Never Used  Substance and Sexual Activity  . Alcohol use: Yes    Comment: 1 or 2 glasses of wine every week  . Drug use: No  . Sexual activity: Yes  Lifestyle  . Physical activity:    Days per week: Not on file    Minutes per session: Not on file  . Stress: Not on file  Relationships  . Social connections:    Talks on phone: Not on file    Gets together: Not on file    Attends religious service: Not on file    Active member of club or organization: Not on file    Attends meetings of clubs or organizations: Not on file    Relationship status: Not on file  Other Topics Concern  . Not on file  Social History Narrative   retired from [employer]  , lives w/ wife , both children in [place]          Outpatient Encounter Medications as of 07/02/2018   Medication Sig  . atorvastatin (LIPITOR) 10 MG tablet Take 1 tablet (10 mg total) by mouth daily at 6 PM.  . azelastine (ASTELIN) 0.1 % nasal spray Place 2 sprays into both nostrils at bedtime as needed for rhinitis or allergies.  . famotidine (PEPCID) 20 MG tablet Take 20 mg by mouth at bedtime.  . fluticasone (FLONASE) 50 MCG/ACT nasal spray Place 1 spray into both nostrils daily. Reported on 06/29/2015  . Multiple Vitamins-Minerals (SENIOR MULTIVITAMIN PLUS PO) Take 1 tablet by mouth daily.    . RABEprazole (ACIPHEX) 20 MG tablet Take 1 tablet (20 mg total) by mouth every other day.  . tamsulosin (FLOMAX) 0.4 MG CAPS capsule Take 1 capsule (0.4 mg total) by mouth daily after supper. (Patient taking differently: Take 0.4 mg by mouth every other day. )   No facility-administered encounter medications on file as of 07/02/2018.     Activities of Daily Living In your present state of health, do you have any difficulty performing the following activities: 07/02/2018  Hearing? N  Vision? N  Difficulty concentrating or making decisions? N  Walking or climbing stairs? N  Dressing or bathing? N  Doing errands, shopping? N  Preparing Food and eating ? N  Using the Toilet? N  In the past six months, have you accidently leaked urine? N  Do you have problems with loss of bowel control? N  Managing your Medications? N  Managing your Finances? N  Housekeeping or managing your Housekeeping? N  Some recent data might be hidden    Patient Care Team: Colon Branch, MD as PCP - General Jarome Matin, MD as Consulting Physician (Dermatology) Tanda Rockers, MD as Consulting Physician (Pulmonary Disease) Darleen Crocker, MD as Consulting Physician (Ophthalmology) Festus Aloe, MD as Consulting Physician (Urology)   Assessment:   This is a routine wellness examination for [place]. Physical assessment deferred to PCP.  Exercise Activities and Dietary recommendations Current Exercise Habits: Home exercise  routine, Type of exercise: walking, Time (Minutes): 30, Frequency (Times/Week): 4, Weekly Exercise (Minutes/Week): 120, Intensity: Mild, Exercise limited by: None identified   Diet (meal preparation, eat out, water intake, caffeinated beverages, dairy products, fruits and vegetables): well balanced, on average, 3 meals per day. Pt reports he needs to drink more water.   Goals    . maintain current healthy lifestyle       Fall Risk Fall Risk  07/02/2018 06/30/2017 11/14/2015 06/29/2015 10/04/2014  Falls  in the past year? 0 No No No No    Depression Screen PHQ 2/9 Scores 07/02/2018 06/30/2017 11/14/2015 06/29/2015  PHQ - 2 Score 0 0 0 0    Cognitive Function Ad8 score reviewed for issues:  Issues making decisions:no  Less interest in hobbies / activities:no  Repeats questions, stories (family complaining):non  Trouble using ordinary gadgets (microwave, computer, phone):no  Forgets the month or year: no  Mismanaging finances: no  Remembering appts:no  Daily problems with thinking and/or memory:no Ad8 score is=0         Immunization History  Administered Date(s) Administered  . Influenza Split 11/26/2010  . Influenza, High Dose Seasonal PF 11/07/2014, 11/14/2015, 11/12/2016, 11/17/2017  . Influenza,inj,Quad PF,6+ Mos 11/22/2013  . Influenza-Unspecified 11/27/2012  . Pneumococcal Conjugate-13 08/10/2013  . Pneumococcal Polysaccharide-23 06/21/2010  . Td 09/15/2003  . Tetanus 08/10/2013  . Zoster 09/10/2009  . Zoster Recombinat (Shingrix) 06/02/2017, 08/06/2017   Screening Tests Health Maintenance  Topic Date Due  . INFLUENZA VACCINE  08/28/2018  . COLONOSCOPY  11/27/2020  . TETANUS/TDAP  08/11/2023  . Hepatitis C Screening  Completed  . PNA vac Low Risk Adult  Completed       Plan:   See you next year!  Continue to eat heart healthy diet (full of fruits, vegetables, whole grains, lean protein, water--limit salt, fat, and sugar intake) and increase physical activity  as tolerated.  Bring a copy of your living will and/or healthcare power of attorney to your next office visit.    I have personally reviewed and noted the following in the patient's chart:   . Medical and social history . Use of alcohol, tobacco or illicit drugs  . Current medications and supplements . Functional ability and status . Nutritional status . Physical activity . Advanced directives . List of other physicians . Hospitalizations, surgeries, and ER visits in previous 12 months . Vitals . Screenings to include cognitive, depression, and falls . Referrals and appointments  In addition, I have reviewed and discussed with patient certain preventive protocols, quality metrics, and best practice recommendations. A written personalized care plan for preventive services as well as general preventive health recommendations were provided to patient.     Naaman Plummer Moran, [place]  07/02/2018  Kathlene November, MD

## 2018-07-02 ENCOUNTER — Other Ambulatory Visit: Payer: Self-pay

## 2018-07-02 ENCOUNTER — Encounter: Payer: Self-pay | Admitting: *Deleted

## 2018-07-02 ENCOUNTER — Ambulatory Visit (INDEPENDENT_AMBULATORY_CARE_PROVIDER_SITE_OTHER): Payer: Medicare Other | Admitting: *Deleted

## 2018-07-02 DIAGNOSIS — Z0000 Encounter for general adult medical examination without abnormal findings: Secondary | ICD-10-CM

## 2018-07-02 NOTE — Patient Instructions (Signed)
See you next year!  Continue to eat heart healthy diet (full of fruits, vegetables, whole grains, lean protein, water--limit salt, fat, and sugar intake) and increase physical activity as tolerated.  Bring a copy of your living will and/or healthcare power of attorney to your next office visit.   Rick Campbell , Thank you for taking time to come for your Medicare Wellness Visit. I appreciate your ongoing commitment to your health goals. Please review the following plan we discussed and let me know if I can assist you in the future.   These are the goals we discussed: Goals    . maintain current healthy lifestyle       This is a list of the screening recommended for you and due dates:  Health Maintenance  Topic Date Due  . Flu Shot  08/28/2018  . Colon Cancer Screening  11/27/2020  . Tetanus Vaccine  08/11/2023  .  Hepatitis C: One time screening is recommended by Center for Disease Control  (CDC) for  adults born from [birth year] through 1965.   Completed  . Pneumonia vaccines  Completed    Health Maintenance After Age 68 After age 19, you are at a higher risk for certain long-term diseases and infections as well as injuries from falls. Falls are a major cause of broken bones and head injuries in people who are older than age 91. Getting regular preventive care can help to keep you healthy and well. Preventive care includes getting regular testing and making lifestyle changes as recommended by your health care provider. Talk with your health care provider about:  Which screenings and tests you should have. A screening is a test that checks for a disease when you have no symptoms.  A diet and exercise plan that is right for you. What should I know about screenings and tests to prevent falls? Screening and testing are the best ways to find a health problem early. Early diagnosis and treatment give you the best chance of managing medical conditions that are common after age 55. Certain conditions  and lifestyle choices may make you more likely to have a fall. Your health care provider may recommend:  Regular vision checks. Poor vision and conditions such as cataracts can make you more likely to have a fall. If you wear glasses, make sure to get your prescription updated if your vision changes.  Medicine review. Work with your health care provider to regularly review all of the medicines you are taking, including over-the-counter medicines. Ask your health care provider about any side effects that may make you more likely to have a fall. Tell your health care provider if any medicines that you take make you feel dizzy or sleepy.  Osteoporosis screening. Osteoporosis is a condition that causes the bones to get weaker. This can make the bones weak and cause them to break more easily.  Blood pressure screening. Blood pressure changes and medicines to control blood pressure can make you feel dizzy.  Strength and balance checks. Your health care provider may recommend certain tests to check your strength and balance while standing, walking, or changing positions.  Foot health exam. Foot pain and numbness, as well as not wearing proper footwear, can make you more likely to have a fall.  Depression screening. You may be more likely to have a fall if you have a fear of falling, feel emotionally low, or feel unable to do activities that you used to do.  Alcohol use screening. Using too much alcohol  can affect your balance and may make you more likely to have a fall. What actions can I take to lower my risk of falls? General instructions  Talk with your health care provider about your risks for falling. Tell your health care provider if: ? You fall. Be sure to tell your health care provider about all falls, even ones that seem minor. ? You feel dizzy, sleepy, or off-balance.  Take over-the-counter and prescription medicines only as told by your health care provider. These include any supplements.   Eat a healthy diet and maintain a healthy weight. A healthy diet includes low-fat dairy products, low-fat (lean) meats, and fiber from whole grains, beans, and lots of fruits and vegetables. Home safety  Remove any tripping hazards, such as rugs, cords, and clutter.  Install safety equipment such as grab bars in bathrooms and safety rails on stairs.  Keep rooms and walkways well-lit. Activity   Follow a regular exercise program to stay fit. This will help you maintain your balance. Ask your health care provider what types of exercise are appropriate for you.  If you need a cane or walker, use it as recommended by your health care provider.  Wear supportive shoes that have nonskid soles. Lifestyle  Do not drink alcohol if your health care provider tells you not to drink.  If you drink alcohol, limit how much you have: ? 0-1 drink a day for women. ? 0-2 drinks a day for men.  Be aware of how much alcohol is in your drink. In the [place] one drink equals one typical bottle of beer (12 oz), one-half glass of wine (5 oz), or one shot of hard liquor (1 oz).  Do not use any products that contain nicotine or tobacco, such as cigarettes and e-cigarettes. If you need help quitting, ask your health care provider. Summary  Having a healthy lifestyle and getting preventive care can help to protect your health and wellness after age 23.  Screening and testing are the best way to find a health problem early and help you avoid having a fall. Early diagnosis and treatment give you the best chance for managing medical conditions that are more common for people who are older than age 12.  Falls are a major cause of broken bones and head injuries in people who are older than age 37. Take precautions to prevent a fall at home.  Work with your health care provider to learn what changes you can make to improve your health and wellness and to prevent falls. This information is not intended to replace  advice given to you by your health care provider. Make sure you discuss any questions you have with your health care provider. Document Released: 11/26/2016 Document Revised: 11/26/2016 Document Reviewed: 11/26/2016 Elsevier Interactive Patient Education  2019 [employer].

## 2018-07-19 ENCOUNTER — Encounter: Payer: Self-pay | Admitting: Internal Medicine

## 2018-07-20 ENCOUNTER — Encounter: Payer: Self-pay | Admitting: Internal Medicine

## 2018-07-20 ENCOUNTER — Other Ambulatory Visit: Payer: Self-pay

## 2018-07-20 ENCOUNTER — Ambulatory Visit (INDEPENDENT_AMBULATORY_CARE_PROVIDER_SITE_OTHER): Payer: Medicare Other | Admitting: Internal Medicine

## 2018-07-20 VITALS — BP 128/66 | Temp 98.0°F | Resp 16 | Ht 66.0 in | Wt 178.2 lb

## 2018-07-20 DIAGNOSIS — K219 Gastro-esophageal reflux disease without esophagitis: Secondary | ICD-10-CM

## 2018-07-20 DIAGNOSIS — R972 Elevated prostate specific antigen [PSA]: Secondary | ICD-10-CM

## 2018-07-20 DIAGNOSIS — E785 Hyperlipidemia, unspecified: Secondary | ICD-10-CM | POA: Diagnosis not present

## 2018-07-20 NOTE — Progress Notes (Signed)
Pre visit review using our clinic review tool, if applicable. No additional management support is needed unless otherwise documented below in the visit note. 

## 2018-07-20 NOTE — Patient Instructions (Signed)
    GO TO THE FRONT DESK Schedule your next appointment  For a check up in 6 months  High dose flu shot early this year

## 2018-07-20 NOTE — Assessment & Plan Note (Signed)
Preventive care reviewed: Status post Shingrix x2 Had a colonoscopy 11-2015, negative, recommended 5 years due to [facility]

## 2018-07-20 NOTE — Progress Notes (Signed)
Subjective:    Patient ID: Rick Campbell, male    DOB: 09-16-1945, 73 y.o.   MRN: [id number]  DOS:  07/20/2018 Type of visit - description:  rov In general feeling well, has no major concerns. We reviewed together his most recent labs and medication list. Note from urology reviewed COVID-19: Taking good precautions    Review of Systems Denies fever chills No chest pain no difficulty breathing No nausea, vomiting, diarrhea No anxiety or depression   Past Medical History:  Diagnosis Date  . Adenomatous polyp of colon   . Allergy    seasonal  . Cataract   . Chronic headache   . Esophageal reflux   . Hyperlipidemia   . Lower urinary tract symptoms (LUTS)     Past Surgical History:  Procedure Laterality Date  . CHOLECYSTECTOMY  1998  . EYE SURGERY Bilateral 11/27/2017   cataract and retina surgery    Social History   Socioeconomic History  . Marital status: Married    Spouse name: Not on file  . Number of children: 2  . Years of education: Not on file  . Highest education level: Not on file  Occupational History  . Occupation: retired- Education administrator    Comment: Presenter, broadcasting Needs  . Financial resource strain: Not on file  . Food insecurity    Worry: Not on file    Inability: Not on file  . Transportation needs    Medical: Not on file    Non-medical: Not on file  Tobacco Use  . Smoking status: Never Smoker  . Smokeless tobacco: Never Used  Substance and Sexual Activity  . Alcohol use: Yes    Comment: 1 or 2 glasses of wine every week  . Drug use: No  . Sexual activity: Yes  Lifestyle  . Physical activity    Days per week: Not on file    Minutes per session: Not on file  . Stress: Not on file  Relationships  . Social Herbalist on phone: Not on file    Gets together: Not on file    Attends religious service: Not on file    Active member of club or organization: Not on file    Attends meetings of clubs or organizations: Not on file   Relationship status: Not on file  . Intimate partner violence    Fear of current or ex partner: Not on file    Emotionally abused: Not on file    Physically abused: Not on file    Forced sexual activity: Not on file  Other Topics Concern  . Not on file  Social History Narrative   retired from [employer]  , lives w/ wife , both children in [place] as of 07/20/2018      Reactions   Sulfonamide Derivatives Hives, Swelling   Urticaria      Medication List       Accurate as of July 20, 2018  8:35 AM. If you have any questions, ask your nurse or doctor.        atorvastatin 10 MG tablet Commonly known as: LIPITOR Take 1 tablet (10 mg total) by mouth daily at 6 PM.   azelastine 0.1 % nasal spray Commonly known as: ASTELIN Place 2 sprays into both nostrils at bedtime as needed for rhinitis or allergies.   famotidine 20 MG tablet Commonly known as: PEPCID Take 20 mg by mouth  at bedtime.   fluticasone 50 MCG/ACT nasal spray Commonly known as: FLONASE Place 1 spray into both nostrils daily. Reported on 06/29/2015   RABEprazole 20 MG tablet Commonly known as: ACIPHEX Take 1 tablet (20 mg total) by mouth every other day.   SENIOR MULTIVITAMIN PLUS PO Take 1 tablet by mouth daily.   tamsulosin 0.4 MG Caps capsule Commonly known as: FLOMAX Take 1 capsule (0.4 mg total) by mouth daily after supper. What changed: when to take this           Objective:   Physical Exam BP 128/66 (BP Location: Left Arm, Patient Position: Sitting, Cuff Size: Small)   Temp 98 F (36.7 C) (Oral)   Resp 16   Ht 5\' 6"  (1.676 m)   Wt 178 lb 4 oz (80.9 kg)   SpO2 100%   BMI 28.77 kg/m  General:   Well developed, NAD, BMI noted.  HEENT:  Normocephalic . Face symmetric, atraumatic Neck: No thyromegaly Lungs:  CTA B Normal respiratory effort, no intercostal retractions, no accessory muscle use. Heart: RRR,  no murmur.  no pretibial edema bilaterally  Abdomen:  Not  distended, soft, non-tender. No rebound or rigidity.   Skin: Not pale. Not jaundice Neurologic:  alert & oriented X3.  Speech normal, gait appropriate for age and unassisted Psych--  Cognition and judgment appear intact.  Cooperative with normal attention span and concentration.  Behavior appropriate. No anxious or depressed appearing.     Assessment      Assessment Hyperlipidemia GERD Seasonal allergies Obesity H/o increased PSA , LUTS, sees urology Headaches, chronic  PLAN: Hyperlipidemia: Well-controlled, continue Lipitor GERD: Controlled, continue PPIs Increase PSA: Last visit urology 04/2018, he had no symptoms, PSA according to the patient was hovering around 4.0.  They will recheck on him in few months Preventive care: Data reviewed and updated.  Recommend high-dose flu shot earlier this year. RTC 6 months fasting

## 2018-07-21 NOTE — Assessment & Plan Note (Signed)
Hyperlipidemia: Well-controlled, continue Lipitor GERD: Controlled, continue PPIs Increase PSA: Last visit urology 04/2018, he had no symptoms, PSA according to the patient was hovering around 4.0.  They will recheck on him in few months Preventive care: Data reviewed and updated.  Recommend high-dose flu shot earlier this year. RTC 6 months fasting

## 2018-09-29 ENCOUNTER — Telehealth: Payer: Self-pay

## 2018-09-29 NOTE — Telephone Encounter (Signed)
PA initiated via Covermymeds; KEY: [id number]. PA approved.   [id number];Review Type:Prior Auth;Coverage Start Date:08/30/2018;Coverage End Date:09/29/2019

## 2018-10-05 ENCOUNTER — Other Ambulatory Visit: Payer: Self-pay | Admitting: Internal Medicine

## 2018-10-12 ENCOUNTER — Ambulatory Visit (INDEPENDENT_AMBULATORY_CARE_PROVIDER_SITE_OTHER): Payer: Medicare Other

## 2018-10-12 ENCOUNTER — Other Ambulatory Visit: Payer: Self-pay

## 2018-10-12 DIAGNOSIS — Z23 Encounter for immunization: Secondary | ICD-10-CM

## 2018-10-19 DIAGNOSIS — H33311 Horseshoe tear of retina without detachment, right eye: Secondary | ICD-10-CM | POA: Diagnosis not present

## 2018-10-19 DIAGNOSIS — H35371 Puckering of macula, right eye: Secondary | ICD-10-CM | POA: Diagnosis not present

## 2018-10-25 DIAGNOSIS — D225 Melanocytic nevi of trunk: Secondary | ICD-10-CM | POA: Diagnosis not present

## 2018-10-25 DIAGNOSIS — L570 Actinic keratosis: Secondary | ICD-10-CM | POA: Diagnosis not present

## 2018-10-25 DIAGNOSIS — H61001 Unspecified perichondritis of right external ear: Secondary | ICD-10-CM | POA: Diagnosis not present

## 2018-10-25 DIAGNOSIS — L814 Other melanin hyperpigmentation: Secondary | ICD-10-CM | POA: Diagnosis not present

## 2018-10-25 DIAGNOSIS — D1801 Hemangioma of skin and subcutaneous tissue: Secondary | ICD-10-CM | POA: Diagnosis not present

## 2018-10-25 DIAGNOSIS — L821 Other seborrheic keratosis: Secondary | ICD-10-CM | POA: Diagnosis not present

## 2018-10-25 DIAGNOSIS — L720 Epidermal cyst: Secondary | ICD-10-CM | POA: Diagnosis not present

## 2018-10-25 LAB — PSA: PSA: 3.57

## 2018-10-29 DIAGNOSIS — R3912 Poor urinary stream: Secondary | ICD-10-CM | POA: Diagnosis not present

## 2018-10-29 DIAGNOSIS — R972 Elevated prostate specific antigen [PSA]: Secondary | ICD-10-CM | POA: Diagnosis not present

## 2018-10-29 DIAGNOSIS — N401 Enlarged prostate with lower urinary tract symptoms: Secondary | ICD-10-CM | POA: Diagnosis not present

## 2018-11-08 ENCOUNTER — Encounter: Payer: Self-pay | Admitting: Internal Medicine

## 2018-11-15 ENCOUNTER — Other Ambulatory Visit: Payer: Self-pay | Admitting: Internal Medicine

## 2019-01-08 ENCOUNTER — Other Ambulatory Visit: Payer: Self-pay | Admitting: Internal Medicine

## 2019-02-01 ENCOUNTER — Encounter: Payer: Self-pay | Admitting: Internal Medicine

## 2019-02-02 ENCOUNTER — Other Ambulatory Visit: Payer: Self-pay

## 2019-02-02 ENCOUNTER — Ambulatory Visit (INDEPENDENT_AMBULATORY_CARE_PROVIDER_SITE_OTHER): Payer: Medicare Other | Admitting: Internal Medicine

## 2019-02-02 VITALS — BP 115/70

## 2019-02-02 DIAGNOSIS — E785 Hyperlipidemia, unspecified: Secondary | ICD-10-CM | POA: Diagnosis not present

## 2019-02-02 DIAGNOSIS — K219 Gastro-esophageal reflux disease without esophagitis: Secondary | ICD-10-CM

## 2019-02-02 NOTE — Progress Notes (Signed)
Subjective:    Patient ID: Rick Campbell, male    DOB: 1945/06/13, 74 y.o.   MRN: [id number]  DOS:  02/02/2019 Type of visit - description: Virtual Visit via Video Note  I connected with the above patient  by a video enabled telemedicine application and verified that I am speaking with the correct person using two identifiers.   THIS ENCOUNTER IS A VIRTUAL VISIT DUE TO COVID-19 - PATIENT WAS NOT SEEN IN THE OFFICE. PATIENT HAS CONSENTED TO VIRTUAL VISIT / TELEMEDICINE VISIT   Location of patient: home  Location of provider: office  I discussed the limitations of evaluation and management by telemedicine and the availability of in person appointments. The patient expressed understanding and agreed to proceed.  History of Present Illness: Routine visit Has no major concerns, doing well. Ambulatory BPs reviewed   Review of Systems Denies chest pain or difficulty breathing No nausea, vomiting, diarrhea No headache, no dizziness.  Past Medical History:  Diagnosis Date  . Adenomatous polyp of colon   . Allergy    seasonal  . Cataract   . Chronic headache   . Esophageal reflux   . Hyperlipidemia   . Lower urinary tract symptoms (LUTS)     Past Surgical History:  Procedure Laterality Date  . CHOLECYSTECTOMY  1998  . EYE SURGERY Bilateral 11/27/2017   cataract and retina surgery    Social History   Socioeconomic History  . Marital status: Married    Spouse name: Not on file  . Number of children: 2  . Years of education: Not on file  . Highest education level: Not on file  Occupational History  . Occupation: retired- Education administrator    Comment: Marketing  Tobacco Use  . Smoking status: Never Smoker  . Smokeless tobacco: Never Used  Substance and Sexual Activity  . Alcohol use: Yes    Comment: 1 or 2 glasses of wine every week  . Drug use: No  . Sexual activity: Yes  Other Topics Concern  . Not on file  Social History Narrative   retired from [employer]  , lives w/  wife , both children in [place] Strain:   . Difficulty of Paying Living Expenses: Not on file  Food Insecurity:   . Worried About Charity fundraiser in the Last Year: Not on file  . Ran Out of Food in the Last Year: Not on file  Transportation Needs:   . Lack of Transportation (Medical): Not on file  . Lack of Transportation (Non-Medical): Not on file  Physical Activity:   . Days of Exercise per Week: Not on file  . Minutes of Exercise per Session: Not on file  Stress:   . Feeling of Stress : Not on file  Social Connections:   . Frequency of Communication with Friends and Family: Not on file  . Frequency of Social Gatherings with Friends and Family: Not on file  . Attends Religious Services: Not on file  . Active Member of Clubs or Organizations: Not on file  . Attends Archivist Meetings: Not on file  . Marital Status: Not on file  Intimate Partner Violence:   . Fear of Current or Ex-Partner: Not on file  . Emotionally Abused: Not on file  . Physically Abused: Not on file  . Sexually Abused: Not on file      Allergies as of 02/02/2019  Reactions   Sulfonamide Derivatives Hives, Swelling   Urticaria      Medication List       Accurate as of February 02, 2019  8:43 AM. If you have any questions, ask your nurse or doctor.        atorvastatin 10 MG tablet Commonly known as: LIPITOR Take 1 tablet (10 mg total) by mouth daily at 6 PM.   RABEprazole 20 MG tablet Commonly known as: ACIPHEX Take 1 tablet (20 mg total) by mouth every other day.   SENIOR MULTIVITAMIN PLUS PO Take 1 tablet by mouth daily.   tamsulosin 0.4 MG Caps capsule Commonly known as: FLOMAX Take 1 capsule (0.4 mg total) by mouth every other day.           Objective:   Physical Exam There were no vitals taken for this visit. Virtual video visit, alert oriented x3, no apparent distress.  Normal speech, face symmetric     Assessment     Assessment Hyperlipidemia GERD Seasonal allergies Obesity H/o increased PSA , LUTS, sees urology Headaches, chronic  PLAN: Hyperlipidemia: Continue Lipitor, check a CMP, FLP GERD: On AcipHex every other day, symptoms controlled, check a CBC Blood pressure: At home consistently 115/70, 120/80.  He is concerned because sometimes when she goes to see dentist or eye doctor BP is as high as 135/80.  No reason to be concerned, recommended to continue monitoring his blood pressures, BPs are normal L UTS: On Flomax every other day, last visit with me urology few months ago, PSA was actually down.  Next visit with urology 1 year. Plan: RTC for fasting labs in few days RTC with me in 6 months.  Will call and schedule    I discussed the assessment and treatment plan with the patient. The patient was provided an opportunity to ask questions and all were answered. The patient agreed with the plan and demonstrated an understanding of the instructions.   The patient was advised to call back or seek an in-person evaluation if the symptoms worsen or if the condition fails to improve as anticipated.

## 2019-02-03 NOTE — Assessment & Plan Note (Signed)
Hyperlipidemia: Continue Lipitor, check a CMP, FLP GERD: On AcipHex every other day, symptoms controlled, check a CBC Blood pressure: At home consistently 115/70, 120/80.  He is concerned because sometimes when she goes to see dentist or eye doctor BP is as high as 135/80.  No reason to be concerned, recommended to continue monitoring his blood pressures, BPs are normal L UTS: On Flomax every other day, last visit with me urology few months ago, PSA was actually down.  Next visit with urology 1 year. Plan: RTC for fasting labs in few days RTC with me in 6 months.  Will call and schedule

## 2019-02-09 ENCOUNTER — Other Ambulatory Visit: Payer: Self-pay

## 2019-02-09 ENCOUNTER — Other Ambulatory Visit (INDEPENDENT_AMBULATORY_CARE_PROVIDER_SITE_OTHER): Payer: Medicare Other

## 2019-02-09 DIAGNOSIS — E785 Hyperlipidemia, unspecified: Secondary | ICD-10-CM

## 2019-02-09 LAB — COMPREHENSIVE METABOLIC PANEL
ALT: 32 U/L (ref 0–53)
AST: 21 U/L (ref 0–37)
Albumin: 4.2 g/dL (ref 3.5–5.2)
Alkaline Phosphatase: 54 U/L (ref 39–117)
BUN: 22 mg/dL (ref 6–23)
CO2: 30 mEq/L (ref 19–32)
Calcium: 9.4 mg/dL (ref 8.4–10.5)
Chloride: 105 mEq/L (ref 96–112)
Creatinine, Ser: 1.18 mg/dL (ref 0.40–1.50)
GFR: 60.39 mL/min (ref >60.00)
Glucose, Bld: 98 mg/dL (ref 70–99)
Potassium: 4.1 mEq/L (ref 3.5–5.1)
Sodium: 141 mEq/L (ref 135–145)
Total Bilirubin: 0.5 mg/dL (ref 0.2–1.2)
Total Protein: 6.6 g/dL (ref 6.0–8.3)

## 2019-02-09 LAB — LIPID PANEL
Cholesterol: 173 mg/dL (ref 0–200)
HDL: 59 mg/dL (ref >39.00)
LDL Cholesterol: 105 mg/dL — ABNORMAL HIGH (ref 0–99)
NonHDL: 114.28
Total CHOL/HDL Ratio: 3
Triglycerides: 47 mg/dL (ref 0.0–149.0)
VLDL: 9.4 mg/dL (ref 0.0–40.0)

## 2019-03-06 ENCOUNTER — Ambulatory Visit: Payer: Medicare Other

## 2019-03-23 ENCOUNTER — Ambulatory Visit: Payer: Medicare Other

## 2019-07-04 NOTE — Progress Notes (Signed)
I connected with Tyke today by telephone and verified that I am speaking with the correct person using two identifiers. Location patient: home Location provider: work Persons participating in the virtual visit: patient, Marine scientist.    I discussed the limitations, risks, security and privacy concerns of performing an evaluation and management service by telephone and the availability of in person appointments. I also discussed with the patient that there may be a patient responsible charge related to this service. The patient expressed understanding and verbally consented to this telephonic visit.    Interactive audio and video telecommunications were attempted between this provider and patient, however failed, due to patient having technical difficulties OR patient did not have access to video capability.  We continued and completed visit with audio only.  Some vital signs may be absent or patient reported.    Subjective:   Rick Campbell is a 74 y.o. male who presents for Medicare Annual/Subsequent preventive examination.  Review of Systems:  Home Safety/Smoke Alarms: Feels safe in home. Smoke alarms in place.  Lives in 2 story home w/ wife. Does well w/ stairs.  Male:   CCS- due 11/2021.    PSA-  Lab Results  Component Value Date   PSA 3.57 10/25/2018   PSA 4.40 04/01/2018   PSA 4.27 10/07/2017       Objective:    Vitals: Unable to assess. This visit is enabled though telemedicine due to Covid 19.   Advanced Directives 07/05/2019 07/02/2018 06/30/2017  Does Patient Have a Medical Advance Directive? Yes Yes Yes  Type of Paramedic of [place];Living will [place];Living will [place];Living will  Does patient want to make changes to medical advance directive? No - Patient declined No - Patient declined -  Copy of [place] in Chart? No - copy requested No - copy requested No - copy requested     Tobacco Social History   Tobacco Use  Smoking Status Never Smoker  Smokeless Tobacco Never Used     Counseling given: Not Answered   Clinical Intake: Pain : No/denies pain   Past Medical History:  Diagnosis Date  . Adenomatous polyp of colon   . Allergy    seasonal  . Cataract   . Chronic headache   . Esophageal reflux   . Hyperlipidemia   . Lower urinary tract symptoms (LUTS)    Past Surgical History:  Procedure Laterality Date  . CHOLECYSTECTOMY  1998  . EYE SURGERY Bilateral 11/27/2017   cataract and retina surgery   Family History  Problem Relation Age of Onset  . Colon cancer Father        dx age 58s  . Diabetes Father   . Heart Problems Father        father CHF in his 22s  . Lung cancer Mother        was a heavy smoker  . COPD Neg Hx   . Arthritis Neg Hx   . Prostate cancer Neg Hx    Social History   Socioeconomic History  . Marital status: Married    Spouse name: Not on file  . Number of children: 2  . Years of education: Not on file  . Highest education level: Not on file  Occupational History  . Occupation: retired- Education administrator    Comment: Marketing  Tobacco Use  . Smoking status: Never Smoker  . Smokeless tobacco: Never Used  Substance and Sexual Activity  . Alcohol use:  Yes    Comment: 1 or 2 glasses of wine every week  . Drug use: No  . Sexual activity: Yes  Other Topics Concern  . Not on file  Social History Narrative   retired from [employer]  , lives w/ wife , both children in [place] Strain: [place]   . Difficulty of Paying Living Expenses: Not hard at all  Food Insecurity: No Food Insecurity  . Worried About Charity fundraiser in the Last Year: Never true  . Ran Out of Food in the Last Year: Never true  Transportation Needs: No Transportation Needs  . Lack of Transportation (Medical): No  . Lack of Transportation (Non-Medical): No  Physical Activity:   . Days of  Exercise per Week:   . Minutes of Exercise per Session:   Stress:   . Feeling of Stress :   Social Connections:   . Frequency of Communication with Friends and Family:   . Frequency of Social Gatherings with Friends and Family:   . Attends Religious Services:   . Active Member of Clubs or Organizations:   . Attends Archivist Meetings:   Marland Kitchen Marital Status:     Outpatient Encounter Medications as of 07/05/2019  Medication Sig  . atorvastatin (LIPITOR) 10 MG tablet Take 1 tablet (10 mg total) by mouth daily at 6 PM.  . Multiple Vitamins-Minerals (SENIOR MULTIVITAMIN PLUS PO) Take 1 tablet by mouth daily.    . RABEprazole (ACIPHEX) 20 MG tablet Take 1 tablet (20 mg total) by mouth every other day.  . tamsulosin (FLOMAX) 0.4 MG CAPS capsule Take 1 capsule (0.4 mg total) by mouth every other day.   No facility-administered encounter medications on file as of 07/05/2019.    Activities of Daily Living In your present state of health, do you have any difficulty performing the following activities: 07/05/2019  Hearing? N  Vision? N  Difficulty concentrating or making decisions? N  Walking or climbing stairs? N  Dressing or bathing? N  Doing errands, shopping? N  Preparing Food and eating ? N  Using the Toilet? N  In the past six months, have you accidently leaked urine? N  Do you have problems with loss of bowel control? N  Managing your Medications? N  Managing your Finances? N  Housekeeping or managing your Housekeeping? N  Some recent data might be hidden    Patient Care Team: Colon Branch, MD as PCP - General Jarome Matin, MD as Consulting Physician (Dermatology) Tanda Rockers, MD as Consulting Physician (Pulmonary Disease) Darleen Crocker, MD as Consulting Physician (Ophthalmology) Festus Aloe, MD as Consulting Physician (Urology)   Assessment:   This is a routine wellness examination for Rick Campbell. Physical assessment deferred to PCP.  Exercise Activities and  Dietary recommendations Current Exercise Habits: Home exercise routine, Type of exercise: walking;stretching;strength training/weights, Time (Minutes): 45, Frequency (Times/Week): 5, Weekly Exercise (Minutes/Week): 225, Intensity: Mild, Exercise limited by: None identified Diet (meal preparation, eat out, water intake, caffeinated beverages, dairy products, fruits and vegetables): in general, a "healthy" diet  , well balanced   Goals    . maintain current healthy lifestyle       Fall Risk Fall Risk  07/05/2019 07/02/2018 06/30/2017 11/14/2015 06/29/2015  Falls in the past year? 0 0 No No No  Number falls in past yr: 0 - - - -  Injury with Fall? 0 - - - -  Follow up Education provided;Falls prevention discussed - - - -   Depression Screen PHQ 2/9 Scores 07/05/2019 07/02/2018 06/30/2017 11/14/2015  PHQ - 2 Score 0 0 0 0    Cognitive Function Ad8 score reviewed for issues:  Issues making decisions:no  Less interest in hobbies / activities:no  Repeats questions, stories (family complaining):no  Trouble using ordinary gadgets (microwave, computer, phone):no  Forgets the month or year: no  Mismanaging finances: no  Remembering appts:no  Daily problems with thinking and/or memory:no Ad8 score is=0          Immunization History  Administered Date(s) Administered  . Fluad Quad(high Dose 65+) 10/12/2018  . Influenza Split 11/26/2010  . Influenza, High Dose Seasonal PF 11/07/2014, 11/14/2015, 11/12/2016, 11/17/2017  . Influenza,inj,Quad PF,6+ Mos 11/22/2013  . Influenza-Unspecified 11/27/2012  . PFIZER SARS-COV-2 Vaccination 02/22/2019, 03/15/2019  . Pneumococcal Conjugate-13 08/10/2013  . Pneumococcal Polysaccharide-23 06/21/2010  . Td 09/15/2003  . Tetanus 08/10/2013  . Zoster 09/10/2009  . Zoster Recombinat (Shingrix) 06/02/2017, 08/06/2017    Screening Tests Health Maintenance  Topic Date Due  . INFLUENZA VACCINE  08/28/2019  . COLONOSCOPY  11/27/2020  . TETANUS/TDAP   08/11/2023  . COVID-19 Vaccine  Completed  . Hepatitis C Screening  Completed  . PNA vac Low Risk Adult  Completed       Plan:     Please schedule your next medicare wellness visit with me in 1 yr.  Continue to eat heart healthy diet (full of fruits, vegetables, whole grains, lean protein, water--limit salt, fat, and sugar intake) and increase physical activity as tolerated.  Continue doing brain stimulating activities (puzzles, reading, adult coloring books, staying active) to keep memory sharp.   Bring a copy of your living will and/or healthcare power of attorney to your next office visit.    I have personally reviewed and noted the following in the patient's chart:   . Medical and social history . Use of alcohol, tobacco or illicit drugs  . Current medications and supplements . Functional ability and status . Nutritional status . Physical activity . Advanced directives . List of other physicians . Hospitalizations, surgeries, and ER visits in previous 12 months . Vitals . Screenings to include cognitive, depression, and falls . Referrals and appointments  In addition, I have reviewed and discussed with patient certain preventive protocols, quality metrics, and best practice recommendations. A written personalized care plan for preventive services as well as general preventive health recommendations were provided to patient.     Shela Nevin, [place]  07/05/2019

## 2019-07-05 ENCOUNTER — Ambulatory Visit (INDEPENDENT_AMBULATORY_CARE_PROVIDER_SITE_OTHER): Payer: Medicare Other | Admitting: *Deleted

## 2019-07-05 ENCOUNTER — Other Ambulatory Visit: Payer: Self-pay

## 2019-07-05 ENCOUNTER — Encounter: Payer: Self-pay | Admitting: *Deleted

## 2019-07-05 DIAGNOSIS — Z0000 Encounter for general adult medical examination without abnormal findings: Secondary | ICD-10-CM

## 2019-07-05 NOTE — Patient Instructions (Signed)
Please schedule your next medicare wellness visit with me in 1 yr.  Continue to eat heart healthy diet (full of fruits, vegetables, whole grains, lean protein, water--limit salt, fat, and sugar intake) and increase physical activity as tolerated.  Continue doing brain stimulating activities (puzzles, reading, adult coloring books, staying active) to keep memory sharp.   Bring a copy of your living will and/or healthcare power of attorney to your next office visit.   Mr. Rick Campbell , Thank you for taking time to come for your Medicare Wellness Visit. I appreciate your ongoing commitment to your health goals. Please review the following plan we discussed and let me know if I can assist you in the future.   These are the goals we discussed: Goals    . maintain current healthy lifestyle       This is a list of the screening recommended for you and due dates:  Health Maintenance  Topic Date Due  . Flu Shot  08/28/2019  . Colon Cancer Screening  11/27/2020  . Tetanus Vaccine  08/11/2023  . COVID-19 Vaccine  Completed  .  Hepatitis C: One time screening is recommended by Center for Disease Control  (CDC) for  adults born from [birth year] through 1965.   Completed  . Pneumonia vaccines  Completed    Preventive Care 41 Years and Older, Male Preventive care refers to lifestyle choices and visits with your health care provider that can promote health and wellness. This includes:  A yearly physical exam. This is also called an annual well check.  Regular dental and eye exams.  Immunizations.  Screening for certain conditions.  Healthy lifestyle choices, such as diet and exercise. What can I expect for my preventive care visit? Physical exam Your health care provider will check:  Height and weight. These may be used to calculate body mass index (BMI), which is a measurement that tells if you are at a healthy weight.  Heart rate and blood pressure.  Your skin for abnormal  spots. Counseling Your health care provider may ask you questions about:  Alcohol, tobacco, and drug use.  Emotional well-being.  Home and relationship well-being.  Sexual activity.  Eating habits.  History of falls.  Memory and ability to understand (cognition).  Work and work Statistician. What immunizations do I need?  Influenza (flu) vaccine  This is recommended every year. Tetanus, diphtheria, and pertussis (Tdap) vaccine  You may need a Td booster every 10 years. Varicella (chickenpox) vaccine  You may need this vaccine if you have not already been vaccinated. Zoster (shingles) vaccine  You may need this after age 61. Pneumococcal conjugate (PCV13) vaccine  One dose is recommended after age 51. Pneumococcal polysaccharide (PPSV23) vaccine  One dose is recommended after age 16. Measles, mumps, and rubella (MMR) vaccine  You may need at least one dose of MMR if you were born in 1957 or later. You may also need a second dose. Meningococcal conjugate (MenACWY) vaccine  You may need this if you have certain conditions. Hepatitis A vaccine  You may need this if you have certain conditions or if you travel or work in places where you may be exposed to hepatitis A. Hepatitis B vaccine  You may need this if you have certain conditions or if you travel or work in places where you may be exposed to hepatitis B. Haemophilus influenzae type b (Hib) vaccine  You may need this if you have certain conditions. You may receive vaccines as individual doses or  as more than one vaccine together in one shot (combination vaccines). Talk with your health care provider about the risks and benefits of combination vaccines. What tests do I need? Blood tests  Lipid and cholesterol levels. These may be checked every 5 years, or more frequently depending on your overall health.  Hepatitis C test.  Hepatitis B test. Screening  Lung cancer screening. You may have this screening  every year starting at age 42 if you have a 30-pack-year history of smoking and currently smoke or have quit within the past 15 years.  Colorectal cancer screening. All adults should have this screening starting at age 32 and continuing until age 11. Your health care provider may recommend screening at age 44 if you are at increased risk. You will have tests every 1-10 years, depending on your results and the type of screening test.  Prostate cancer screening. Recommendations will vary depending on your family history and other risks.  Diabetes screening. This is done by checking your blood sugar (glucose) after you have not eaten for a while (fasting). You may have this done every 1-3 years.  Abdominal aortic aneurysm (AAA) screening. You may need this if you are a current or former smoker.  Sexually transmitted disease (STD) testing. Follow these instructions at home: Eating and drinking  Eat a diet that includes fresh fruits and vegetables, whole grains, lean protein, and low-fat dairy products. Limit your intake of foods with high amounts of sugar, saturated fats, and salt.  Take vitamin and mineral supplements as recommended by your health care provider.  Do not drink alcohol if your health care provider tells you not to drink.  If you drink alcohol: ? Limit how much you have to 0-2 drinks a day. ? Be aware of how much alcohol is in your drink. In the [place] one drink equals one 12 oz bottle of beer (355 mL), one 5 oz glass of wine (148 mL), or one 1 oz glass of hard liquor (44 mL). Lifestyle  Take daily care of your teeth and gums.  Stay active. Exercise for at least 30 minutes on 5 or more days each week.  Do not use any products that contain nicotine or tobacco, such as cigarettes, e-cigarettes, and chewing tobacco. If you need help quitting, ask your health care provider.  If you are sexually active, practice safe sex. Use a condom or other form of protection to prevent STIs  (sexually transmitted infections).  Talk with your health care provider about taking a low-dose aspirin or statin. What's next?  Visit your health care provider once a year for a well check visit.  Ask your health care provider how often you should have your eyes and teeth checked.  Stay up to date on all vaccines. This information is not intended to replace advice given to you by your health care provider. Make sure you discuss any questions you have with your health care provider. Document Revised: 01/07/2018 Document Reviewed: 01/07/2018 Elsevier Patient Education  2020 [employer].

## 2019-08-31 ENCOUNTER — Other Ambulatory Visit: Payer: Self-pay

## 2019-08-31 ENCOUNTER — Encounter: Payer: Self-pay | Admitting: Internal Medicine

## 2019-08-31 MED ORDER — TAMSULOSIN HCL 0.4 MG PO CAPS: 0.4000 mg | ORAL_CAPSULE | ORAL | 0 refills | Status: DC

## 2019-09-06 ENCOUNTER — Other Ambulatory Visit: Payer: Self-pay | Admitting: Internal Medicine

## 2019-10-10 ENCOUNTER — Other Ambulatory Visit: Payer: Self-pay | Admitting: Internal Medicine

## 2019-10-18 DIAGNOSIS — Z23 Encounter for immunization: Secondary | ICD-10-CM | POA: Diagnosis not present

## 2019-10-21 ENCOUNTER — Encounter: Payer: Self-pay | Admitting: Internal Medicine

## 2019-10-24 DIAGNOSIS — Z23 Encounter for immunization: Secondary | ICD-10-CM | POA: Diagnosis not present

## 2019-10-25 DIAGNOSIS — D225 Melanocytic nevi of trunk: Secondary | ICD-10-CM | POA: Diagnosis not present

## 2019-10-25 DIAGNOSIS — L570 Actinic keratosis: Secondary | ICD-10-CM | POA: Diagnosis not present

## 2019-10-25 DIAGNOSIS — L821 Other seborrheic keratosis: Secondary | ICD-10-CM | POA: Diagnosis not present

## 2019-10-25 DIAGNOSIS — D2262 Melanocytic nevi of left upper limb, including shoulder: Secondary | ICD-10-CM | POA: Diagnosis not present

## 2019-10-25 DIAGNOSIS — L918 Other hypertrophic disorders of the skin: Secondary | ICD-10-CM | POA: Diagnosis not present

## 2019-10-25 DIAGNOSIS — D2261 Melanocytic nevi of right upper limb, including shoulder: Secondary | ICD-10-CM | POA: Diagnosis not present

## 2019-11-16 DIAGNOSIS — N401 Enlarged prostate with lower urinary tract symptoms: Secondary | ICD-10-CM | POA: Diagnosis not present

## 2019-11-17 DIAGNOSIS — H33311 Horseshoe tear of retina without detachment, right eye: Secondary | ICD-10-CM | POA: Diagnosis not present

## 2019-11-17 DIAGNOSIS — H35371 Puckering of macula, right eye: Secondary | ICD-10-CM | POA: Diagnosis not present

## 2019-11-22 DIAGNOSIS — N400 Enlarged prostate without lower urinary tract symptoms: Secondary | ICD-10-CM | POA: Diagnosis not present

## 2019-11-22 DIAGNOSIS — R972 Elevated prostate specific antigen [PSA]: Secondary | ICD-10-CM | POA: Diagnosis not present

## 2019-11-25 ENCOUNTER — Other Ambulatory Visit: Payer: Self-pay | Admitting: Internal Medicine

## 2019-12-06 ENCOUNTER — Telehealth: Payer: Self-pay | Admitting: Internal Medicine

## 2019-12-06 NOTE — Telephone Encounter (Signed)
Caller name: [facility] Laurence Call back number: [phone] ext [phone]  Patient insurance does not covered Rabeprazole.  Alternative are 1-omeprazole dr Theora Master 2-pantoprazole sodium dr tab

## 2019-12-06 NOTE — Telephone Encounter (Signed)
Will initiated PA as he has already tried and failed those.   PA initiated via Covermymeds; KEY:  [id number]. PA approved.   CaseId:[id number];Status:Approved;Review Type:Prior Auth;Coverage Start Date:11/06/2019;Coverage End Date:12/05/2020;  Spoke w/ [facility] informed of PA approval.

## 2020-01-30 ENCOUNTER — Encounter: Payer: Self-pay | Admitting: Internal Medicine

## 2020-01-30 ENCOUNTER — Ambulatory Visit (INDEPENDENT_AMBULATORY_CARE_PROVIDER_SITE_OTHER): Payer: Medicare Other | Admitting: Internal Medicine

## 2020-01-30 ENCOUNTER — Other Ambulatory Visit: Payer: Self-pay

## 2020-01-30 VITALS — BP 128/78 | HR 70 | Temp 97.6°F | Resp 16 | Ht 66.0 in | Wt 175.4 lb

## 2020-01-30 DIAGNOSIS — E785 Hyperlipidemia, unspecified: Secondary | ICD-10-CM

## 2020-01-30 DIAGNOSIS — K219 Gastro-esophageal reflux disease without esophagitis: Secondary | ICD-10-CM | POA: Diagnosis not present

## 2020-01-30 LAB — CBC WITH DIFFERENTIAL/PLATELET
Basophils Absolute: 0 10*3/uL (ref 0.0–0.1)
Basophils Relative: 0.3 % (ref 0.0–3.0)
Eosinophils Absolute: 0.1 10*3/uL (ref 0.0–0.7)
Eosinophils Relative: 1.6 % (ref 0.0–5.0)
HCT: 48.3 % (ref 39.0–52.0)
Hemoglobin: 16.3 g/dL (ref 13.0–17.0)
Lymphocytes Relative: 29.8 % (ref 12.0–46.0)
Lymphs Abs: 1.7 10*3/uL (ref 0.7–4.0)
MCHC: 33.7 g/dL (ref 30.0–36.0)
MCV: 94.2 fl (ref 78.0–100.0)
Monocytes Absolute: 0.5 10*3/uL (ref 0.1–1.0)
Monocytes Relative: 8.3 % (ref 3.0–12.0)
Neutro Abs: 3.4 10*3/uL (ref 1.4–7.7)
Neutrophils Relative %: 60 % (ref 43.0–77.0)
Platelets: 199 10*3/uL (ref 150.0–400.0)
RBC: 5.13 Mil/uL (ref 4.22–5.81)
RDW: 13.7 % (ref 11.5–15.5)
WBC: 5.7 10*3/uL (ref 4.0–10.5)

## 2020-01-30 LAB — COMPREHENSIVE METABOLIC PANEL
ALT: 31 U/L (ref 0–53)
AST: 19 U/L (ref 0–37)
Albumin: 4.4 g/dL (ref 3.5–5.2)
Alkaline Phosphatase: 50 U/L (ref 39–117)
BUN: 14 mg/dL (ref 6–23)
CO2: 31 mEq/L (ref 19–32)
Calcium: 9.8 mg/dL (ref 8.4–10.5)
Chloride: 103 mEq/L (ref 96–112)
Creatinine, Ser: 0.93 mg/dL (ref 0.40–1.50)
GFR: 80.78 mL/min (ref >60.00)
Glucose, Bld: 93 mg/dL (ref 70–99)
Potassium: 4.4 mEq/L (ref 3.5–5.1)
Sodium: 139 mEq/L (ref 135–145)
Total Bilirubin: 0.9 mg/dL (ref 0.2–1.2)
Total Protein: 6.7 g/dL (ref 6.0–8.3)

## 2020-01-30 LAB — LIPID PANEL
Cholesterol: 170 mg/dL (ref 0–200)
HDL: 58.9 mg/dL (ref >39.00)
LDL Cholesterol: 95 mg/dL (ref 0–99)
NonHDL: 111.06
Total CHOL/HDL Ratio: 3
Triglycerides: 78 mg/dL (ref 0.0–149.0)
VLDL: 15.6 mg/dL (ref 0.0–40.0)

## 2020-01-30 LAB — TSH: TSH: 3.64 u[IU]/mL (ref 0.35–4.50)

## 2020-01-30 MED ORDER — RABEPRAZOLE SODIUM 20 MG PO TBEC
20.0000 mg | DELAYED_RELEASE_TABLET | Freq: Every day | ORAL | 3 refills | Status: DC
Start: 2020-01-30 — End: 2020-02-06

## 2020-01-30 NOTE — Progress Notes (Signed)
Pre visit review using our clinic review tool, if applicable. No additional management support is needed unless otherwise documented below in the visit note. 

## 2020-01-30 NOTE — Assessment & Plan Note (Signed)
-  Td 2015 - s/p had a zostavax & shingrix - PNM 23:  2012; prevnar 2015 - s/p covid vax x 3  had a  Flu shot -CCS:  Colonoscopy:  , 06/27/2005 , 09-2010---->  1 polyp, cscope 11-2015, neg, 5 years d/t FH -Prostate cancer screening:   PSA varies, to see urology in a couple of weeks

## 2020-01-30 NOTE — Progress Notes (Signed)
Subjective:    Patient ID: Rick Campbell, male    DOB: 05-25-1945, 75 y.o.   MRN: [id number]  DOS:  01/30/2020 Type of visit - description: Routine visit Since the last office visit he is doing well. Gained a significant amount of weight but in the last few months has lost 15 pounds by exercising and eating healthier.  GERD symptoms not completely well controlled, occasional indigestion, flatus. Some growling sounds from the abdomen.  Denies nausea or vomiting.  No abdominal pain per se.  No blood in the stools. No dysphagia or odynophagia.  No dysuria or gross hematuria.   Wt Readings from Last 3 Encounters:  01/30/20 175 lb 6 oz (79.5 kg)  07/20/18 178 lb 4 oz (80.9 kg)  11/17/17 174 lb 4 oz (79 kg)     Review of Systems See above   Past Medical History:  Diagnosis Date  . Adenomatous polyp of colon   . Allergy    seasonal  . Cataract   . Chronic headache   . Esophageal reflux   . Hyperlipidemia   . Lower urinary tract symptoms (LUTS)     Past Surgical History:  Procedure Laterality Date  . CHOLECYSTECTOMY  1998  . EYE SURGERY Bilateral 11/27/2017   cataract and retina surgery    Allergies as of 01/30/2020      Reactions   Sulfonamide Derivatives Hives, Swelling   Urticaria      Medication List       Accurate as of January 30, 2020  8:36 PM. If you have any questions, ask your nurse or doctor.        STOP taking these medications   tamsulosin 0.4 MG Caps capsule Commonly known as: FLOMAX Stopped by: Kathlene November, MD     TAKE these medications   atorvastatin 10 MG tablet Commonly known as: LIPITOR TAKE 1 TABLET DAILY AT 6 P.M.   RABEprazole 20 MG tablet Commonly known as: ACIPHEX Take 1 tablet (20 mg total) by mouth daily. What changed: when to take this Changed by: Kathlene November, MD   SENIOR MULTIVITAMIN PLUS PO Take 1 tablet by mouth daily.          Objective:   Physical Exam BP 128/78 (BP Location: Left Arm, Patient Position: Sitting,  Cuff Size: Small)   Pulse 70   Temp 97.6 F (36.4 C) (Oral)   Resp 16   Ht 5\' 6"  (1.676 m)   Wt 175 lb 6 oz (79.5 kg)   SpO2 99%   BMI 28.31 kg/m  General: Well developed, NAD, BMI noted Neck: No  thyromegaly  HEENT:  Normocephalic . Face symmetric, atraumatic Lungs:  CTA B Normal respiratory effort, no intercostal retractions, no accessory muscle use. Heart: RRR,  no murmur.  Abdomen:  Not distended, soft, non-tender. No rebound or rigidity.   Lower extremities: no pretibial edema bilaterally  Skin: Exposed areas without rash. Not pale. Not jaundice Neurologic:  alert & oriented X3.  Speech normal, gait appropriate for age and unassisted Strength symmetric and appropriate for age.  Psych: Cognition and judgment appear intact.  Cooperative with normal attention span and concentration.  Behavior appropriate. No anxious or depressed appearing.     Assessment    Assessment Hyperlipidemia GERD Seasonal allergies Obesity H/o increased PSA , LUTS, sees urology Headaches, chronic  PLAN: Hyperlipidemia: On Lipitor, checking labs.  Last LDL slightly elevated. GERD: Not completely well controlled with PPIs every other day, increase to 1 p.o. daily, watch diet,  take probiotics, call if not back to normal soon. Obesity, history of, doing great, has lost 15 pounds in the last few months. Preventive care reviewed RTC 1 year  This visit occurred during the SARS-CoV-2 public health emergency.  Safety protocols were in place, including screening questions prior to the visit, additional usage of staff PPE, and extensive cleaning of exam room while observing appropriate contact time as indicated for disinfecting solutions.

## 2020-01-30 NOTE — Patient Instructions (Addendum)
Check the  blood pressure   monthly   BP GOAL is between 110/65 and  135/85. If it is consistently higher or lower, let me know  Go back on acid medicine daily Take a probiotic OTC (Align) x few weeks  If no back to normal , let me know    GO TO THE LAB : Get the blood work     GO TO THE FRONT DESK, PLEASE SCHEDULE YOUR APPOINTMENTS Come back for a check up in 1 year

## 2020-01-30 NOTE — Assessment & Plan Note (Signed)
Hyperlipidemia: On Lipitor, checking labs.  Last LDL slightly elevated. GERD: Not completely well controlled with PPIs every other day, increase to 1 p.o. daily, watch diet, take probiotics, call if not back to normal soon. Obesity, history of, doing great, has lost 15 pounds in the last few months. Preventive care reviewed RTC 1 year

## 2020-02-04 ENCOUNTER — Encounter: Payer: Self-pay | Admitting: Internal Medicine

## 2020-02-06 MED ORDER — RABEPRAZOLE SODIUM 20 MG PO TBEC
20.0000 mg | DELAYED_RELEASE_TABLET | Freq: Every day | ORAL | 3 refills | Status: DC
Start: 2020-02-06 — End: 2021-05-30

## 2020-02-06 MED ORDER — ATORVASTATIN CALCIUM 10 MG PO TABS
ORAL_TABLET | ORAL | 3 refills | Status: DC
Start: 2020-02-06 — End: 2020-12-24

## 2020-02-08 DIAGNOSIS — R972 Elevated prostate specific antigen [PSA]: Secondary | ICD-10-CM | POA: Diagnosis not present

## 2020-02-08 LAB — PSA: PSA: 4.24

## 2020-02-15 DIAGNOSIS — R972 Elevated prostate specific antigen [PSA]: Secondary | ICD-10-CM | POA: Diagnosis not present

## 2020-02-15 DIAGNOSIS — R3912 Poor urinary stream: Secondary | ICD-10-CM | POA: Diagnosis not present

## 2020-02-15 DIAGNOSIS — N401 Enlarged prostate with lower urinary tract symptoms: Secondary | ICD-10-CM | POA: Diagnosis not present

## 2020-04-30 ENCOUNTER — Encounter: Payer: Self-pay | Admitting: Internal Medicine

## 2020-05-03 ENCOUNTER — Encounter: Payer: Self-pay | Admitting: Internal Medicine

## 2020-05-24 DIAGNOSIS — N4283 Cyst of prostate: Secondary | ICD-10-CM | POA: Diagnosis not present

## 2020-05-24 DIAGNOSIS — R3912 Poor urinary stream: Secondary | ICD-10-CM | POA: Diagnosis not present

## 2020-05-24 DIAGNOSIS — R972 Elevated prostate specific antigen [PSA]: Secondary | ICD-10-CM | POA: Diagnosis not present

## 2020-05-24 DIAGNOSIS — N401 Enlarged prostate with lower urinary tract symptoms: Secondary | ICD-10-CM | POA: Diagnosis not present

## 2020-05-28 DIAGNOSIS — Z23 Encounter for immunization: Secondary | ICD-10-CM | POA: Diagnosis not present

## 2020-05-29 ENCOUNTER — Other Ambulatory Visit: Payer: Self-pay | Admitting: Urology

## 2020-05-29 DIAGNOSIS — N4283 Cyst of prostate: Secondary | ICD-10-CM

## 2020-06-07 ENCOUNTER — Telehealth (INDEPENDENT_AMBULATORY_CARE_PROVIDER_SITE_OTHER): Payer: Medicare Other | Admitting: Family Medicine

## 2020-06-07 ENCOUNTER — Encounter: Payer: Self-pay | Admitting: Family Medicine

## 2020-06-07 ENCOUNTER — Encounter: Payer: Self-pay | Admitting: Internal Medicine

## 2020-06-07 ENCOUNTER — Other Ambulatory Visit: Payer: Self-pay

## 2020-06-07 ENCOUNTER — Other Ambulatory Visit (HOSPITAL_BASED_OUTPATIENT_CLINIC_OR_DEPARTMENT_OTHER): Payer: Self-pay

## 2020-06-07 DIAGNOSIS — U071 COVID-19: Secondary | ICD-10-CM | POA: Insufficient documentation

## 2020-06-07 MED ORDER — PAXLOVID 20 X 150 MG & 10 X 100MG PO TBPK
3.0000 | ORAL_TABLET | Freq: Two times a day (BID) | ORAL | 0 refills | Status: DC
  Filled 2020-06-07: qty 30, 5d supply, fill #0

## 2020-06-07 NOTE — Assessment & Plan Note (Signed)
paxlovid sent to pharmacy Can con't with mucinex / tylenol  Call office if symptoms worsen or do not improve

## 2020-06-07 NOTE — Progress Notes (Addendum)
MyChart Video Visit    Virtual Visit via Video Note   This visit type was conducted due to national recommendations for restrictions regarding the COVID-19 Pandemic (e.g. social distancing) in an effort to limit this patient's exposure and mitigate transmission in our community. This patient is at least at moderate risk for complications without adequate follow up. This format is felt to be most appropriate for this patient at this time. Physical exam was limited by quality of the video and audio technology used for the visit. [name] was able to get the patient set up on a video visit.  Patient location: home with wife  Patient and provider in visit Provider location: home I discussed the limitations of evaluation and management by telemedicine and the availability of in person appointments. The patient expressed understanding and agreed to proceed.  Visit Date: 06/07/2020  Today's healthcare provider: Ann Held, Rick Campbell     Subjective:    Patient ID: Rick Campbell, Rick Campbell    DOB: 03-Sep-1945, 75 y.o.   MRN: [id number]  Chief Complaint  Patient presents with  . Covid Positive    HPI Patient is in today for + covid.  His wife tested + 5 days ago.   Pt has sinus congestion , no fever    His symptoms started end of last week.  ----  He tested Tuesday and was neg.  He tested positive today.   He is taking mucinex and tylenol.  He has had all 4 covid vaccines.   He really does not feel very bad.  He just thought he had his regular sinus symptoms but he tested again because his wife was positive.   Past Medical History:  Diagnosis Date  . Adenomatous polyp of colon   . Allergy    seasonal  . Cataract   . Chronic headache   . Esophageal reflux   . Hyperlipidemia   . Lower urinary tract symptoms (LUTS)     Past Surgical History:  Procedure Laterality Date  . CHOLECYSTECTOMY  1998  . EYE SURGERY Bilateral 11/27/2017   cataract and retina surgery    Family  History  Problem Relation Age of Onset  . Colon cancer Father        dx age 65s  . Diabetes Father   . Heart Problems Father        father CHF in his 76s  . Lung cancer Mother        was a heavy smoker  . COPD Neg Hx   . Arthritis Neg Hx   . Prostate cancer Neg Hx     Social History   Socioeconomic History  . Marital status: Married    Spouse name: Not on file  . Number of children: 2  . Years of education: Not on file  . Highest education level: Not on file  Occupational History  . Occupation: retired- Education administrator    Comment: Marketing  Tobacco Use  . Smoking status: Never Smoker  . Smokeless tobacco: Never Used  Substance and Sexual Activity  . Alcohol use: Yes    Comment: 1 or 2 glasses of wine every week  . Drug use: No  . Sexual activity: Yes  Other Topics Concern  . Not on file  Social History Narrative   retired from [employer]  , lives w/ wife , both children in [place] Strain: [place]   .  Difficulty of Paying Living Expenses: Not hard at all  Food Insecurity: No Food Insecurity  . Worried About Charity fundraiser in the Last Year: Never true  . Ran Out of Food in the Last Year: Never true  Transportation Needs: No Transportation Needs  . Lack of Transportation (Medical): No  . Lack of Transportation (Non-Medical): No  Physical Activity: Not on file  Stress: Not on file  Social Connections: Not on file  Intimate Partner Violence: Not on file    Outpatient Medications Prior to Visit  Medication Sig Dispense Refill  . atorvastatin (LIPITOR) 10 MG tablet TAKE 1 TABLET DAILY AT 6 P.M. 90 tablet 3  . Multiple Vitamins-Minerals (SENIOR MULTIVITAMIN PLUS PO) Take 1 tablet by mouth daily.    . RABEprazole (ACIPHEX) 20 MG tablet Take 1 tablet (20 mg total) by mouth daily. 90 tablet 3   No facility-administered medications prior to visit.    Allergies  Allergen Reactions  . Sulfonamide Derivatives  Hives and Swelling    Urticaria    Review of Systems  Constitutional: Negative for chills and fever.  HENT: Negative for congestion, ear pain, nosebleeds, sinus pain and sore throat.   Respiratory: Positive for cough.   Musculoskeletal: Negative for myalgias.       Objective:    Physical Exam Vitals reviewed.  Pulmonary:     Effort: Pulmonary effort is normal.  Psychiatric:        Mood and Affect: Mood normal.        Behavior: Behavior normal.        Thought Content: Thought content normal.        Judgment: Judgment normal.   There were no vitals filed for this visit.   There were no vitals taken for this visit. Wt Readings from Last 3 Encounters:  01/30/20 175 lb 6 oz (79.5 kg)  07/20/18 178 lb 4 oz (80.9 kg)  11/17/17 174 lb 4 oz (79 kg)    Diabetic Foot Exam - Simple   No data filed    Lab Results  Component Value Date   WBC 5.7 01/30/2020   HGB 16.3 01/30/2020   HCT 48.3 01/30/2020   PLT 199.0 01/30/2020   GLUCOSE 93 01/30/2020   CHOL 170 01/30/2020   TRIG 78.0 01/30/2020   HDL 58.90 01/30/2020   LDLDIRECT 154.6 07/11/2011   LDLCALC 95 01/30/2020   ALT 31 01/30/2020   AST 19 01/30/2020   NA 139 01/30/2020   K 4.4 01/30/2020   CL 103 01/30/2020   CREATININE 0.93 01/30/2020   BUN 14 01/30/2020   CO2 31 01/30/2020   TSH 3.64 01/30/2020   PSA 4.24 02/08/2020   HGBA1C 5.3 05/17/2014    Lab Results  Component Value Date   TSH 3.64 01/30/2020   Lab Results  Component Value Date   WBC 5.7 01/30/2020   HGB 16.3 01/30/2020   HCT 48.3 01/30/2020   MCV 94.2 01/30/2020   PLT 199.0 01/30/2020   Lab Results  Component Value Date   NA 139 01/30/2020   K 4.4 01/30/2020   CO2 31 01/30/2020   GLUCOSE 93 01/30/2020   BUN 14 01/30/2020   CREATININE 0.93 01/30/2020   BILITOT 0.9 01/30/2020   ALKPHOS 50 01/30/2020   AST 19 01/30/2020   ALT 31 01/30/2020   PROT 6.7 01/30/2020   ALBUMIN 4.4 01/30/2020   CALCIUM 9.8 01/30/2020   GFR 80.78 01/30/2020    Lab Results  Component Value Date   CHOL  170 01/30/2020   Lab Results  Component Value Date   HDL 58.90 01/30/2020   Lab Results  Component Value Date   LDLCALC 95 01/30/2020   Lab Results  Component Value Date   TRIG 78.0 01/30/2020   Lab Results  Component Value Date   CHOLHDL 3 01/30/2020   Lab Results  Component Value Date   HGBA1C 5.3 05/17/2014       Assessment & Plan:   Problem List Items Addressed This Visit      Unprioritized   COVID-19 - Primary    paxlovid sent to pharmacy Can con't with mucinex / tylenol  Call office if symptoms worsen or Rick Campbell not improve      Relevant Medications   Nirmatrelvir & Ritonavir (PAXLOVID) 20 x 150 MG & 10 x 100MG  TBPK      I am having Levada Dy A. Honor start on Paxlovid. I am also having him maintain his Multiple Vitamins-Minerals (SENIOR MULTIVITAMIN PLUS PO), RABEprazole, and atorvastatin.  Meds ordered this encounter  Medications  . Nirmatrelvir & Ritonavir (PAXLOVID) 20 x 150 MG & 10 x 100MG  TBPK    Sig: Take 3 tablets by mouth 2 (two) times daily.    Dispense:  30 tablet    Refill:  0    I discussed the assessment and treatment plan with the patient. The patient was provided an opportunity to ask questions and all were answered. The patient agreed with the plan and demonstrated an understanding of the instructions.   The patient was advised to call back or seek an in-person evaluation if the symptoms worsen or if the condition fails to improve as anticipated.    Rick Held, Rick Campbell [place] at [facility] [phone] (phone) [phone] (fax)  [place]

## 2020-06-15 ENCOUNTER — Ambulatory Visit
Admission: RE | Admit: 2020-06-15 | Discharge: 2020-06-15 | Disposition: A | Discharge status: Home / Self Care | Payer: Medicare Other | Source: Ambulatory Visit | Attending: Urology | Admitting: Urology

## 2020-06-15 ENCOUNTER — Other Ambulatory Visit: Payer: Self-pay

## 2020-06-15 DIAGNOSIS — N4283 Cyst of prostate: Secondary | ICD-10-CM

## 2020-06-15 DIAGNOSIS — R972 Elevated prostate specific antigen [PSA]: Secondary | ICD-10-CM | POA: Diagnosis not present

## 2020-06-15 DIAGNOSIS — N401 Enlarged prostate with lower urinary tract symptoms: Secondary | ICD-10-CM | POA: Diagnosis not present

## 2020-06-15 MED ORDER — GADOBENATE DIMEGLUMINE 529 MG/ML IV SOLN
15.0000 mL | Freq: Once | INTRAVENOUS | Status: AC | PRN
  Administered 2020-06-15: 15 mL via INTRAVENOUS

## 2020-07-06 NOTE — Progress Notes (Signed)
Subjective:   Rick Campbell is a 75 y.o. male who presents for Medicare Annual/Subsequent preventive examination.  Review of Systems     Cardiac Risk Factors include: advanced age (>45men, >20 women);male gender;dyslipidemia     Objective:    Today's Vitals   07/09/20 0813  BP: 136/72  Pulse: 66  Resp: 16  Temp: 97.7 F (36.5 C)  TempSrc: Temporal  SpO2: 98%  Weight: 182 lb (82.6 kg)  Height: 5\' 6"  (1.676 m)   Body mass index is 29.38 kg/m.  Advanced Directives 07/09/2020 07/05/2019 07/02/2018 06/30/2017  Does Patient Have a Medical Advance Directive? Yes Yes Yes Yes  Type of Paramedic of [place];Living will [place];Living will [place];Living will [place];Living will  Does patient want to make changes to medical advance directive? - No - Patient declined No - Patient declined -  Copy of [place] in Chart? No - copy requested No - copy requested No - copy requested No - copy requested    Current Medications (verified) Outpatient Encounter Medications as of 07/09/2020  Medication Sig   atorvastatin (LIPITOR) 10 MG tablet TAKE 1 TABLET DAILY AT 6 P.M.   Multiple Vitamins-Minerals (SENIOR MULTIVITAMIN PLUS PO) Take 1 tablet by mouth daily.   RABEprazole (ACIPHEX) 20 MG tablet Take 1 tablet (20 mg total) by mouth daily.   tamsulosin (FLOMAX) 0.4 MG CAPS capsule    [DISCONTINUED] Nirmatrelvir & Ritonavir (PAXLOVID) 20 x 150 MG & 10 x 100MG  TBPK Take 3 tablets by mouth 2 (two) times daily. (Patient not taking: Reported on 07/09/2020)   No facility-administered encounter medications on file as of 07/09/2020.    Allergies (verified) Sulfonamide derivatives   History: Past Medical History:  Diagnosis Date   Adenomatous polyp of colon    Allergy    seasonal   Cataract    Chronic headache    Esophageal reflux    Hyperlipidemia    Lower urinary tract symptoms (LUTS)     Past Surgical History:  Procedure Laterality Date   CHOLECYSTECTOMY  1998   EYE SURGERY Bilateral 11/27/2017   cataract and retina surgery   Family History  Problem Relation Age of Onset   Colon cancer Father        dx age 33s   Diabetes Father    Heart Problems Father        father CHF in his 55s   Lung cancer Mother        was a heavy smoker   COPD Neg Hx    Arthritis Neg Hx    Prostate cancer Neg Hx    Social History   Socioeconomic History   Marital status: Married    Spouse name: Not on file   Number of children: 2   Years of education: Not on file   Highest education level: Not on file  Occupational History   Occupation: retired- Education administrator    Comment: Marketing  Tobacco Use   Smoking status: Never   Smokeless tobacco: Never  Substance and Sexual Activity   Alcohol use: Yes    Comment: 1 or 2 glasses of wine every week   Drug use: No   Sexual activity: Yes  Other Topics Concern   Not on file  Social History Narrative   retired from [employer]  , lives w/ wife , both children in [place]  Financial Resource Strain: Low Risk    Difficulty of Paying Living Expenses: Not hard at all  Food Insecurity: No Food Insecurity   Worried About Charity fundraiser in the Last Year: Never true   Ran Out of Food in the Last Year: Never true  Transportation Needs: No Transportation Needs   Lack of Transportation (Medical): No   Lack of Transportation (Non-Medical): No  Physical Activity: Sufficiently Active   Days of Exercise per Week: 4 days   Minutes of Exercise per Session: 60 min  Stress: No Stress Concern Present   Feeling of Stress : Not at all  Social Connections: Socially Integrated   Frequency of Communication with Friends and Family: More than three times a week   Frequency of Social Gatherings with Friends and Family: More than three times a week   Attends Religious Services: More than 4 times per year   Active Member of  [employer] or Organizations: Yes   Attends Music therapist: More than 4 times per year   Marital Status: Married    Tobacco Counseling Counseling given: Not Answered   Clinical Intake:  Pre-visit preparation completed: Yes  Pain : No/denies pain     Nutritional Status: BMI 25 -29 Overweight Nutritional Risks: None Diabetes: No  How often do you need to have someone help you when you read instructions, pamphlets, or other written materials from your doctor or pharmacy?: 1 - Never  Diabetic?No  Interpreter Needed?: No  Information entered by :: Caroleen Hamman LPn   Activities of Daily Living In your present state of health, do you have any difficulty performing the following activities: 07/09/2020  Hearing? N  Vision? N  Difficulty concentrating or making decisions? N  Walking or climbing stairs? N  Dressing or bathing? N  Doing errands, shopping? N  Preparing Food and eating ? N  Using the Toilet? N  In the past six months, have you accidently leaked urine? N  Do you have problems with loss of bowel control? N  Managing your Medications? N  Managing your Finances? N  Housekeeping or managing your Housekeeping? N  Some recent data might be hidden    Patient Care Team: Colon Branch, MD as PCP - Kennon Portela, MD as Consulting Physician (Dermatology) Tanda Rockers, MD as Consulting Physician (Pulmonary Disease) Darleen Crocker, MD as Consulting Physician (Ophthalmology) Festus Aloe, MD as Consulting Physician (Urology)  Indicate any recent Medical Services you may have received from other than [facility] providers in the past year (date may be approximate).     Assessment:   This is a routine wellness examination for Rick Campbell.  Hearing/Vision screen Hearing Screening - Comments:: No hearing loss Vision Screening - Comments:: Reading glasses Last eye exam-09/2019-Dr. Clydene Laming  Dietary issues and exercise activities discussed: Current Exercise  Habits: Home exercise routine, Type of exercise: walking;strength training/weights, Time (Minutes): 60, Frequency (Times/Week): 4, Weekly Exercise (Minutes/Week): 240, Intensity: Mild, Exercise limited by: None identified   Goals Addressed             This Visit's Progress    maintain current healthy lifestyle   On track      Depression Screen PHQ 2/9 Scores 07/09/2020 01/30/2020 07/05/2019 07/02/2018 06/30/2017 11/14/2015 06/29/2015  PHQ - 2 Score 0 0 0 0 0 0 0    Fall Risk Fall Risk  07/09/2020 01/30/2020 07/05/2019 07/02/2018 06/30/2017  Falls in the past year? 0 0 0 0 No  Number falls in past yr: 0  0 0 - -  Injury with Fall? 0 0 0 - -  Follow up Falls prevention discussed - Education provided;Falls prevention discussed - -    FALL RISK PREVENTION PERTAINING TO THE HOME:  Any stairs in or around the home? Yes  If so, are there any without handrails? No  Home free of loose throw rugs in walkways, pet beds, electrical cords, etc? Yes  Adequate lighting in your home to reduce risk of falls? Yes   ASSISTIVE DEVICES UTILIZED TO PREVENT FALLS:  Life alert? No  Use of a cane, walker or w/c? No  Grab bars in the bathroom? No  Shower chair or bench in shower? No  Elevated toilet seat or a handicapped toilet? No   TIMED UP AND GO:  Was the test performed? Yes .  Length of time to ambulate 10 feet: 9 sec.   Gait steady and fast without use of assistive device  Cognitive Function:Normal cognitive status assessed by direct observation by this Nurse Health Advisor. No abnormalities found.          Immunizations Immunization History  Administered Date(s) Administered   Fluad Quad(high Dose 65+) 10/12/2018   Influenza Split 11/26/2010   Influenza, High Dose Seasonal PF 11/07/2014, 11/14/2015, 11/12/2016, 11/17/2017   Influenza,inj,Quad PF,6+ Mos 11/22/2013   Influenza-Unspecified 11/27/2012, 10/17/2019   PFIZER(Purple Top)SARS-COV-2 Vaccination 02/22/2019, 03/15/2019, 10/24/2019,  05/28/2020   Pneumococcal Conjugate-13 08/10/2013   Pneumococcal Polysaccharide-23 06/21/2010   Td 09/15/2003   Tetanus 08/10/2013   Zoster Recombinat (Shingrix) 06/02/2017, 08/06/2017   Zoster, Live 09/10/2009    TDAP status: Up to date  Flu Vaccine status: Up to date  Pneumococcal vaccine status: Up to date  Covid-19 vaccine status: Completed vaccines  Qualifies for Shingles Vaccine? No   Zostavax completed Yes   Shingrix Completed?: Yes  Screening Tests Health Maintenance  Topic Date Due   INFLUENZA VACCINE  08/27/2020   COLONOSCOPY (Pts 45-60yrs Insurance coverage will need to be confirmed)  11/27/2020   TETANUS/TDAP  08/11/2023   COVID-19 Vaccine  Completed   Hepatitis C Screening  Completed   PNA vac Low Risk Adult  Completed   Zoster Vaccines- Shingrix  Completed   HPV VACCINES  Aged Out    Health Maintenance  There are no preventive care reminders to display for this patient.   Colorectal cancer screening: Type of screening: Colonoscopy. Completed 11/28/2015. Repeat every 5 years  Lung Cancer Screening: (Low Dose CT Chest recommended if Age 69-80 years, 30 pack-year currently smoking OR have quit w/in 15years.) does not qualify.    Additional Screening:  Hepatitis C Screening: Completed 11/07/2014  Vision Screening: Recommended annual ophthalmology exams for early detection of glaucoma and other disorders of the eye. Is the patient up to date with their annual eye exam?  Yes  Who is the provider or what is the name of the office in which the patient attends annual eye exams? Dr. Sherral Hammers   Dental Screening: Recommended annual dental exams for proper oral hygiene  Community Resource Referral / Chronic Care Management: CRR required this visit?  No   CCM required this visit?  No      Plan:     I have personally reviewed and noted the following in the patient's chart:   Medical and social history Use of alcohol, tobacco or illicit drugs  Current  medications and supplements including opioid prescriptions. Patient is not currently taking opioid prescriptions. Functional ability and status Nutritional status Physical activity Advanced directives List of  other physicians Hospitalizations, surgeries, and ER visits in previous 12 months Vitals Screenings to include cognitive, depression, and falls Referrals and appointments  In addition, I have reviewed and discussed with patient certain preventive protocols, quality metrics, and best practice recommendations. A written personalized care plan for preventive services as well as general preventive health recommendations were provided to patient.   Patient would like to access avs on mychart.  Marta Antu, LPN   [id number]  Nurse Health Advisor  Nurse Notes: None

## 2020-07-09 ENCOUNTER — Other Ambulatory Visit: Payer: Self-pay

## 2020-07-09 ENCOUNTER — Ambulatory Visit (INDEPENDENT_AMBULATORY_CARE_PROVIDER_SITE_OTHER): Payer: Medicare Other

## 2020-07-09 VITALS — BP 136/72 | HR 66 | Temp 97.7°F | Resp 16 | Ht 66.0 in | Wt 182.0 lb

## 2020-07-09 DIAGNOSIS — Z0000 Encounter for general adult medical examination without abnormal findings: Secondary | ICD-10-CM

## 2020-07-09 NOTE — Patient Instructions (Signed)
Rick Campbell , Thank you for taking time to come for your Medicare Wellness Visit. I appreciate your ongoing commitment to your health goals. Please review the following plan we discussed and let me know if I can assist you in the future.   Screening recommendations/referrals: Colonoscopy: Completed 11/28/2015-Due 11/27/2020 Recommended yearly ophthalmology/optometry visit for glaucoma screening and checkup Recommended yearly dental visit for hygiene and checkup  Vaccinations: Influenza vaccine: Up to date Pneumococcal vaccine: Up to date Tdap vaccine: Up to date-Due 08/11/2023 Shingles vaccine: Completed vaccines   Covid-19: Up to date  Advanced directives: Please bring a copy for your chart  Conditions/risks identified: See problem list  Next appointment: Follow up in one year for your annual wellness visit. 07/15/2021 @ 8:20.  Preventive Care 75 Years and Older, Male Preventive care refers to lifestyle choices and visits with your health care provider that can promote health and wellness. What does preventive care include? A yearly physical exam. This is also called an annual well check. Dental exams once or twice a year. Routine eye exams. Ask your health care provider how often you should have your eyes checked. Personal lifestyle choices, including: Daily care of your teeth and gums. Regular physical activity. Eating a healthy diet. Avoiding tobacco and drug use. Limiting alcohol use. Practicing safe sex. Taking low doses of aspirin every day. Taking vitamin and mineral supplements as recommended by your health care provider. What happens during an annual well check? The services and screenings done by your health care provider during your annual well check will depend on your age, overall health, lifestyle risk factors, and family history of disease. Counseling  Your health care provider may ask you questions about your: Alcohol use. Tobacco use. Drug use. Emotional  well-being. Home and relationship well-being. Sexual activity. Eating habits. History of falls. Memory and ability to understand (cognition). Work and work Statistician. Screening  You may have the following tests or measurements: Height, weight, and BMI. Blood pressure. Lipid and cholesterol levels. These may be checked every 5 years, or more frequently if you are over 47 years old. Skin check. Lung cancer screening. You may have this screening every year starting at age 31 if you have a 30-pack-year history of smoking and currently smoke or have quit within the past 15 years. Fecal occult blood test (FOBT) of the stool. You may have this test every year starting at age 38. Flexible sigmoidoscopy or colonoscopy. You may have a sigmoidoscopy every 5 years or a colonoscopy every 10 years starting at age 10. Prostate cancer screening. Recommendations will vary depending on your family history and other risks. Hepatitis C blood test. Hepatitis B blood test. Sexually transmitted disease (STD) testing. Diabetes screening. This is done by checking your blood sugar (glucose) after you have not eaten for a while (fasting). You may have this done every 1-3 years. Abdominal aortic aneurysm (AAA) screening. You may need this if you are a current or former smoker. Osteoporosis. You may be screened starting at age 25 if you are at high risk. Talk with your health care provider about your test results, treatment options, and if necessary, the need for more tests. Vaccines  Your health care provider may recommend certain vaccines, such as: Influenza vaccine. This is recommended every year. Tetanus, diphtheria, and acellular pertussis (Tdap, Td) vaccine. You may need a Td booster every 10 years. Zoster vaccine. You may need this after age 85. Pneumococcal 13-valent conjugate (PCV13) vaccine. One dose is recommended after age 35. Pneumococcal  polysaccharide (PPSV23) vaccine. One dose is recommended after  age 40. Talk to your health care provider about which screenings and vaccines you need and how often you need them. This information is not intended to replace advice given to you by your health care provider. Make sure you discuss any questions you have with your health care provider. Document Released: 02/09/2015 Document Revised: 10/03/2015 Document Reviewed: 11/14/2014 Elsevier Interactive Patient Education  2017 [place] Prevention in the Home Falls can cause injuries. They can happen to people of all ages. There are many things you can do to make your home safe and to help prevent falls. What can I do on the outside of my home? Regularly fix the edges of walkways and driveways and fix any cracks. Remove anything that might make you trip as you walk through a door, such as a raised step or threshold. Trim any bushes or trees on the path to your home. Use bright outdoor lighting. Clear any walking paths of anything that might make someone trip, such as rocks or tools. Regularly check to see if handrails are loose or broken. Make sure that both sides of any steps have handrails. Any raised decks and porches should have guardrails on the edges. Have any leaves, snow, or ice cleared regularly. Use sand or salt on walking paths during winter. Clean up any spills in your garage right away. This includes oil or grease spills. What can I do in the bathroom? Use night lights. Install grab bars by the toilet and in the tub and shower. Do not use towel bars as grab bars. Use non-skid mats or decals in the tub or shower. If you need to sit down in the shower, use a plastic, non-slip stool. Keep the floor dry. Clean up any water that spills on the floor as soon as it happens. Remove soap buildup in the tub or shower regularly. Attach bath mats securely with double-sided non-slip rug tape. Do not have throw rugs and other things on the floor that can make you trip. What can I do in the  bedroom? Use night lights. Make sure that you have a light by your bed that is easy to reach. Do not use any sheets or blankets that are too big for your bed. They should not hang down onto the floor. Have a firm chair that has side arms. You can use this for support while you get dressed. Do not have throw rugs and other things on the floor that can make you trip. What can I do in the kitchen? Clean up any spills right away. Avoid walking on wet floors. Keep items that you use a lot in easy-to-reach places. If you need to reach something above you, use a strong step stool that has a grab bar. Keep electrical cords out of the way. Do not use floor polish or wax that makes floors slippery. If you must use wax, use non-skid floor wax. Do not have throw rugs and other things on the floor that can make you trip. What can I do with my stairs? Do not leave any items on the stairs. Make sure that there are handrails on both sides of the stairs and use them. Fix handrails that are broken or loose. Make sure that handrails are as long as the stairways. Check any carpeting to make sure that it is firmly attached to the stairs. Fix any carpet that is loose or worn. Avoid having throw rugs at the top  or bottom of the stairs. If you do have throw rugs, attach them to the floor with carpet tape. Make sure that you have a light switch at the top of the stairs and the bottom of the stairs. If you do not have them, ask someone to add them for you. What else can I do to help prevent falls? Wear shoes that: Do not have high heels. Have rubber bottoms. Are comfortable and fit you well. Are closed at the toe. Do not wear sandals. If you use a stepladder: Make sure that it is fully opened. Do not climb a closed stepladder. Make sure that both sides of the stepladder are locked into place. Ask someone to hold it for you, if possible. Clearly mark and make sure that you can see: Any grab bars or  handrails. First and last steps. Where the edge of each step is. Use tools that help you move around (mobility aids) if they are needed. These include: Canes. Walkers. Scooters. Crutches. Turn on the lights when you go into a dark area. Replace any light bulbs as soon as they burn out. Set up your furniture so you have a clear path. Avoid moving your furniture around. If any of your floors are uneven, fix them. If there are any pets around you, be aware of where they are. Review your medicines with your doctor. Some medicines can make you feel dizzy. This can increase your chance of falling. Ask your doctor what other things that you can do to help prevent falls. This information is not intended to replace advice given to you by your health care provider. Make sure you discuss any questions you have with your health care provider. Document Released: 11/09/2008 Document Revised: 06/21/2015 Document Reviewed: 02/17/2014 Elsevier Interactive Patient Education  2017 [employer].

## 2020-07-31 ENCOUNTER — Encounter: Payer: Self-pay | Admitting: Internal Medicine

## 2020-08-03 ENCOUNTER — Encounter: Payer: Self-pay | Admitting: Internal Medicine

## 2020-08-03 ENCOUNTER — Ambulatory Visit (INDEPENDENT_AMBULATORY_CARE_PROVIDER_SITE_OTHER): Payer: Medicare Other | Admitting: Internal Medicine

## 2020-08-03 ENCOUNTER — Other Ambulatory Visit: Payer: Self-pay

## 2020-08-03 VITALS — BP 124/72 | HR 69 | Temp 97.9°F | Resp 16 | Ht 66.0 in | Wt 182.5 lb

## 2020-08-03 DIAGNOSIS — M546 Pain in thoracic spine: Secondary | ICD-10-CM | POA: Diagnosis not present

## 2020-08-03 NOTE — Progress Notes (Signed)
Subjective:    Patient ID: Rick Campbell, male    DOB: November 16, 1945, 75 y.o.   MRN: [id number]  DOS:  08/03/2020 Type of visit - description: Acute visit  8 days ago, developed pain at both shoulders and arms (tricipital area), the discomfort described as weakness, numbness.  Denies any reaping type of pain at the back. It lasted 2 to 3 minutes and went away.  No further symptoms. No associated nausea, vomiting, diaphoresis.  No neck pain. The patient is here today because likes to be sure pain was not heart related.   Symptoms happen it in the context of he working on the yard few hours before.  Since then he is able to take his 40-minute walk 3-4 times a week without any problems.  Review of Systems No recent fever chills No shortness of breath No lower extremity edema or calf pain. No cough Past Medical History:  Diagnosis Date   Adenomatous polyp of colon    Allergy    seasonal   Cataract    Chronic headache    Esophageal reflux    Hyperlipidemia    Lower urinary tract symptoms (LUTS)     Past Surgical History:  Procedure Laterality Date   CHOLECYSTECTOMY  1998   EYE SURGERY Bilateral 11/27/2017   cataract and retina surgery    Allergies as of 08/03/2020       Reactions   Sulfonamide Derivatives Hives, Swelling   Urticaria        Medication List        Accurate as of August 03, 2020 11:59 PM. If you have any questions, ask your nurse or doctor.          atorvastatin 10 MG tablet Commonly known as: LIPITOR TAKE 1 TABLET DAILY AT 6 P.M.   RABEprazole 20 MG tablet Commonly known as: ACIPHEX Take 1 tablet (20 mg total) by mouth daily.   SENIOR MULTIVITAMIN PLUS PO Take 1 tablet by mouth daily.   tamsulosin 0.4 MG Caps capsule Commonly known as: FLOMAX           Objective:   Physical Exam BP 124/72 (BP Location: Left Arm, Patient Position: Sitting, Cuff Size: Normal)   Pulse 69   Temp 97.9 F (36.6 C) (Oral)   Resp 16   Ht 5\' 6"  (1.676 m)    Wt 182 lb 8 oz (82.8 kg)   SpO2 97%   BMI 29.46 kg/m  General:   Well developed, NAD, BMI noted.  HEENT:  Normocephalic . Face symmetric, atraumatic Lungs:  CTA B Normal respiratory effort, no intercostal retractions, no accessory muscle use. Heart: RRR,  no murmur.  Abdomen:  Not distended, soft, non-tender. No rebound or rigidity.   Skin: Not pale. Not jaundice Lower extremities: no pretibial edema bilaterally.  Calves symmetric MSK: No TTP at the thoracic or cervical spine Neurologic:  alert & oriented X3.  Speech normal, gait appropriate for age and unassisted Psych--  Cognition and judgment appear intact.  Cooperative with normal attention span and concentration.  Behavior appropriate. No anxious or depressed appearing.     Assessment      Assessment Hyperlipidemia GERD Seasonal allergies Obesity H/o increased PSA , LUTS, sees urology Headaches, chronic COVID infection, Paxlovid, 05/2020  PLAN Thoracic pain: As described above, unlikely to be heart related, EKG today: NSR. We agreed on simply observation, explained red flag symptoms that should prompt her ER visit.    This visit occurred during the SARS-CoV-2 public health  emergency.  Safety protocols were in place, including screening questions prior to the visit, additional usage of staff PPE, and extensive cleaning of exam room while observing appropriate contact time as indicated for disinfecting solutions.

## 2020-08-04 NOTE — Assessment & Plan Note (Signed)
Thoracic pain: As described above, unlikely to be heart related, EKG today: NSR. We agreed on simply observation, explained red flag symptoms that should prompt her ER visit.

## 2020-09-12 DIAGNOSIS — R972 Elevated prostate specific antigen [PSA]: Secondary | ICD-10-CM | POA: Diagnosis not present

## 2020-09-20 DIAGNOSIS — R3912 Poor urinary stream: Secondary | ICD-10-CM | POA: Diagnosis not present

## 2020-09-20 DIAGNOSIS — R972 Elevated prostate specific antigen [PSA]: Secondary | ICD-10-CM | POA: Diagnosis not present

## 2020-09-20 DIAGNOSIS — N401 Enlarged prostate with lower urinary tract symptoms: Secondary | ICD-10-CM | POA: Diagnosis not present

## 2020-10-31 DIAGNOSIS — Z23 Encounter for immunization: Secondary | ICD-10-CM | POA: Diagnosis not present

## 2020-11-19 ENCOUNTER — Encounter: Payer: Self-pay | Admitting: Gastroenterology

## 2020-11-22 ENCOUNTER — Encounter: Payer: Self-pay | Admitting: Internal Medicine

## 2020-11-29 DIAGNOSIS — H33311 Horseshoe tear of retina without detachment, right eye: Secondary | ICD-10-CM | POA: Diagnosis not present

## 2020-11-29 DIAGNOSIS — Z961 Presence of intraocular lens: Secondary | ICD-10-CM | POA: Diagnosis not present

## 2020-11-29 DIAGNOSIS — H35371 Puckering of macula, right eye: Secondary | ICD-10-CM | POA: Diagnosis not present

## 2020-12-23 ENCOUNTER — Other Ambulatory Visit: Payer: Self-pay | Admitting: Internal Medicine

## 2020-12-24 ENCOUNTER — Encounter: Payer: Self-pay | Admitting: Gastroenterology

## 2020-12-31 DIAGNOSIS — D2262 Melanocytic nevi of left upper limb, including shoulder: Secondary | ICD-10-CM | POA: Diagnosis not present

## 2020-12-31 DIAGNOSIS — L918 Other hypertrophic disorders of the skin: Secondary | ICD-10-CM | POA: Diagnosis not present

## 2020-12-31 DIAGNOSIS — L570 Actinic keratosis: Secondary | ICD-10-CM | POA: Diagnosis not present

## 2020-12-31 DIAGNOSIS — D2272 Melanocytic nevi of left lower limb, including hip: Secondary | ICD-10-CM | POA: Diagnosis not present

## 2020-12-31 DIAGNOSIS — L821 Other seborrheic keratosis: Secondary | ICD-10-CM | POA: Diagnosis not present

## 2020-12-31 DIAGNOSIS — D2261 Melanocytic nevi of right upper limb, including shoulder: Secondary | ICD-10-CM | POA: Diagnosis not present

## 2020-12-31 DIAGNOSIS — D225 Melanocytic nevi of trunk: Secondary | ICD-10-CM | POA: Diagnosis not present

## 2021-01-30 ENCOUNTER — Encounter: Payer: Self-pay | Admitting: Internal Medicine

## 2021-01-30 ENCOUNTER — Ambulatory Visit (INDEPENDENT_AMBULATORY_CARE_PROVIDER_SITE_OTHER): Payer: Medicare Other | Admitting: Internal Medicine

## 2021-01-30 VITALS — BP 122/76 | HR 80 | Temp 97.9°F | Resp 16 | Ht 66.0 in | Wt 188.2 lb

## 2021-01-30 DIAGNOSIS — Z23 Encounter for immunization: Secondary | ICD-10-CM | POA: Diagnosis not present

## 2021-01-30 DIAGNOSIS — E785 Hyperlipidemia, unspecified: Secondary | ICD-10-CM | POA: Diagnosis not present

## 2021-01-30 DIAGNOSIS — K219 Gastro-esophageal reflux disease without esophagitis: Secondary | ICD-10-CM | POA: Diagnosis not present

## 2021-01-30 DIAGNOSIS — R972 Elevated prostate specific antigen [PSA]: Secondary | ICD-10-CM | POA: Diagnosis not present

## 2021-01-30 LAB — LIPID PANEL
Cholesterol: 170 mg/dL (ref 0–200)
HDL: 50.7 mg/dL (ref >39.00)
LDL Cholesterol: 100 mg/dL — ABNORMAL HIGH (ref 0–99)
NonHDL: 119.32
Total CHOL/HDL Ratio: 3
Triglycerides: 99 mg/dL (ref 0.0–149.0)
VLDL: 19.8 mg/dL (ref 0.0–40.0)

## 2021-01-30 LAB — COMPREHENSIVE METABOLIC PANEL
ALT: 35 U/L (ref 0–53)
AST: 23 U/L (ref 0–37)
Albumin: 4.1 g/dL (ref 3.5–5.2)
Alkaline Phosphatase: 55 U/L (ref 39–117)
BUN: 14 mg/dL (ref 6–23)
CO2: 31 mEq/L (ref 19–32)
Calcium: 9.1 mg/dL (ref 8.4–10.5)
Chloride: 103 mEq/L (ref 96–112)
Creatinine, Ser: 0.89 mg/dL (ref 0.40–1.50)
GFR: 83.72 mL/min (ref >60.00)
Glucose, Bld: 89 mg/dL (ref 70–99)
Potassium: 4.1 mEq/L (ref 3.5–5.1)
Sodium: 141 mEq/L (ref 135–145)
Total Bilirubin: 0.6 mg/dL (ref 0.2–1.2)
Total Protein: 6.7 g/dL (ref 6.0–8.3)

## 2021-01-30 LAB — TSH: TSH: 1.97 u[IU]/mL (ref 0.35–5.50)

## 2021-01-30 NOTE — Assessment & Plan Note (Signed)
Preventive care reviewed -Td 2015 - s/p had a zostavax & shingrix - PNM 23:  2012; prevnar 2015; PNM 20 today -  covid vax: got the bivalent vax already per pt, thus UTD  - had a  Flu shot -CCS:  Colonoscopy:  , 06/27/2005 , 09-2010---->  1 polyp, cscope 11-2015, neg, already scheduled for his next colonoscopy -Prostate cancer screening:  PSA varies, last: 4.6 (August 2022 per K PN).  Saw Urology [phone].  At the time they ordered prostate MRI -Advance care planning info provided

## 2021-01-30 NOTE — Progress Notes (Signed)
Subjective:    Patient ID: Rick Campbell, male    DOB: [birth year], 76 y.o.   MRN: [id number]  DOS:  01/30/2021 Type of visit - description: rov  Since the last office visit he is doing well. Saw urology: Notes reviewed. GERD: Well-controlled Good compliance with cholesterol medication  Review of Systems See above   Past Medical History:  Diagnosis Date   Adenomatous polyp of colon    Allergy    seasonal   Cataract    Chronic headache    Esophageal reflux    Hyperlipidemia    Lower urinary tract symptoms (LUTS)     Past Surgical History:  Procedure Laterality Date   CHOLECYSTECTOMY  1998   EYE SURGERY Bilateral 11/27/2017   cataract and retina surgery    Allergies as of 01/30/2021       Reactions   Sulfonamide Derivatives Hives, Swelling   Urticaria        Medication List        Accurate as of January 30, 2021  9:12 AM. If you have any questions, ask your nurse or doctor.          atorvastatin 10 MG tablet Commonly known as: LIPITOR TAKE 1 TABLET EVERY DAY AT 6 PM.   RABEprazole 20 MG tablet Commonly known as: ACIPHEX Take 1 tablet (20 mg total) by mouth daily.   SENIOR MULTIVITAMIN PLUS PO Take 1 tablet by mouth daily.   tamsulosin 0.4 MG Caps capsule Commonly known as: FLOMAX           Objective:   Physical Exam BP 122/76 (BP Location: Left Arm, Patient Position: Sitting, Cuff Size: Small)    Pulse 80    Temp 97.9 F (36.6 C) (Oral)    Resp 16    Ht 5\' 6"  (1.676 m)    Wt 188 lb 4 oz (85.4 kg)    SpO2 98%    BMI 30.38 kg/m  General: Well developed, NAD, BMI noted Neck: No  thyromegaly  HEENT:  Normocephalic . Face symmetric, atraumatic Lungs:  CTA B Normal respiratory effort, no intercostal retractions, no accessory muscle use. Heart: RRR,  no murmur.  Abdomen:  Not distended, soft, non-tender. No rebound or rigidity.   Lower extremities: no pretibial edema bilaterally  Skin: Exposed areas without rash. Not pale. Not  jaundice Neurologic:  alert & oriented X3.  Speech normal, gait appropriate for age and unassisted Strength symmetric and appropriate for age.  Psych: Cognition and judgment appear intact.  Cooperative with normal attention span and concentration.  Behavior appropriate. No anxious or depressed appearing.     Assessment      Assessment Hyperlipidemia GERD Seasonal allergies Obesity H/o increased PSA , LUTS, sees urology Headaches, chronic COVID infection, Paxlovid, 05/2020  PLAN Hyperlipidemia: On atorvastatin 10 mg, his LDL is less than 100 but  CV RF at 10 years is 20.8%. Will check FLP and we agreed to increase atorvastatin to 20 mg with results to decrease even more his CV risk. GERD: Well-controlled, RF as needed Increase PSA: Urology note reviewed, last MRI satisfactory, plan is observation TSH: Increase since the last time we checked, labs. Thoracic pain: Resolved, see last visit. Preventive care reviewed. RTC 1 year.    This visit occurred during the SARS-CoV-2 public health emergency.  Safety protocols were in place, including screening questions prior to the visit, additional usage of staff PPE, and extensive cleaning of exam room while observing appropriate contact time as indicated for disinfecting  solutions.

## 2021-01-30 NOTE — Assessment & Plan Note (Signed)
Hyperlipidemia: On atorvastatin 10 mg, his LDL is less than 100 but  CV RF at 10 years is 20.8%. Will check FLP and we agreed to increase atorvastatin to 20 mg with results to decrease even more his CV risk. GERD: Well-controlled, RF as needed Increase PSA: Urology note reviewed, last MRI satisfactory, plan is observation TSH: Increase since the last time we checked, labs. Thoracic pain: Resolved, see last visit. Preventive care reviewed. RTC 1 year.

## 2021-01-30 NOTE — Patient Instructions (Signed)
Please read information about the "advance care planning", if you have a living will is a good idea to send [place]  a copy.  GO TO THE LAB : Get the blood work     [place] back for a checkup in 1 year

## 2021-02-01 ENCOUNTER — Encounter: Payer: Self-pay | Admitting: Internal Medicine

## 2021-02-01 MED ORDER — ATORVASTATIN CALCIUM 20 MG PO TABS: 20.0000 mg | ORAL_TABLET | Freq: Every day | ORAL | 3 refills | Status: DC

## 2021-02-01 NOTE — Addendum Note (Signed)
Addended byDamita Dunnings D on: 02/01/2021 09:40 AM   Modules accepted: Orders

## 2021-02-05 ENCOUNTER — Other Ambulatory Visit: Payer: Self-pay

## 2021-02-05 ENCOUNTER — Ambulatory Visit (AMBULATORY_SURGERY_CENTER): Payer: Medicare Other | Admitting: *Deleted

## 2021-02-05 VITALS — Ht 66.0 in | Wt 175.0 lb

## 2021-02-05 DIAGNOSIS — Z86010 Personal history of colonic polyps: Secondary | ICD-10-CM

## 2021-02-05 DIAGNOSIS — Z800 Family history of malignant neoplasm of digestive organs: Secondary | ICD-10-CM

## 2021-02-05 MED ORDER — PEG 3350-KCL-NA BICARB-NACL 420 G PO SOLR: 4000.0000 mL | Freq: Once | ORAL | 0 refills | Status: AC

## 2021-02-05 NOTE — Progress Notes (Signed)

## 2021-02-15 ENCOUNTER — Other Ambulatory Visit: Payer: Self-pay

## 2021-02-15 ENCOUNTER — Ambulatory Visit (AMBULATORY_SURGERY_CENTER): Payer: Medicare Other | Admitting: Gastroenterology

## 2021-02-15 ENCOUNTER — Encounter: Payer: Self-pay | Admitting: Gastroenterology

## 2021-02-15 VITALS — BP 121/53 | HR 68 | Temp 97.5°F | Resp 14 | Ht 66.0 in | Wt 175.0 lb

## 2021-02-15 DIAGNOSIS — D122 Benign neoplasm of ascending colon: Secondary | ICD-10-CM

## 2021-02-15 DIAGNOSIS — D123 Benign neoplasm of transverse colon: Secondary | ICD-10-CM

## 2021-02-15 DIAGNOSIS — K635 Polyp of colon: Secondary | ICD-10-CM | POA: Diagnosis not present

## 2021-02-15 DIAGNOSIS — Z86010 Personal history of colonic polyps: Secondary | ICD-10-CM | POA: Diagnosis not present

## 2021-02-15 DIAGNOSIS — Z800 Family history of malignant neoplasm of digestive organs: Secondary | ICD-10-CM

## 2021-02-15 MED ORDER — SODIUM CHLORIDE 0.9 % IV SOLN: 500.0000 mL | Freq: Once | INTRAVENOUS | Status: DC

## 2021-02-15 NOTE — Progress Notes (Signed)
A and O x3. Report to RN. Tolerated MAC anesthesia well. 

## 2021-02-15 NOTE — Progress Notes (Signed)
History and Physical:  This patient presents for endoscopic testing for: Encounter Diagnosis  Name Primary?   Family history of colon cancer Yes    Last colonoscopy 11/2015 Patient denies chronic abdominal pain, rectal bleeding, constipation or diarrhea.   ROS: Patient denies chest pain or cough   Past Medical History: Past Medical History:  Diagnosis Date   Adenomatous polyp of colon    Allergy    seasonal   Cataract    Chronic headache    Esophageal reflux    Hyperlipidemia    Lower urinary tract symptoms (LUTS)      Past Surgical History: Past Surgical History:  Procedure Laterality Date   CHOLECYSTECTOMY  1998   COLONOSCOPY     EYE SURGERY Bilateral 11/27/2017   cataract and retina surgery   POLYPECTOMY      Allergies: Allergies  Allergen Reactions   Sulfonamide Derivatives Hives and Swelling    Urticaria    Outpatient Meds: Current Outpatient Medications  Medication Sig Dispense Refill   atorvastatin (LIPITOR) 20 MG tablet Take 1 tablet (20 mg total) by mouth daily. 90 tablet 3   Multiple Vitamins-Minerals (SENIOR MULTIVITAMIN PLUS PO) Take 1 tablet by mouth daily.     RABEprazole (ACIPHEX) 20 MG tablet Take 1 tablet (20 mg total) by mouth daily. 90 tablet 3   tamsulosin (FLOMAX) 0.4 MG CAPS capsule      Current Facility-Administered Medications  Medication Dose Route Frequency Provider Last Rate Last Admin   0.9 %  sodium chloride infusion  500 mL Intravenous Once Danis, Estill Cotta III, MD          ___________________________________________________________________ Objective   Exam:  BP 140/71    Pulse 92    Temp (!) 97.5 F (36.4 C) (Temporal)    Resp (!) 9    Ht 5\' 6"  (1.676 m)    Wt 175 lb (79.4 kg)    SpO2 99%    BMI 28.25 kg/m   CV: RRR without murmur, S1/S2 Resp: clear to auscultation bilaterally, normal RR and effort noted GI: soft, no tenderness, with active bowel sounds.   Assessment: Encounter Diagnosis  Name Primary?   Family  history of colon cancer Yes     Plan: Colonoscopy  The benefits and risks of the planned procedure were described in detail with the patient or (when appropriate) their health care proxy.  Risks were outlined as including, but not limited to, bleeding, infection, perforation, adverse medication reaction leading to cardiac or pulmonary decompensation, pancreatitis (if ERCP).  The limitation of incomplete mucosal visualization was also discussed.  No guarantees or warranties were given.    The patient is appropriate for an endoscopic procedure in the ambulatory setting.   - Wilfrid Lund, MD

## 2021-02-15 NOTE — Patient Instructions (Addendum)
Handout on Hemorrhoids and polyps provided.  Await pathology results.  YOU HAD AN ENDOSCOPIC PROCEDURE TODAY AT [facility]:   Refer to the procedure report that was given to you for any specific questions about what was found during the examination.  If the procedure report does not answer your questions, please call your gastroenterologist to clarify.  If you requested that your care partner not be given the details of your procedure findings, then the procedure report has been included in a sealed envelope for you to review at your convenience later.  YOU SHOULD EXPECT: Some feelings of bloating in the abdomen. Passage of more gas than usual.  Walking can help get rid of the air that was put into your GI tract during the procedure and reduce the bloating. If you had a lower endoscopy (such as a colonoscopy or flexible sigmoidoscopy) you may notice spotting of blood in your stool or on the toilet paper. If you underwent a bowel prep for your procedure, you may not have a normal bowel movement for a few days.  Please Note:  You might notice some irritation and congestion in your nose or some drainage.  This is from the oxygen used during your procedure.  There is no need for concern and it should clear up in a day or so.  SYMPTOMS TO REPORT IMMEDIATELY:  Following lower endoscopy (colonoscopy or flexible sigmoidoscopy):  Excessive amounts of blood in the stool  Significant tenderness or worsening of abdominal pains  Swelling of the abdomen that is new, acute  Fever of 100F or higher  For urgent or emergent issues, a gastroenterologist can be reached at any hour by calling [phone]. Do not use MyChart messaging for urgent concerns.    DIET:  We do recommend a small meal at first, but then you may proceed to your regular diet.  Drink plenty of fluids but you should avoid alcoholic beverages for 24 hours.  ACTIVITY:  You should plan to take it easy for the rest of today  and you should NOT DRIVE or use heavy machinery until tomorrow (because of the sedation medicines used during the test).    FOLLOW UP: Our staff will call the number listed on your records 48-72 hours following your procedure to check on you and address any questions or concerns that you may have regarding the information given to you following your procedure. If we do not reach you, we will leave a message.  We will attempt to reach you two times.  During this call, we will ask if you have developed any symptoms of COVID 19. If you develop any symptoms (ie: fever, flu-like symptoms, shortness of breath, cough etc.) before then, please call [phone].  If you test positive for Covid 19 in the 2 weeks post procedure, please call and report this information to [place].    If any biopsies were taken you will be contacted by phone or by letter within the next 1-3 weeks.  Please call us at [phone] if you have not heard about the biopsies in 3 weeks.    SIGNATURES/CONFIDENTIALITY: You and/or your care partner have signed paperwork which will be entered into your electronic medical record.  These signatures attest to the fact that that the information above on your After Visit Summary has been reviewed and is understood.  Full responsibility of the confidentiality of this discharge information lies with you and/or your care-partner.

## 2021-02-15 NOTE — Progress Notes (Signed)
Vitals-CW  Pt's states no medical or surgical changes since previsit or office visit. 

## 2021-02-15 NOTE — Progress Notes (Signed)
Called to room to assist during endoscopic procedure.  Patient ID and intended procedure confirmed with present staff. Received instructions for my participation in the procedure from the performing physician.  

## 2021-02-15 NOTE — Op Note (Signed)
[place] Patient Name: Rick Campbell Procedure Date: 02/15/2021 8:43 AM MRN: [id number] Endoscopist: [place]. Loletha Carrow , MD Age: 76 Referring MD:  Date of Birth: 1946/01/04 Gender: Male Account #: [id number] Procedure:                Colonoscopy Indications:              Screening in patient at increased risk: Colorectal                            cancer in father 76 or older                           No polyps last colonoscopy 11/2015. 78mm TA in 2012 Medicines:                Monitored Anesthesia Care Procedure:                Pre-Anesthesia Assessment:                           - Prior to the procedure, a History and Physical                            was performed, and patient medications and                            allergies were reviewed. The patient's tolerance of                            previous anesthesia was also reviewed. The risks                            and benefits of the procedure and the sedation                            options and risks were discussed with the patient.                            All questions were answered, and informed consent                            was obtained. Prior Anticoagulants: The patient has                            taken no previous anticoagulant or antiplatelet                            agents. ASA Grade Assessment: II - A patient with                            mild systemic disease. After reviewing the risks                            and benefits, the patient was deemed in  satisfactory condition to undergo the procedure.                           After obtaining informed consent, the colonoscope                            was passed under direct vision. Throughout the                            procedure, the patient's blood pressure, pulse, and                            oxygen saturations were monitored continuously. The                            CF [id number] #[id number] was introduced  through the anus                            and advanced to the the cecum, identified by                            appendiceal orifice and ileocecal valve. The                            colonoscopy was somewhat difficult due to a                            redundant colon. Successful completion of the                            procedure was aided by using manual pressure and                            straightening and shortening the scope to obtain                            bowel loop reduction. The patient tolerated the                            procedure well. The quality of the bowel                            preparation was good. The ileocecal valve,                            appendiceal orifice, and rectum were photographed.                            The bowel preparation used was GoLYTELY. Scope In: 8:58:52 AM Scope Out: 9:34:10 AM Scope Withdrawal Time: 0 hours 23 minutes 32 seconds  Total Procedure Duration: 0 hours 35 minutes 18 seconds  Findings:                 The perianal and digital rectal examinations were  normal.                           Three flat and semi-sessile polyps were found in                            the mid transverse colon and distal transverse                            colon. The polyps were 8 to 12 mm in size with                            mucus caps. These polyps were removed with a                            piecemeal technique (on the largest polyp) using a                            cold snare. Resection and retrieval were complete.                           A 4 mm polyp was found in the proximal ascending                            colon. The polyp was sessile. The polyp was removed                            with a cold snare. Resection and retrieval were                            complete.                           Two flat and semi-sessile polyps were found in the                            proximal ascending  colon. The polyps were 10 to 15                            mm in size. These polyps were removed with a                            piecemeal technique using a cold snare. Resection                            and retrieval were complete.                           Internal hemorrhoids were found.                           The exam was otherwise without abnormality on  direct and retroflexion views. Complications:            No immediate complications. Estimated Blood Loss:     Estimated blood loss was minimal. Impression:               - Three 8 to 12 mm polyps in the mid transverse                            colon and in the distal transverse colon, removed                            piecemeal using a cold snare. Resected and                            retrieved.                           - One 4 mm polyp in the proximal ascending colon,                            removed with a cold snare. Resected and retrieved.                           - Two 10 to 15 mm polyps in the proximal ascending                            colon, removed piecemeal using a cold snare.                            Resected and retrieved.                           - Internal hemorrhoids.                           - The examination was otherwise normal on direct                            and retroflexion views. Recommendation:           - Patient has a contact number available for                            emergencies. The signs and symptoms of potential                            delayed complications were discussed with the                            patient. Return to normal activities tomorrow.                            Written discharge instructions were provided to the                            patient.                           -  Resume previous diet.                           - Continue present medications.                           - Await pathology results.                            - Repeat colonoscopy is recommended for                            surveillance. The colonoscopy date will be                            determined after pathology results from today's                            exam become available for review. Eliette Drumwright L. Loletha Carrow, MD 02/15/2021 9:41:06 AM This report has been signed electronically.

## 2021-02-19 ENCOUNTER — Telehealth: Payer: Self-pay | Admitting: *Deleted

## 2021-02-19 ENCOUNTER — Encounter: Payer: Self-pay | Admitting: Gastroenterology

## 2021-02-19 NOTE — Telephone Encounter (Signed)
°  Follow up Call-  Call back number 02/15/2021  Permission to leave phone message Yes  Some recent data might be hidden     Patient questions:  Do you have a fever, pain , or abdominal swelling? No. Pain Score  0 *  Have you tolerated food without any problems? Yes.    Have you been able to return to your normal activities? Yes.    Do you have any questions about your discharge instructions: Diet   No. Medications  No. Follow up visit  No.  Do you have questions or concerns about your Care? No.  Actions: * If pain score is 4 or above: No action needed, pain <4.  Have you developed a fever since your procedure? no  2.   Have you had an respiratory symptoms (SOB or cough) since your procedure? no  3.   Have you tested positive for COVID 19 since your procedure no  4.   Have you had any family members/close contacts diagnosed with the COVID 19 since your procedure?  no   If yes to any of these questions please route to Joylene John, RN and Joella Prince, RN

## 2021-03-13 DIAGNOSIS — N401 Enlarged prostate with lower urinary tract symptoms: Secondary | ICD-10-CM | POA: Diagnosis not present

## 2021-03-13 LAB — PSA: PSA: 6.37

## 2021-03-20 DIAGNOSIS — N401 Enlarged prostate with lower urinary tract symptoms: Secondary | ICD-10-CM | POA: Diagnosis not present

## 2021-03-20 DIAGNOSIS — R3915 Urgency of urination: Secondary | ICD-10-CM | POA: Diagnosis not present

## 2021-03-20 DIAGNOSIS — R972 Elevated prostate specific antigen [PSA]: Secondary | ICD-10-CM | POA: Diagnosis not present

## 2021-03-21 ENCOUNTER — Encounter: Payer: Self-pay | Admitting: Internal Medicine

## 2021-05-20 ENCOUNTER — Encounter: Payer: Self-pay | Admitting: Internal Medicine

## 2021-05-20 ENCOUNTER — Telehealth: Payer: Self-pay

## 2021-05-20 ENCOUNTER — Other Ambulatory Visit: Payer: Self-pay | Admitting: Internal Medicine

## 2021-05-20 MED ORDER — TAMSULOSIN HCL 0.4 MG PO CAPS: 0.4000 mg | ORAL_CAPSULE | Freq: Every day | ORAL | 1 refills | Status: DC

## 2021-05-20 NOTE — Telephone Encounter (Signed)
PA initiated via Covermymeds; KEY; [id number]. PA cancelled by plan.  ? ?Additional Information Required ?Prior Authorization Not Required ?

## 2021-05-30 MED ORDER — RABEPRAZOLE SODIUM 20 MG PO TBEC: 20.0000 mg | DELAYED_RELEASE_TABLET | Freq: Every day | ORAL | 3 refills | Status: DC

## 2021-07-15 ENCOUNTER — Ambulatory Visit (INDEPENDENT_AMBULATORY_CARE_PROVIDER_SITE_OTHER): Payer: Medicare Other

## 2021-07-15 VITALS — BP 124/78 | HR 71 | Temp 97.9°F | Resp 16 | Ht 65.0 in | Wt 173.8 lb

## 2021-07-15 DIAGNOSIS — Z0000 Encounter for general adult medical examination without abnormal findings: Secondary | ICD-10-CM | POA: Diagnosis not present

## 2021-07-15 NOTE — Patient Instructions (Signed)
Rick Campbell , Thank you for taking time to come for your Medicare Wellness Visit. I appreciate your ongoing commitment to your health goals. Please review the following plan we discussed and let me know if I can assist you in the future.   Screening recommendations/referrals: Colonoscopy: 02/15/21 due 02/16/24 Recommended yearly ophthalmology/optometry visit for glaucoma screening and checkup Recommended yearly dental visit for hygiene and checkup  Vaccinations: Influenza vaccine: up to date Pneumococcal vaccine: up to date Tdap vaccine: up to date Shingles vaccine: up to date   Covid-19: completed  Advanced directives: no, not on file  Conditions/risks identified: see problem list  Next appointment: Follow up in one year for your annual wellness visit. 07/16/22  Preventive Care 76 Years and Older, Male Preventive care refers to lifestyle choices and visits with your health care provider that can promote health and wellness. What does preventive care include? A yearly physical exam. This is also called an annual well check. Dental exams once or twice a year. Routine eye exams. Ask your health care provider how often you should have your eyes checked. Personal lifestyle choices, including: Daily care of your teeth and gums. Regular physical activity. Eating a healthy diet. Avoiding tobacco and drug use. Limiting alcohol use. Practicing safe sex. Taking low doses of aspirin every day. Taking vitamin and mineral supplements as recommended by your health care provider. What happens during an annual well check? The services and screenings done by your health care provider during your annual well check will depend on your age, overall health, lifestyle risk factors, and family history of disease. Counseling  Your health care provider may ask you questions about your: Alcohol use. Tobacco use. Drug use. Emotional well-being. Home and relationship well-being. Sexual activity. Eating  habits. History of falls. Memory and ability to understand (cognition). Work and work Statistician. Screening  You may have the following tests or measurements: Height, weight, and BMI. Blood pressure. Lipid and cholesterol levels. These may be checked every 5 years, or more frequently if you are over 51 years old. Skin check. Lung cancer screening. You may have this screening every year starting at age 76 if you have a 30-pack-year history of smoking and currently smoke or have quit within the past 15 years. Fecal occult blood test (FOBT) of the stool. You may have this test every year starting at age 76. Flexible sigmoidoscopy or colonoscopy. You may have a sigmoidoscopy every 5 years or a colonoscopy every 10 years starting at age 76. Prostate cancer screening. Recommendations will vary depending on your family history and other risks. Hepatitis C blood test. Hepatitis B blood test. Sexually transmitted disease (STD) testing. Diabetes screening. This is done by checking your blood sugar (glucose) after you have not eaten for a while (fasting). You may have this done every 76 years. Abdominal aortic aneurysm (AAA) screening. You may need this if you are a current or former smoker. Osteoporosis. You may be screened starting at age 76 if you are at high risk. Talk with your health care provider about your test results, treatment options, and if necessary, the need for more tests. Vaccines  Your health care provider may recommend certain vaccines, such as: Influenza vaccine. This is recommended every year. Tetanus, diphtheria, and acellular pertussis (Tdap, Td) vaccine. You may need a Td booster every 10 years. Zoster vaccine. You may need this after age 32. Pneumococcal 13-valent conjugate (PCV13) vaccine. One dose is recommended after age 43. Pneumococcal polysaccharide (PPSV23) vaccine. One dose is recommended  after age 66. Talk to your health care provider about which screenings and  vaccines you need and how often you need them. This information is not intended to replace advice given to you by your health care provider. Make sure you discuss any questions you have with your health care provider. Document Released: 02/09/2015 Document Revised: 10/03/2015 Document Reviewed: 11/14/2014 Elsevier Interactive Patient Education  2017 [place] Prevention in the Home Falls can cause injuries. They can happen to people of all ages. There are many things you can do to make your home safe and to help prevent falls. What can I do on the outside of my home? Regularly fix the edges of walkways and driveways and fix any cracks. Remove anything that might make you trip as you walk through a door, such as a raised step or threshold. Trim any bushes or trees on the path to your home. Use bright outdoor lighting. Clear any walking paths of anything that might make someone trip, such as rocks or tools. Regularly check to see if handrails are loose or broken. Make sure that both sides of any steps have handrails. Any raised decks and porches should have guardrails on the edges. Have any leaves, snow, or ice cleared regularly. Use sand or salt on walking paths during winter. Clean up any spills in your garage right away. This includes oil or grease spills. What can I do in the bathroom? Use night lights. Install grab bars by the toilet and in the tub and shower. Do not use towel bars as grab bars. Use non-skid mats or decals in the tub or shower. If you need to sit down in the shower, use a plastic, non-slip stool. Keep the floor dry. Clean up any water that spills on the floor as soon as it happens. Remove soap buildup in the tub or shower regularly. Attach bath mats securely with double-sided non-slip rug tape. Do not have throw rugs and other things on the floor that can make you trip. What can I do in the bedroom? Use night lights. Make sure that you have a light by your  bed that is easy to reach. Do not use any sheets or blankets that are too big for your bed. They should not hang down onto the floor. Have a firm chair that has side arms. You can use this for support while you get dressed. Do not have throw rugs and other things on the floor that can make you trip. What can I do in the kitchen? Clean up any spills right away. Avoid walking on wet floors. Keep items that you use a lot in easy-to-reach places. If you need to reach something above you, use a strong step stool that has a grab bar. Keep electrical cords out of the way. Do not use floor polish or wax that makes floors slippery. If you must use wax, use non-skid floor wax. Do not have throw rugs and other things on the floor that can make you trip. What can I do with my stairs? Do not leave any items on the stairs. Make sure that there are handrails on both sides of the stairs and use them. Fix handrails that are broken or loose. Make sure that handrails are as long as the stairways. Check any carpeting to make sure that it is firmly attached to the stairs. Fix any carpet that is loose or worn. Avoid having throw rugs at the top or bottom of the stairs. If you  do have throw rugs, attach them to the floor with carpet tape. Make sure that you have a light switch at the top of the stairs and the bottom of the stairs. If you do not have them, ask someone to add them for you. What else can I do to help prevent falls? Wear shoes that: Do not have high heels. Have rubber bottoms. Are comfortable and fit you well. Are closed at the toe. Do not wear sandals. If you use a stepladder: Make sure that it is fully opened. Do not climb a closed stepladder. Make sure that both sides of the stepladder are locked into place. Ask someone to hold it for you, if possible. Clearly mark and make sure that you can see: Any grab bars or handrails. First and last steps. Where the edge of each step is. Use tools that  help you move around (mobility aids) if they are needed. These include: Canes. Walkers. Scooters. Crutches. Turn on the lights when you go into a dark area. Replace any light bulbs as soon as they burn out. Set up your furniture so you have a clear path. Avoid moving your furniture around. If any of your floors are uneven, fix them. If there are any pets around you, be aware of where they are. Review your medicines with your doctor. Some medicines can make you feel dizzy. This can increase your chance of falling. Ask your doctor what other things that you can do to help prevent falls. This information is not intended to replace advice given to you by your health care provider. Make sure you discuss any questions you have with your health care provider. Document Released: 11/09/2008 Document Revised: 06/21/2015 Document Reviewed: 02/17/2014 Elsevier Interactive Patient Education  2017 [employer].

## 2021-07-15 NOTE — Progress Notes (Addendum)
Subjective:   Rick Campbell is a 76 y.o. male who presents for Medicare Annual/Subsequent preventive examination.   Review of Systems     Cardiac Risk Factors include: male gender;dyslipidemia;advanced age (>10mn, >>2women)     Objective:    Today's Vitals   07/15/21 0813  BP: 124/78  Pulse: 71  Resp: 16  Temp: 97.9 F (36.6 C)  SpO2: 99%  Weight: 173 lb 12.8 oz (78.8 kg)  Height: '5\' 5"'$  (1.651 m)   Body mass index is 28.92 kg/m.     07/15/2021    8:09 AM 07/09/2020    8:20 AM 07/05/2019    9:35 AM 07/02/2018    9:04 AM 06/30/2017    2:17 PM  Advanced Directives  Does Patient Have a Medical Advance Directive? Yes Yes Yes Yes Yes  Type of AParamedicof [place]rest[place] of facility DNR (pink MOST or yellow form);Living will H[place]Living will HChilchinbitoLiving will HPlacedoLiving will HYeringtonLiving will  Does patient want to make changes to medical advance directive? No - Patient declined  No - Patient declined No - Patient declined   Copy of HBowlegsin Chart? No - copy requested No - copy requested No - copy requested No - copy requested No - copy requested    Current Medications (verified) Outpatient Encounter Medications as of 07/15/2021  Medication Sig   atorvastatin (LIPITOR) 20 MG tablet Take 1 tablet (20 mg total) by mouth daily.   Multiple Vitamins-Minerals (SENIOR MULTIVITAMIN PLUS PO) Take 1 tablet by mouth daily.   RABEprazole (ACIPHEX) 20 MG tablet Take 1 tablet (20 mg total) by mouth daily.   tamsulosin (FLOMAX) 0.4 MG CAPS capsule Take 1 capsule (0.4 mg total) by mouth daily after supper.   No facility-administered encounter medications on file as of 07/15/2021.    Allergies (verified) Sulfonamide derivatives   History: Past Medical History:  Diagnosis Date   Adenomatous polyp of colon    Allergy    seasonal   Cataract     Chronic headache    Esophageal reflux    Hyperlipidemia    Lower urinary tract symptoms (LUTS)    Past Surgical History:  Procedure Laterality Date   CHOLECYSTECTOMY  1998   COLONOSCOPY     EYE SURGERY Bilateral 11/27/2017   cataract and retina surgery   POLYPECTOMY     Family History  Problem Relation Age of Onset   Lung cancer Mother        was a heavy smoker   Colon cancer Father        dx age 8132s  Diabetes Father    Heart Problems Father        father CHF in his 991s  COPD Neg Hx    Arthritis Neg Hx    Prostate cancer Neg Hx    Esophageal cancer Neg Hx    Stomach cancer Neg Hx    Rectal cancer Neg Hx    Social History   Socioeconomic History   Marital status: Married    Spouse name: Not on file   Number of children: 2   Years of education: Not on file   Highest education level: Not on file  Occupational History   Occupation: retired- SEducation administrator   Comment: Marketing  Tobacco Use   Smoking status: Never   Smokeless tobacco: Never  Vaping Use   Vaping Use: Never used  Substance and Sexual Activity   Alcohol use: Yes    Comment: 1 or 2 glasses of wine every week   Drug use: No   Sexual activity: Yes  Other Topics Concern   Not on file  Social History Narrative   retired from [employer]  , lives w/ wife , both children in [place] Strain: [place]  (07/09/2020)   Overall Financial Resource Strain (CARDIA)    Difficulty of Paying Living Expenses: Not hard at all  Food Insecurity: No Food Insecurity (07/09/2020)   Hunger Vital Sign    Worried About Running Out of Food in the Last Year: Never true    [place] in the Last Year: Never true  Transportation Needs: No Transportation Needs (07/09/2020)   PRAPARE - Hydrologist (Medical): No    Lack of Transportation (Non-Medical): No  Physical Activity: Sufficiently Active (07/09/2020)   Exercise Vital Sign    Days of  Exercise per Week: 4 days    Minutes of Exercise per Session: 60 min  Stress: No Stress Concern Present (07/09/2020)   [place]    Feeling of Stress : Not at all  Social Connections: [place] (07/09/2020)   Social Connection and Isolation Panel [NHANES]    Frequency of Communication with Friends and Family: More than three times a week    Frequency of Social Gatherings with Friends and Family: More than three times a week    Attends Religious Services: More than 4 times per year    Active Member of [employer] or Organizations: Yes    Attends Music therapist: More than 4 times per year    Marital Status: Married    Tobacco Counseling Counseling given: Not Answered   Clinical Intake:  Pre-visit preparation completed: Yes  Pain : No/denies pain     BMI - recorded: 28.92 Nutritional Status: BMI 25 -29 Overweight Nutritional Risks: None Diabetes: No  How often do you need to have someone help you when you read instructions, pamphlets, or other written materials from your doctor or pharmacy?: 1 - Never  Diabetic?No  Interpreter Needed?: No  Information entered by :: [place] of Daily Living    07/15/2021    8:16 AM  In your present state of health, do you have any difficulty performing the following activities:  Hearing? 1  Vision? 0  Difficulty concentrating or making decisions? 0  Walking or climbing stairs? 0  Dressing or bathing? 0  Doing errands, shopping? 0  Preparing Food and eating ? N  Using the Toilet? N  In the past six months, have you accidently leaked urine? N  Do you have problems with loss of bowel control? N  Managing your Medications? N  Managing your Finances? N  Housekeeping or managing your Housekeeping? N    Patient Care Team: Colon Branch, MD as PCP - General (Internal Medicine) Jarome Matin, MD as Consulting Physician  (Dermatology) Tanda Rockers, MD as Consulting Physician (Pulmonary Disease) Darleen Crocker, MD as Consulting Physician (Ophthalmology) Festus Aloe, MD as Consulting Physician (Urology)  Indicate any recent Medical Services you may have received from other than [facility] providers in the past year (date may be approximate).     Assessment:   This is a routine wellness examination for Brenen.  Hearing/Vision screen No  results found.  Dietary issues and exercise activities discussed: Current Exercise Habits: Home exercise routine, Type of exercise: walking;stretching, Time (Minutes): 45, Frequency (Times/Week): 5, Weekly Exercise (Minutes/Week): 225, Intensity: Mild, Exercise limited by: None identified   Goals Addressed             This Visit's Progress    maintain current healthy lifestyle   On track      Depression Screen    07/15/2021    8:12 AM 01/30/2021    8:58 AM 08/03/2020    1:52 PM 07/09/2020    8:21 AM 01/30/2020    8:29 AM 07/05/2019    9:47 AM 07/02/2018    9:05 AM  PHQ 2/9 Scores  PHQ - 2 Score 0 0 0 0 0 0 0    Fall Risk    07/15/2021    8:10 AM 01/30/2021    8:58 AM 08/03/2020    1:52 PM 07/09/2020    8:21 AM 01/30/2020    8:29 AM  [place] in the past year? 0 0 0 0 0  Number falls in past yr: 0 0 0 0 0  Injury with Fall? 0 0 0 0 0  Risk for fall due to : No Fall Risks      Follow up Falls evaluation completed Falls evaluation completed Falls evaluation completed Falls prevention discussed     [place]:  Any stairs in or around the home? Yes  If so, are there any without handrails? No  Home free of loose throw rugs in walkways, pet beds, electrical cords, etc? Yes  Adequate lighting in your home to reduce risk of falls? Yes   ASSISTIVE DEVICES UTILIZED TO PREVENT FALLS:  Life alert? No  Use of a cane, walker or w/c? No  Grab bars in the bathroom? No  Shower chair or bench in shower? No  Elevated toilet seat  or a handicapped toilet? Yes   TIMED UP AND GO:  Was the test performed? Yes .  Length of time to ambulate 10 feet: 9 sec.   Gait steady and fast without use of assistive device  Cognitive Function:        07/15/2021    8:18 AM  6CIT Screen  What Year? 0 points  What month? 0 points  What time? 0 points  Count back from 20 0 points  Months in reverse 0 points  Repeat phrase 0 points  Total Score 0 points    Immunizations Immunization History  Administered Date(s) Administered   Fluad Quad(high Dose 65+) 10/12/2018   Influenza Split 11/26/2010   Influenza, High Dose Seasonal PF 11/07/2014, 11/14/2015, 11/12/2016, 11/17/2017   Influenza,inj,Quad PF,6+ Mos 11/22/2013   Influenza-Unspecified 11/27/2012, 10/17/2019, 11/01/2020   PFIZER(Purple Top)SARS-COV-2 Vaccination 02/22/2019, 03/15/2019, 10/24/2019, 05/28/2020   PNEUMOCOCCAL CONJUGATE-20 01/30/2021   Pfizer Covid-19 Vaccine Bivalent Booster 77yr & up 11/01/2020   Pneumococcal Conjugate-13 08/10/2013   Pneumococcal Polysaccharide-23 06/21/2010   Td 09/15/2003   Tetanus 08/10/2013   Zoster Recombinat (Shingrix) 06/02/2017, 08/06/2017   Zoster, Live 09/10/2009    TDAP status: Up to date  Flu Vaccine status: Up to date  Pneumococcal vaccine status: Up to date  Covid-19 vaccine status: Completed vaccines  Qualifies for Shingles Vaccine? No   Zostavax completed No   Shingrix Completed?: Yes  Screening Tests Health Maintenance  Topic Date Due   COVID-19 Vaccine (6 - Pfizer series) 03/04/2021   INFLUENZA VACCINE  08/27/2021   TETANUS/TDAP  08/11/2023   COLONOSCOPY (Pts 45-78yr Insurance coverage will need to be confirmed)  02/16/2024   Pneumonia Vaccine 76 Years old  Completed   Hepatitis C Screening  Completed   Zoster Vaccines- Shingrix  Completed   HPV VACCINES  Aged Out    Health Maintenance  Health Maintenance Due  Topic Date Due   COVID-19 Vaccine (6 - Pfizer series) 03/04/2021    Colorectal  cancer screening: Type of screening: Colonoscopy. Completed 02/15/21. Repeat every 3 years  Lung Cancer Screening: (Low Dose CT Chest recommended if Age 152-80years, 30 pack-year currently smoking OR have quit w/in 15years.) does not qualify.   Lung Cancer Screening Referral: N/A  Additional Screening:  Hepatitis C Screening: does qualify; Completed 11/07/14  Vision Screening: Recommended annual ophthalmology exams for early detection of glaucoma and other disorders of the eye. Is the patient up to date with their annual eye exam?  Yes  Who is the provider or what is the name of the office in which the patient attends annual eye exams? Dr. WClydene LamingIf pt is not established with a provider, would they like to be referred to a provider to establish care? No .   Dental Screening: Recommended annual dental exams for proper oral hygiene  Community Resource Referral / Chronic Care Management: CRR required this visit?  No   CCM required this visit?  No      Plan:     I have personally reviewed and noted the following in the patient's chart:   Medical and social history Use of alcohol, tobacco or illicit drugs  Current medications and supplements including opioid prescriptions. Patient is not currently taking opioid prescriptions. Functional ability and status Nutritional status Physical activity Advanced directives List of other physicians Hospitalizations, surgeries, and ER visits in previous 12 months Vitals Screenings to include cognitive, depression, and falls Referrals and appointments  In addition, I have reviewed and discussed with patient certain preventive protocols, quality metrics, and best practice recommendations. A written personalized care plan for preventive services as well as general preventive health recommendations were provided to patient.     SDuard BradyChism, CGlobe  07/15/2021   Nurse Notes: none

## 2021-07-16 ENCOUNTER — Encounter: Payer: Self-pay | Admitting: Internal Medicine

## 2021-09-06 DIAGNOSIS — R972 Elevated prostate specific antigen [PSA]: Secondary | ICD-10-CM | POA: Diagnosis not present

## 2021-09-13 DIAGNOSIS — N401 Enlarged prostate with lower urinary tract symptoms: Secondary | ICD-10-CM | POA: Diagnosis not present

## 2021-09-13 DIAGNOSIS — R3912 Poor urinary stream: Secondary | ICD-10-CM | POA: Diagnosis not present

## 2021-09-13 DIAGNOSIS — R972 Elevated prostate specific antigen [PSA]: Secondary | ICD-10-CM | POA: Diagnosis not present

## 2021-09-19 ENCOUNTER — Encounter: Payer: Self-pay | Admitting: Family Medicine

## 2021-09-19 ENCOUNTER — Telehealth: Payer: Self-pay

## 2021-09-19 ENCOUNTER — Ambulatory Visit (INDEPENDENT_AMBULATORY_CARE_PROVIDER_SITE_OTHER): Payer: Medicare Other | Admitting: Family Medicine

## 2021-09-19 VITALS — BP 122/78 | HR 67 | Temp 97.7°F | Resp 18 | Ht 65.0 in | Wt 185.0 lb

## 2021-09-19 DIAGNOSIS — J0140 Acute pansinusitis, unspecified: Secondary | ICD-10-CM | POA: Diagnosis not present

## 2021-09-19 DIAGNOSIS — Z111 Encounter for screening for respiratory tuberculosis: Secondary | ICD-10-CM | POA: Diagnosis not present

## 2021-09-19 MED ORDER — AMOXICILLIN-POT CLAVULANATE 875-125 MG PO TABS: 1.0000 | ORAL_TABLET | Freq: Two times a day (BID) | ORAL | 0 refills | Status: DC

## 2021-09-19 NOTE — Telephone Encounter (Signed)
[place] application completed, awaiting TB gold results and PCP signature once he returns to office.

## 2021-09-19 NOTE — Assessment & Plan Note (Signed)
Tb gold test ordered  Paper work left in pcp office for independent living

## 2021-09-19 NOTE — Assessment & Plan Note (Signed)
augmentin sent in con't flonase Can add astepro F/u prn

## 2021-09-19 NOTE — Progress Notes (Signed)
Established Patient Office Visit  Subjective   Patient ID: Rick Campbell, male    DOB: [birth year]  Age: 76 y.o. MRN: [id number]  Chief Complaint  Patient presents with   Post COVID     Pt tested positive for COVID on Aug 1st. Pt states still having headache, dry cough, sinus drainage. Pt states using OTC Mucinex D, Tylenol, and nasal spray w/o much relief     HPI Pt is here because he tested positive for covid aug 1 and now has congestion -- sinus pressure/ headache + cough , no fever   Patient Active Problem List   Diagnosis Date Noted   Acute non-recurrent pansinusitis 09/19/2021   Tuberculosis screening 09/19/2021   COVID-19 06/07/2020   Cough variant asthma 01/18/2015   PCP NOTES >>> 11/07/2014   Allergic rhinitis 05/11/2013   Tinnitus and hearing loss 03/23/2012   Annual physical exam 06/21/2010   Upper airway cough syndrome 04/09/2010   Hyperlipidemia 09/10/2009   PSA, INCREASED and LUTs 11/29/2008   GERD 05/29/2008   Past Medical History:  Diagnosis Date   Adenomatous polyp of colon    Allergy    seasonal   Cataract    Chronic headache    Esophageal reflux    Hyperlipidemia    Lower urinary tract symptoms (LUTS)    Past Surgical History:  Procedure Laterality Date   CHOLECYSTECTOMY  1998   COLONOSCOPY     EYE SURGERY Bilateral 11/27/2017   cataract and retina surgery   POLYPECTOMY     Social History   Tobacco Use   Smoking status: Never   Smokeless tobacco: Never  Vaping Use   Vaping Use: Never used  Substance Use Topics   Alcohol use: Yes    Comment: 1 or 2 glasses of wine every week   Drug use: No   Social History   Socioeconomic History   Marital status: Married    Spouse name: Not on file   Number of children: 2   Years of education: Not on file   Highest education level: Not on file  Occupational History   Occupation: retired- Education administrator    Comment: Marketing  Tobacco Use   Smoking status: Never   Smokeless tobacco: Never   Vaping Use   Vaping Use: Never used  Substance and Sexual Activity   Alcohol use: Yes    Comment: 1 or 2 glasses of wine every week   Drug use: No   Sexual activity: Yes  Other Topics Concern   Not on file  Social History Narrative   retired from [employer]  , lives w/ wife , both children in [place] Determinants of Health   Financial Resource Strain: [place]  (07/09/2020)   Overall Financial Resource Strain (CARDIA)    Difficulty of Paying Living Expenses: Not hard at all  Food Insecurity: No Food Insecurity (07/09/2020)   Hunger Vital Sign    Worried About Running Out of Food in the Last Year: Never true    Ran Out of Food in the Last Year: Never true  Transportation Needs: No Transportation Needs (07/09/2020)   PRAPARE - Hydrologist (Medical): No    Lack of Transportation (Non-Medical): No  Physical Activity: Sufficiently Active (07/09/2020)   Exercise Vital Sign    Days of Exercise per Week: 4 days    Minutes of Exercise per Session: 60 min  Stress: No Stress Concern Present (07/09/2020)  [place]    Feeling of Stress : Not at all  Social Connections: Socially Integrated (07/09/2020)   Social Connection and Isolation Panel [NHANES]    Frequency of Communication with Friends and Family: More than three times a week    Frequency of Social Gatherings with Friends and Family: More than three times a week    Attends Religious Services: More than 4 times per year    Active Member of [employer] or Organizations: Yes    Attends Music therapist: More than 4 times per year    Marital Status: Married  Human resources officer Violence: Not At Risk (07/09/2020)   Humiliation, Afraid, Rape, and Kick questionnaire    Fear of Current or Ex-Partner: No    Emotionally Abused: No    Physically Abused: No    Sexually Abused: No   Family Status  Relation Name Status   Mother  (Not  Specified)   Father  Deceased   Neg Hx  (Not Specified)   Family History  Problem Relation Age of Onset   Lung cancer Mother        was a heavy smoker   Colon cancer Father        dx age 13s   Diabetes Father    Heart Problems Father        father CHF in his 27s   COPD Neg Hx    Arthritis Neg Hx    Prostate cancer Neg Hx    Esophageal cancer Neg Hx    Stomach cancer Neg Hx    Rectal cancer Neg Hx    Allergies  Allergen Reactions   Sulfonamide Derivatives Hives and Swelling    Urticaria      Review of Systems  Constitutional:  Negative for chills, fever and malaise/fatigue.  HENT:  Positive for congestion, sinus pain and sore throat. Negative for hearing loss.   Eyes:  Negative for discharge.  Respiratory:  Positive for cough. Negative for sputum production and shortness of breath.   Cardiovascular:  Negative for chest pain, palpitations and leg swelling.  Gastrointestinal:  Negative for abdominal pain, blood in stool, constipation, diarrhea, heartburn, nausea and vomiting.  Genitourinary:  Negative for dysuria, frequency, hematuria and urgency.  Musculoskeletal:  Negative for back pain, falls and myalgias.  Skin:  Negative for rash.  Neurological:  Negative for dizziness, sensory change, loss of consciousness, weakness and headaches.  Endo/Heme/Allergies:  Negative for environmental allergies. Does not bruise/bleed easily.  Psychiatric/Behavioral:  Negative for depression and suicidal ideas. The patient is not nervous/anxious and does not have insomnia.       Objective:     BP 122/78 (BP Location: Left Arm, Patient Position: Sitting, Cuff Size: Normal)   Pulse 67   Temp 97.7 F (36.5 C) (Oral)   Resp 18   Ht '5\' 5"'$  (1.651 m)   Wt 185 lb (83.9 kg)   SpO2 95%   BMI 30.79 kg/m  BP Readings from Last 3 Encounters:  09/19/21 122/78  07/15/21 124/78  02/15/21 (!) 121/53   Wt Readings from Last 3 Encounters:  09/19/21 185 lb (83.9 kg)  07/15/21 173 lb 12.8 oz  (78.8 kg)  02/15/21 175 lb (79.4 kg)   SpO2 Readings from Last 3 Encounters:  09/19/21 95%  07/15/21 99%  02/15/21 98%      Physical Exam Vitals and nursing note reviewed.  Constitutional:      Appearance: He is well-developed.  HENT:  Right Ear: External ear normal.     Left Ear: External ear normal.     Nose:     Right Sinus: Maxillary sinus tenderness and frontal sinus tenderness present.     Left Sinus: Maxillary sinus tenderness and frontal sinus tenderness present.  Eyes:     General:        Right eye: No discharge.        Left eye: No discharge.     Conjunctiva/sclera: Conjunctivae normal.  Cardiovascular:     Rate and Rhythm: Normal rate and regular rhythm.     Heart sounds: Normal heart sounds. No murmur heard. Pulmonary:     Effort: Pulmonary effort is normal. No respiratory distress.     Breath sounds: Normal breath sounds. No wheezing or rales.  Chest:     Chest wall: No tenderness.  Lymphadenopathy:     Cervical: Cervical adenopathy present.  Neurological:     Mental Status: He is alert and oriented to person, place, and time.      No results found for any visits on 09/19/21.  Last CBC Lab Results  Component Value Date   WBC 5.7 01/30/2020   HGB 16.3 01/30/2020   HCT 48.3 01/30/2020   MCV 94.2 01/30/2020   MCH 31.7 06/21/2010   RDW 13.7 01/30/2020   PLT 199.0 22/29/7989   Last metabolic panel Lab Results  Component Value Date   GLUCOSE 89 01/30/2021   NA 141 01/30/2021   K 4.1 01/30/2021   CL 103 01/30/2021   CO2 31 01/30/2021   BUN 14 01/30/2021   CREATININE 0.89 01/30/2021   CALCIUM 9.1 01/30/2021   PROT 6.7 01/30/2021   ALBUMIN 4.1 01/30/2021   BILITOT 0.6 01/30/2021   ALKPHOS 55 01/30/2021   AST 23 01/30/2021   ALT 35 01/30/2021   Last lipids Lab Results  Component Value Date   CHOL 170 01/30/2021   HDL 50.70 01/30/2021   LDLCALC 100 (H) 01/30/2021   LDLDIRECT 154.6 07/11/2011   TRIG 99.0 01/30/2021   CHOLHDL 3  01/30/2021   Last hemoglobin A1c Lab Results  Component Value Date   HGBA1C 5.3 05/17/2014   Last thyroid functions Lab Results  Component Value Date   TSH 1.97 01/30/2021   Last vitamin D No results found for: "25OHVITD2", "25OHVITD3", "VD25OH" Last vitamin B12 and Folate No results found for: "VITAMINB12", "FOLATE"    The 10-year ASCVD risk score (Arnett DK, et al., 2019) is: 23.2%    Assessment & Plan:   Problem List Items Addressed This Visit       Unprioritized   Tuberculosis screening    Tb gold test ordered  Paper work left in pcp office for independent living       Relevant Orders   QuantiFERON-TB Gold Plus   Acute non-recurrent pansinusitis - Primary    augmentin sent in con't flonase Can add astepro F/u prn       Relevant Medications   amoxicillin-clavulanate (AUGMENTIN) 875-125 MG tablet    Return if symptoms worsen or fail to improve.    Ann Held, DO

## 2021-09-19 NOTE — Patient Instructions (Signed)
Astepro--- antihistamine nasal spray    Sinus Infection, Adult A sinus infection, also called sinusitis, is inflammation of your sinuses. Sinuses are hollow spaces in the bones around your face. Your sinuses are located: Around your eyes. In the middle of your forehead. Behind your nose. In your cheekbones. Mucus normally drains out of your sinuses. When your nasal tissues become inflamed or swollen, mucus can become trapped or blocked. This allows bacteria, viruses, and fungi to grow, which leads to infection. Most infections of the sinuses are caused by a virus. A sinus infection can develop quickly. It can last for up to 4 weeks (acute) or for more than 12 weeks (chronic). A sinus infection often develops after a cold. What are the causes? This condition is caused by anything that creates swelling in the sinuses or stops mucus from draining. This includes: Allergies. Asthma. Infection from bacteria or viruses. Deformities or blockages in your nose or sinuses. Abnormal growths in the nose (nasal polyps). Pollutants, such as chemicals or irritants in the air. Infection from fungi. This is rare. What increases the risk? You are more likely to develop this condition if you: Have a weak body defense system (immune system). Do a lot of swimming or diving. Overuse nasal sprays. Smoke. What are the signs or symptoms? The main symptoms of this condition are pain and a feeling of pressure around the affected sinuses. Other symptoms include: Stuffy nose or congestion that makes it difficult to breathe through your nose. Thick yellow or greenish drainage from your nose. Tenderness, swelling, and warmth over the affected sinuses. A cough that may get worse at night. Decreased sense of smell and taste. Extra mucus that collects in the throat or the back of the nose (postnasal drip) causing a sore throat or bad breath. Tiredness (fatigue). Fever. How is this diagnosed? This condition is  diagnosed based on: Your symptoms. Your medical history. A physical exam. Tests to find out if your condition is acute or chronic. This may include: Checking your nose for nasal polyps. Viewing your sinuses using a device that has a light (endoscope). Testing for allergies or bacteria. Imaging tests, such as an MRI or CT scan. In rare cases, a bone biopsy may be done to rule out more serious types of fungal sinus disease. How is this treated? Treatment for a sinus infection depends on the cause and whether your condition is chronic or acute. If caused by a virus, your symptoms should go away on their own within 10 days. You may be given medicines to relieve symptoms. They include: Medicines that shrink swollen nasal passages (decongestants). A spray that eases inflammation of the nostrils (topical intranasal corticosteroids). Rinses that help get rid of thick mucus in your nose (nasal saline washes). Medicines that treat allergies (antihistamines). Over-the-counter pain relievers. If caused by bacteria, your health care provider may recommend waiting to see if your symptoms improve. Most bacterial infections will get better without antibiotic medicine. You may be given antibiotics if you have: A severe infection. A weak immune system. If caused by narrow nasal passages or nasal polyps, surgery may be needed. Follow these instructions at home: Medicines Take, use, or apply over-the-counter and prescription medicines only as told by your health care provider. These may include nasal sprays. If you were prescribed an antibiotic medicine, take it as told by your health care provider. Do not stop taking the antibiotic even if you start to feel better. Hydrate and humidify  Drink enough fluid to keep your  urine pale yellow. Staying hydrated will help to thin your mucus. Use a cool mist humidifier to keep the humidity level in your home above 50%. Inhale steam for 10-15 minutes, 3-4 times a  day, or as told by your health care provider. You can do this in the bathroom while a hot shower is running. Limit your exposure to cool or dry air. Rest Rest as much as possible. Sleep with your head raised (elevated). Make sure you get enough sleep each night. General instructions  Apply a warm, moist washcloth to your face 3-4 times a day or as told by your health care provider. This will help with discomfort. Use nasal saline washes as often as told by your health care provider. Wash your hands often with soap and water to reduce your exposure to germs. If soap and water are not available, use hand sanitizer. Do not smoke. Avoid being around people who are smoking (secondhand smoke). Keep all follow-up visits. This is important. Contact a health care provider if: You have a fever. Your symptoms get worse. Your symptoms do not improve within 10 days. Get help right away if: You have a severe headache. You have persistent vomiting. You have severe pain or swelling around your face or eyes. You have vision problems. You develop confusion. Your neck is stiff. You have trouble breathing. These symptoms may be an emergency. Get help right away. Call 911. Do not wait to see if the symptoms will go away. Do not drive yourself to the hospital. Summary A sinus infection is soreness and inflammation of your sinuses. Sinuses are hollow spaces in the bones around your face. This condition is caused by nasal tissues that become inflamed or swollen. The swelling traps or blocks the flow of mucus. This allows bacteria, viruses, and fungi to grow, which leads to infection. If you were prescribed an antibiotic medicine, take it as told by your health care provider. Do not stop taking the antibiotic even if you start to feel better. Keep all follow-up visits. This is important. This information is not intended to replace advice given to you by your health care provider. Make sure you discuss any  questions you have with your health care provider. Document Revised: 12/18/2020 Document Reviewed: 12/18/2020 Elsevier Patient Education  [place].

## 2021-09-22 LAB — QUANTIFERON-TB GOLD PLUS
Mitogen-NIL: >10 IU/mL
NIL: 0.05 IU/mL
QuantiFERON-TB Gold Plus: NEGATIVE
TB1-NIL: 0.02 IU/mL
TB2-NIL: <0 IU/mL

## 2021-10-01 DIAGNOSIS — Z0279 Encounter for issue of other medical certificate: Secondary | ICD-10-CM

## 2021-10-01 NOTE — Telephone Encounter (Signed)
Completed forms, OV note, labs mailed to [facility] in envelope provided. Copy of forms sent for scanning.

## 2021-10-29 DIAGNOSIS — Z23 Encounter for immunization: Secondary | ICD-10-CM | POA: Diagnosis not present

## 2021-11-26 ENCOUNTER — Other Ambulatory Visit: Payer: Self-pay | Admitting: Internal Medicine

## 2021-12-06 ENCOUNTER — Encounter: Payer: Self-pay | Admitting: Internal Medicine

## 2021-12-06 MED ORDER — PANTOPRAZOLE SODIUM 40 MG PO TBEC: 40.0000 mg | DELAYED_RELEASE_TABLET | Freq: Every day | ORAL | 1 refills | Status: DC

## 2021-12-06 NOTE — Telephone Encounter (Signed)
Send pantoprazole x1 p.o. daily, 1 year supply.

## 2021-12-06 NOTE — Addendum Note (Signed)
Addended byDamita Dunnings D on: 12/06/2021 03:52 PM   Modules accepted: Orders

## 2021-12-06 NOTE — Telephone Encounter (Signed)
Rx sent 

## 2021-12-28 ENCOUNTER — Encounter: Payer: Self-pay | Admitting: Internal Medicine

## 2021-12-30 DIAGNOSIS — H35371 Puckering of macula, right eye: Secondary | ICD-10-CM | POA: Diagnosis not present

## 2021-12-30 DIAGNOSIS — H59811 Chorioretinal scars after surgery for detachment, right eye: Secondary | ICD-10-CM | POA: Diagnosis not present

## 2021-12-30 DIAGNOSIS — Z961 Presence of intraocular lens: Secondary | ICD-10-CM | POA: Diagnosis not present

## 2022-01-17 ENCOUNTER — Other Ambulatory Visit: Payer: Self-pay | Admitting: Internal Medicine

## 2022-01-30 ENCOUNTER — Encounter: Payer: Self-pay | Admitting: Internal Medicine

## 2022-01-30 ENCOUNTER — Ambulatory Visit (INDEPENDENT_AMBULATORY_CARE_PROVIDER_SITE_OTHER): Payer: Medicare Other | Admitting: Internal Medicine

## 2022-01-30 VITALS — BP 136/74 | HR 67 | Temp 97.7°F | Resp 16 | Ht 65.0 in | Wt 189.0 lb

## 2022-01-30 DIAGNOSIS — E538 Deficiency of other specified B group vitamins: Secondary | ICD-10-CM | POA: Diagnosis not present

## 2022-01-30 DIAGNOSIS — R972 Elevated prostate specific antigen [PSA]: Secondary | ICD-10-CM | POA: Diagnosis not present

## 2022-01-30 DIAGNOSIS — K219 Gastro-esophageal reflux disease without esophagitis: Secondary | ICD-10-CM

## 2022-01-30 DIAGNOSIS — E785 Hyperlipidemia, unspecified: Secondary | ICD-10-CM

## 2022-01-30 DIAGNOSIS — Z86010 Personal history of colonic polyps: Secondary | ICD-10-CM | POA: Diagnosis not present

## 2022-01-30 LAB — COMPREHENSIVE METABOLIC PANEL
ALT: 36 U/L (ref 0–53)
AST: 24 U/L (ref 0–37)
Albumin: 4.3 g/dL (ref 3.5–5.2)
Alkaline Phosphatase: 53 U/L (ref 39–117)
BUN: 14 mg/dL (ref 6–23)
CO2: 29 mEq/L (ref 19–32)
Calcium: 9.6 mg/dL (ref 8.4–10.5)
Chloride: 104 mEq/L (ref 96–112)
Creatinine, Ser: 0.98 mg/dL (ref 0.40–1.50)
GFR: 74.81 mL/min (ref >60.00)
Glucose, Bld: 94 mg/dL (ref 70–99)
Potassium: 4.3 mEq/L (ref 3.5–5.1)
Sodium: 140 mEq/L (ref 135–145)
Total Bilirubin: 0.8 mg/dL (ref 0.2–1.2)
Total Protein: 6.8 g/dL (ref 6.0–8.3)

## 2022-01-30 LAB — CBC WITH DIFFERENTIAL/PLATELET
Basophils Absolute: 0 10*3/uL (ref 0.0–0.1)
Basophils Relative: 0.5 % (ref 0.0–3.0)
Eosinophils Absolute: 0.1 10*3/uL (ref 0.0–0.7)
Eosinophils Relative: 2.3 % (ref 0.0–5.0)
HCT: 47.4 % (ref 39.0–52.0)
Hemoglobin: 16.2 g/dL (ref 13.0–17.0)
Lymphocytes Relative: 27.7 % (ref 12.0–46.0)
Lymphs Abs: 1.6 10*3/uL (ref 0.7–4.0)
MCHC: 34.1 g/dL (ref 30.0–36.0)
MCV: 93.9 fl (ref 78.0–100.0)
Monocytes Absolute: 0.5 10*3/uL (ref 0.1–1.0)
Monocytes Relative: 8.9 % (ref 3.0–12.0)
Neutro Abs: 3.5 10*3/uL (ref 1.4–7.7)
Neutrophils Relative %: 60.6 % (ref 43.0–77.0)
Platelets: 202 10*3/uL (ref 150.0–400.0)
RBC: 5.04 Mil/uL (ref 4.22–5.81)
RDW: 13.2 % (ref 11.5–15.5)
WBC: 5.8 10*3/uL (ref 4.0–10.5)

## 2022-01-30 LAB — LIPID PANEL
Cholesterol: 179 mg/dL (ref 0–200)
HDL: 58.8 mg/dL (ref >39.00)
LDL Cholesterol: 107 mg/dL — ABNORMAL HIGH (ref 0–99)
NonHDL: 120.48
Total CHOL/HDL Ratio: 3
Triglycerides: 67 mg/dL (ref 0.0–149.0)
VLDL: 13.4 mg/dL (ref 0.0–40.0)

## 2022-01-30 LAB — B12 AND FOLATE PANEL
Folate: >23.8 ng/mL (ref >5.9)
Vitamin B-12: 535 pg/mL (ref 211–911)

## 2022-01-30 NOTE — Patient Instructions (Addendum)
Please bring [place] a copy of your Healthcare Power of Attorney for your chart.       GO TO THE LAB : Get the blood work     [place], [place] Come back for a routine visit in 1 year

## 2022-01-30 NOTE — Assessment & Plan Note (Signed)
-  Td 2015 - s/p RSV - s/p had a zostavax & shingrix - PNM 23:  2012; prevnar 2015; PNM 20: 2023 -  covid vax:  UTD  - had a  Flu shot -CCS:  Colonoscopy:  , 06/27/2005 , 09-2010---->  1 polyp, cscope 11-2015, neg, Cscope 01-2021, next per GI -Prostate cancer screening:  per urology -Advance care planning: see AVS

## 2022-01-30 NOTE — Assessment & Plan Note (Addendum)
Hyperlipidemia: On atorvastatin, checking labs GERD: On chronic PPIs, previously symptoms not well-controlled with a lower dose.  Continue PPIs, check a B12 Allergies: Controlled Increased PSA: Per urology H/o colon polyps: check CBC Social: Lives at [facility], enjoying the retirement community Preventive care: Reviewed RTC 1 year

## 2022-01-30 NOTE — Progress Notes (Signed)
Subjective:    Patient ID: Rick Campbell, male    DOB: 02-Oct-1945, 77 y.o.   MRN: [id number]  DOS:  01/30/2022 Type of visit - description: Routine office visit  Chronic medical problems reviewed. In general feeling well. Denies chest pain or difficulty breathing. Denies dysuria, gross hematuria, hardly ever has nocturia. Denies nausea vomiting or blood in the stools.  Review of Systems See above   Past Medical History:  Diagnosis Date   Adenomatous polyp of colon    Allergy    seasonal   Cataract    Chronic headache    Esophageal reflux    Hyperlipidemia    Lower urinary tract symptoms (LUTS)     Past Surgical History:  Procedure Laterality Date   CHOLECYSTECTOMY  1998   EYE SURGERY Bilateral 11/27/2017   cataract and retina surgery   POLYPECTOMY     Social History   Socioeconomic History   Marital status: Married    Spouse name: Not on file   Number of children: 2   Years of education: Not on file   Highest education level: Not on file  Occupational History   Occupation: retired- Education administrator    Comment: Marketing  Tobacco Use   Smoking status: Never   Smokeless tobacco: Never  Vaping Use   Vaping Use: Never used  Substance and Sexual Activity   Alcohol use: Yes    Comment: 1 or 2 glasses of wine every week   Drug use: No   Sexual activity: Yes  Other Topics Concern   Not on file  Social History Narrative   retired from [employer]  , lives w/ wife , both children in [place]   4 g-children         Social Determinants of Health   Financial Resource Strain: [place]  (07/09/2020)   Overall Financial Resource Strain (CARDIA)    Difficulty of Paying Living Expenses: Not hard at all  [place]: No [place] (07/09/2020)   Hunger Vital Sign    Worried About Short in the Last Year: Never true    [place] in the Last Year: Never true  Transportation Needs: No Transportation Needs (07/09/2020)   PRAPARE - Radiographer, therapeutic (Medical): No    Lack of Transportation (Non-Medical): No  Physical Activity: Sufficiently Active (07/09/2020)   Exercise Vital Sign    Days of Exercise per Week: 4 days    Minutes of Exercise per Session: 60 min  Stress: No Stress Concern Present (07/09/2020)   [place]    Feeling of Stress : Not at all  Social Connections: [place] (07/09/2020)   Social Connection and Isolation Panel [NHANES]    Frequency of Communication with Friends and Family: More than three times a week    Frequency of Social Gatherings with Friends and Family: More than three times a week    Attends Religious Services: More than 4 times per year    Active Member of [employer] or Organizations: Yes    Attends Archivist Meetings: More than 4 times per year    Marital Status: Married  Human resources officer Violence: Not At Risk (07/09/2020)   Humiliation, Afraid, Rape, and Kick questionnaire    Fear of Current or Ex-Partner: No    Emotionally Abused: No    Physically Abused: No    Sexually Abused: No    Current Outpatient Medications  Medication Instructions   atorvastatin (LIPITOR) 20 mg, Oral, Daily   Multiple Vitamins-Minerals (SENIOR MULTIVITAMIN PLUS PO) 1 tablet, Oral, Daily,     pantoprazole (PROTONIX) 40 mg, Oral, Daily before breakfast   tamsulosin (FLOMAX) 0.4 mg, Oral, Daily after supper       Objective:   Physical Exam BP 136/74   Pulse 67   Temp 97.7 F (36.5 C) (Oral)   Resp 16   Ht '5\' 5"'$  (1.651 m)   Wt 189 lb (85.7 kg)   SpO2 98%   BMI 31.45 kg/m  General: Well developed, NAD, BMI noted Neck: No  thyromegaly  HEENT:  Normocephalic . Face symmetric, atraumatic Lungs:  CTA B Normal respiratory effort, no intercostal retractions, no accessory muscle use. Heart: RRR,  no murmur.  Abdomen:  Not distended, soft, non-tender. No rebound or rigidity.   Lower extremities: no pretibial edema  bilaterally  Skin: Exposed areas without rash. Not pale. Not jaundice Neurologic:  alert & oriented X3.  Speech normal, gait appropriate for age and unassisted Strength symmetric and appropriate for age.  Psych: Cognition and judgment appear intact.  Cooperative with normal attention span and concentration.  Behavior appropriate. No anxious or depressed appearing.     Assessment     Assessment Hyperlipidemia GERD Seasonal allergies Obesity H/o increased PSA , LUTS, sees urology Headaches, chronic    PLAN Hyperlipidemia: On atorvastatin, checking labs GERD: On chronic PPIs, previously symptoms not well-controlled with a lower dose.  Continue PPIs, check a B12 Allergies: Controlled Increased PSA: Per urology H/o colon polyps: check CBC Social: Lives at [facility], enjoying the retirement community Preventive care: Reviewed RTC 1 year

## 2022-02-13 DIAGNOSIS — D2239 Melanocytic nevi of other parts of face: Secondary | ICD-10-CM | POA: Diagnosis not present

## 2022-02-13 DIAGNOSIS — D225 Melanocytic nevi of trunk: Secondary | ICD-10-CM | POA: Diagnosis not present

## 2022-02-13 DIAGNOSIS — D2272 Melanocytic nevi of left lower limb, including hip: Secondary | ICD-10-CM | POA: Diagnosis not present

## 2022-02-13 DIAGNOSIS — L821 Other seborrheic keratosis: Secondary | ICD-10-CM | POA: Diagnosis not present

## 2022-02-13 DIAGNOSIS — D220 Melanocytic nevi of lip: Secondary | ICD-10-CM | POA: Diagnosis not present

## 2022-05-25 ENCOUNTER — Other Ambulatory Visit: Payer: Self-pay | Admitting: Internal Medicine

## 2022-05-31 ENCOUNTER — Other Ambulatory Visit: Payer: Self-pay | Admitting: Internal Medicine

## 2022-06-25 ENCOUNTER — Other Ambulatory Visit: Payer: Self-pay | Admitting: Family Medicine

## 2022-06-25 DIAGNOSIS — J0140 Acute pansinusitis, unspecified: Secondary | ICD-10-CM

## 2022-07-18 ENCOUNTER — Ambulatory Visit (INDEPENDENT_AMBULATORY_CARE_PROVIDER_SITE_OTHER): Payer: Medicare Other | Admitting: *Deleted

## 2022-07-18 VITALS — BP 137/71 | HR 62 | Ht 65.0 in | Wt 185.4 lb

## 2022-07-18 DIAGNOSIS — Z0000 Encounter for general adult medical examination without abnormal findings: Secondary | ICD-10-CM

## 2022-07-18 NOTE — Patient Instructions (Signed)
Rick Campbell , Thank you for taking time to come for your Medicare Wellness Visit. I appreciate your ongoing commitment to your health goals. Please review the following plan we discussed and let me know if I can assist you in the future.   These are the goals we discussed:  Goals      maintain current healthy lifestyle        This is a list of the screening recommended for you and due dates:  Health Maintenance  Topic Date Due   COVID-19 Vaccine (7 - 2023-24 season) 07/31/2022*   Flu Shot  08/28/2022   Medicare Annual Wellness Visit  07/18/2023   DTaP/Tdap/Td vaccine (3 - Tdap) 08/11/2023   Colon Cancer Screening  02/16/2024   Pneumonia Vaccine  Completed   Hepatitis C Screening  Completed   Zoster (Shingles) Vaccine  Completed   HPV Vaccine  Aged Out  *Topic was postponed. The date shown is not the original due date.     Next appointment: Follow up in one year for your annual wellness visit.   Preventive Care 42 Years and Older, Male Preventive care refers to lifestyle choices and visits with your health care provider that can promote health and wellness. What does preventive care include? A yearly physical exam. This is also called an annual well check. Dental exams once or twice a year. Routine eye exams. Ask your health care provider how often you should have your eyes checked. Personal lifestyle choices, including: Daily care of your teeth and gums. Regular physical activity. Eating a healthy diet. Avoiding tobacco and drug use. Limiting alcohol use. Practicing safe sex. Taking low doses of aspirin every day. Taking vitamin and mineral supplements as recommended by your health care provider. What happens during an annual well check? The services and screenings done by your health care provider during your annual well check will depend on your age, overall health, lifestyle risk factors, and family history of disease. Counseling  Your health care provider may ask you  questions about your: Alcohol use. Tobacco use. Drug use. Emotional well-being. Home and relationship well-being. Sexual activity. Eating habits. History of falls. Memory and ability to understand (cognition). Work and work Astronomer. Screening  You may have the following tests or measurements: Height, weight, and BMI. Blood pressure. Lipid and cholesterol levels. These may be checked every 5 years, or more frequently if you are over 71 years old. Skin check. Lung cancer screening. You may have this screening every year starting at age 28 if you have a 30-pack-year history of smoking and currently smoke or have quit within the past 15 years. Fecal occult blood test (FOBT) of the stool. You may have this test every year starting at age 28. Flexible sigmoidoscopy or colonoscopy. You may have a sigmoidoscopy every 5 years or a colonoscopy every 10 years starting at age 73. Prostate cancer screening. Recommendations will vary depending on your family history and other risks. Hepatitis C blood test. Hepatitis B blood test. Sexually transmitted disease (STD) testing. Diabetes screening. This is done by checking your blood sugar (glucose) after you have not eaten for a while (fasting). You may have this done every 1-3 years. Abdominal aortic aneurysm (AAA) screening. You may need this if you are a current or former smoker. Osteoporosis. You may be screened starting at age 32 if you are at high risk. Talk with your health care provider about your test results, treatment options, and if necessary, the need for more tests. Vaccines  Your health care provider may recommend certain vaccines, such as: Influenza vaccine. This is recommended every year. Tetanus, diphtheria, and acellular pertussis (Tdap, Td) vaccine. You may need a Td booster every 10 years. Zoster vaccine. You may need this after age 53. Pneumococcal 13-valent conjugate (PCV13) vaccine. One dose is recommended after age  69. Pneumococcal polysaccharide (PPSV23) vaccine. One dose is recommended after age 18. Talk to your health care provider about which screenings and vaccines you need and how often you need them. This information is not intended to replace advice given to you by your health care provider. Make sure you discuss any questions you have with your health care provider. Document Released: 02/09/2015 Document Revised: 10/03/2015 Document Reviewed: 11/14/2014 Elsevier Interactive Patient Education  2017 [employer].  Fall Prevention in the Home Falls can cause injuries. They can happen to people of all ages. There are many things you can do to make your home safe and to help prevent falls. What can I do on the outside of my home? Regularly fix the edges of walkways and driveways and fix any cracks. Remove anything that might make you trip as you walk through a door, such as a raised step or threshold. Trim any bushes or trees on the path to your home. Use bright outdoor lighting. Clear any walking paths of anything that might make someone trip, such as rocks or tools. Regularly check to see if handrails are loose or broken. Make sure that both sides of any steps have handrails. Any raised decks and porches should have guardrails on the edges. Have any leaves, snow, or ice cleared regularly. Use sand or salt on walking paths during winter. Clean up any spills in your garage right away. This includes oil or grease spills. What can I do in the bathroom? Use night lights. Install grab bars by the toilet and in the tub and shower. Do not use towel bars as grab bars. Use non-skid mats or decals in the tub or shower. If you need to sit down in the shower, use a plastic, non-slip stool. Keep the floor dry. Clean up any water that spills on the floor as soon as it happens. Remove soap buildup in the tub or shower regularly. Attach bath mats securely with double-sided non-slip rug tape. Do not have throw  rugs and other things on the floor that can make you trip. What can I do in the bedroom? Use night lights. Make sure that you have a light by your bed that is easy to reach. Do not use any sheets or blankets that are too big for your bed. They should not hang down onto the floor. Have a firm chair that has side arms. You can use this for support while you get dressed. Do not have throw rugs and other things on the floor that can make you trip. What can I do in the kitchen? Clean up any spills right away. Avoid walking on wet floors. Keep items that you use a lot in easy-to-reach places. If you need to reach something above you, use a strong step stool that has a grab bar. Keep electrical cords out of the way. Do not use floor polish or wax that makes floors slippery. If you must use wax, use non-skid floor wax. Do not have throw rugs and other things on the floor that can make you trip. What can I do with my stairs? Do not leave any items on the stairs. Make sure that there are  handrails on both sides of the stairs and use them. Fix handrails that are broken or loose. Make sure that handrails are as long as the stairways. Check any carpeting to make sure that it is firmly attached to the stairs. Fix any carpet that is loose or worn. Avoid having throw rugs at the top or bottom of the stairs. If you do have throw rugs, attach them to the floor with carpet tape. Make sure that you have a light switch at the top of the stairs and the bottom of the stairs. If you do not have them, ask someone to add them for you. What else can I do to help prevent falls? Wear shoes that: Do not have high heels. Have rubber bottoms. Are comfortable and fit you well. Are closed at the toe. Do not wear sandals. If you use a stepladder: Make sure that it is fully opened. Do not climb a closed stepladder. Make sure that both sides of the stepladder are locked into place. Ask someone to hold it for you, if  possible. Clearly mark and make sure that you can see: Any grab bars or handrails. First and last steps. Where the edge of each step is. Use tools that help you move around (mobility aids) if they are needed. These include: Canes. Walkers. Scooters. Crutches. Turn on the lights when you go into a dark area. Replace any light bulbs as soon as they burn out. Set up your furniture so you have a clear path. Avoid moving your furniture around. If any of your floors are uneven, fix them. If there are any pets around you, be aware of where they are. Review your medicines with your doctor. Some medicines can make you feel dizzy. This can increase your chance of falling. Ask your doctor what other things that you can do to help prevent falls. This information is not intended to replace advice given to you by your health care provider. Make sure you discuss any questions you have with your health care provider. Document Released: 11/09/2008 Document Revised: 06/21/2015 Document Reviewed: 02/17/2014 Elsevier Interactive Patient Education  2017 [employer].

## 2022-07-18 NOTE — Progress Notes (Signed)
Subjective:   Rick Campbell is a 77 y.o. male who presents for Medicare Annual/Subsequent preventive examination.  Visit Complete: In person  Patient Medicare AWV questionnaire was completed by the patient on 07/11/22; I have confirmed that all information answered by patient is correct and no changes since this date.  Review of Systems     Cardiac Risk Factors include: male gender;advanced age (>95men, >1 women);dyslipidemia     Objective:    Today's Vitals   07/18/22 0821  BP: 137/71  Pulse: 62  Weight: 185 lb 6.4 oz (84.1 kg)  Height: 5\' 5"  (1.651 m)   Body mass index is 30.85 kg/m.     07/18/2022    8:22 AM 07/15/2021    8:09 AM 07/09/2020    8:20 AM 07/05/2019    9:35 AM 07/02/2018    9:04 AM 06/30/2017    2:17 PM  Advanced Directives  Does Patient Have a Medical Advance Directive? Yes Yes Yes Yes Yes Yes  Type of Estate agent of [place];Living will Healthcare Power of [place];Out of facility DNR (pink MOST or yellow form);Living will Healthcare Power of [place];Living will Healthcare Power of [employer];Living will Healthcare Power of [place];Living will Healthcare Power of [place];Living will  Does patient want to make changes to medical advance directive? No - Patient declined No - Patient declined  No - Patient declined No - Patient declined   Copy of Healthcare Power of Attorney in Chart? No - copy requested No - copy requested No - copy requested No - copy requested No - copy requested No - copy requested    Current Medications (verified) Outpatient Encounter Medications as of 07/18/2022  Medication Sig   atorvastatin (LIPITOR) 20 MG tablet Take 1 tablet (20 mg total) by mouth daily.   Multiple Vitamins-Minerals (SENIOR MULTIVITAMIN PLUS PO) Take 1 tablet by mouth daily.   pantoprazole (PROTONIX) 40 MG tablet Take 1 tablet (40 mg total) by mouth daily before breakfast.   tamsulosin (FLOMAX) 0.4 MG CAPS capsule Take 1 capsule (0.4 mg total)  by mouth daily after supper.   No facility-administered encounter medications on file as of 07/18/2022.    Allergies (verified) Sulfonamide derivatives   History: Past Medical History:  Diagnosis Date   Adenomatous polyp of colon    Allergy    seasonal   Cataract    Chronic headache    Esophageal reflux    Hyperlipidemia    Lower urinary tract symptoms (LUTS)    Past Surgical History:  Procedure Laterality Date   CHOLECYSTECTOMY  1998   EYE SURGERY Bilateral 11/27/2017   cataract and retina surgery   POLYPECTOMY     Family History  Problem Relation Age of Onset   Lung cancer Mother        was a heavy smoker   Colon cancer Father        dx age 92s   Diabetes Father    Heart Problems Father        father CHF in his 23s   COPD Neg Hx    Arthritis Neg Hx    Prostate cancer Neg Hx    Esophageal cancer Neg Hx    Stomach cancer Neg Hx    Rectal cancer Neg Hx    Social History   Socioeconomic History   Marital status: Married    Spouse name: Not on file   Number of children: 2   Years of education: Not on file   Highest education level: Not  on file  Occupational History   Occupation: retired- Engineer, production    Comment: Marketing  Tobacco Use   Smoking status: Never   Smokeless tobacco: Never  Vaping Use   Vaping Use: Never used  Substance and Sexual Activity   Alcohol use: Yes    Alcohol/week: 2.0 standard drinks of alcohol    Types: 2 Glasses of wine per week    Comment: Glass of wine; weekend.  2/week   Drug use: No   Sexual activity: Yes  Other Topics Concern   Not on file  Social History Narrative   retired from [employer]  , lives w/ wife , both children in [place]   4 g-children         Social Determinants of Health   Financial Resource Strain: Low Risk  (07/11/2022)   Overall Financial Resource Strain (CARDIA)    Difficulty of Paying Living Expenses: Not hard at all  Food Insecurity: No Food Insecurity (07/11/2022)   Hunger Vital Sign    Worried About  Running Out of Food in the Last Year: Never true    Ran Out of Food in the Last Year: Never true  Transportation Needs: No Transportation Needs (07/11/2022)   PRAPARE - Administrator, Civil Service (Medical): No    Lack of Transportation (Non-Medical): No  Physical Activity: Sufficiently Active (07/11/2022)   Exercise Vital Sign    Days of Exercise per Week: 5 days    Minutes of Exercise per Session: 50 min  Stress: No Stress Concern Present (07/11/2022)   [employer] of Occupational Health - Occupational Stress Questionnaire    Feeling of Stress : Not at all  Social Connections: Unknown (07/11/2022)   Social Connection and Isolation Panel [NHANES]    Frequency of Communication with Friends and Family: More than three times a week    Frequency of Social Gatherings with Friends and Family: More than three times a week    Attends Religious Services: Not on Marketing executive or Organizations: Yes    Attends Engineer, structural: More than 4 times per year    Marital Status: Married    Tobacco Counseling Counseling given: Not Answered   Clinical Intake:  Pre-visit preparation completed: Yes  Pain : No/denies pain  BMI - recorded: 30.85 Nutritional Status: BMI > 30  Obese Nutritional Risks: None Diabetes: No  How often do you need to have someone help you when you read instructions, pamphlets, or other written materials from your doctor or pharmacy?: 1 - Never  Interpreter Needed?: No  Information entered by :: Donne Anon, CMA   Activities of Daily Living    07/11/2022    9:53 AM  In your present state of health, do you have any difficulty performing the following activities:  Hearing? 0  Vision? 0  Difficulty concentrating or making decisions? 0  Walking or climbing stairs? 0  Dressing or bathing? 0  Doing errands, shopping? 0  Preparing Food and eating ? N  Using the Toilet? N  In the past six months, have you accidently  leaked urine? N  Do you have problems with loss of bowel control? N  Managing your Medications? N  Managing your Finances? N  Housekeeping or managing your Housekeeping? N    Patient Care Team: Wanda Plump, MD as PCP - General (Internal Medicine) Donzetta Starch, MD as Consulting Physician (Dermatology) Nyoka Cowden, MD as Consulting Physician (Pulmonary Disease) Mia Creek, MD as  Consulting Physician (Ophthalmology) Jerilee Field, MD as Consulting Physician (Urology)  Indicate any recent Medical Services you may have received from other than [facility] providers in the past year (date may be approximate).     Assessment:   This is a routine wellness examination for Russel.  Hearing/Vision screen No results found.  Dietary issues and exercise activities discussed:     Goals Addressed   None    Depression Screen    07/18/2022    8:24 AM 01/30/2022    8:54 AM 07/15/2021    8:12 AM 01/30/2021    8:58 AM 08/03/2020    1:52 PM 07/09/2020    8:21 AM 01/30/2020    8:29 AM  PHQ 2/9 Scores  PHQ - 2 Score 0 0 0 0 0 0 0    Fall Risk    07/11/2022    9:53 AM 01/30/2022    8:54 AM 07/15/2021    8:10 AM 01/30/2021    8:58 AM 08/03/2020    1:52 PM  Fall Risk   Falls in the past year? 0 0 0 0 0  Number falls in past yr: 0 0 0 0 0  Injury with Fall? 0 0 0 0 0  Risk for fall due to : No Fall Risks  No Fall Risks    Follow up Falls evaluation completed Falls evaluation completed Falls evaluation completed Falls evaluation completed Falls evaluation completed    MEDICARE RISK AT HOME:  Medicare Risk at Home - 07/18/22 0823     Any stairs in or around the home? No    If so, are there any without handrails? No    Home free of loose throw rugs in walkways, pet beds, electrical cords, etc? Yes    Adequate lighting in your home to reduce risk of falls? Yes    Life alert? No    Use of a cane, walker or w/c? No    Grab bars in the bathroom? No    Shower chair or bench in shower? Yes   built in  seat   Elevated toilet seat or a handicapped toilet? Yes             TIMED UP AND GO:  Was the test performed?  Yes  Length of time to ambulate 10 feet: 6 sec Gait steady and fast without use of assistive device    Cognitive Function:        07/18/2022    8:25 AM 07/15/2021    8:18 AM  6CIT Screen  What Year? 0 points 0 points  What month? 0 points 0 points  What time? 0 points 0 points  Count back from 20 0 points 0 points  Months in reverse 0 points 0 points  Repeat phrase 0 points 0 points  Total Score 0 points 0 points    Immunizations Immunization History  Administered Date(s) Administered   Fluad Quad(high Dose 65+) 10/12/2018   Influenza Split 11/26/2010   Influenza, High Dose Seasonal PF 11/07/2014, 11/14/2015, 11/12/2016, 11/17/2017   Influenza,inj,Quad PF,6+ Mos 11/22/2013   Influenza-Unspecified 11/27/2012, 10/17/2019, 11/01/2020, 10/29/2021   PFIZER(Purple Top)SARS-COV-2 Vaccination 02/22/2019, 03/15/2019, 10/24/2019, 05/28/2020   PNEUMOCOCCAL CONJUGATE-20 01/30/2021   Pfizer Covid-19 Vaccine Bivalent Booster 67yrs & up 11/01/2020, 10/29/2021   Pneumococcal Conjugate-13 08/10/2013   Pneumococcal Polysaccharide-23 06/21/2010   RSV,unspecified 12/27/2021   Td 09/15/2003   Tetanus 08/10/2013   Zoster Recombinat (Shingrix) 06/02/2017, 08/06/2017   Zoster, Live 09/10/2009    TDAP status: Up to date  Flu Vaccine status: Up to date  Pneumococcal vaccine status: Up to date  Covid-19 vaccine status: Information provided on how to obtain vaccines.   Qualifies for Shingles Vaccine? Yes   Zostavax completed Yes   Shingrix Completed?: Yes  Screening Tests Health Maintenance  Topic Date Due   Medicare Annual Wellness (AWV)  07/16/2022   COVID-19 Vaccine (7 - 2023-24 season) 07/31/2022 (Originally 12/24/2021)   INFLUENZA VACCINE  08/28/2022   DTaP/Tdap/Td (3 - Tdap) 08/11/2023   Colonoscopy  02/16/2024   Pneumonia Vaccine 61+ Years old  Completed    Hepatitis C Screening  Completed   Zoster Vaccines- Shingrix  Completed   HPV VACCINES  Aged Out    Health Maintenance  Health Maintenance Due  Topic Date Due   Medicare Annual Wellness (AWV)  07/16/2022    Colorectal cancer screening: Type of screening: Colonoscopy. Completed 02/15/21. Repeat every 3 years  Lung Cancer Screening: (Low Dose CT Chest recommended if Age 77-80 years, 20 pack-year currently smoking OR have quit w/in 15years.) does not qualify.   Additional Screening:  Hepatitis C Screening: does qualify; Completed 11/07/14  Vision Screening: Recommended annual ophthalmology exams for early detection of glaucoma and other disorders of the eye. Is the patient up to date with their annual eye exam?  Yes  Who is the provider or what is the name of the office in which the patient attends annual eye exams? [facility] If pt is not established with a provider, would they like to be referred to a provider to establish care? No .   Dental Screening: Recommended annual dental exams for proper oral hygiene  Diabetic Foot Exam: N/a  Community Resource Referral / Chronic Care Management: CRR required this visit?  No   CCM required this visit?  No     Plan:     I have personally reviewed and noted the following in the patient's chart:   Medical and social history Use of alcohol, tobacco or illicit drugs  Current medications and supplements including opioid prescriptions. Patient is not currently taking opioid prescriptions. Functional ability and status Nutritional status Physical activity Advanced directives List of other physicians Hospitalizations, surgeries, and ER visits in previous 12 months Vitals Screenings to include cognitive, depression, and falls Referrals and appointments  In addition, I have reviewed and discussed with patient certain preventive protocols, quality metrics, and best practice recommendations. A written personalized care plan for  preventive services as well as general preventive health recommendations were provided to patient.     Donne Anon, CMA   07/18/2022   After Visit Summary: Sent to mychart  Nurse Notes: None

## 2022-07-20 ENCOUNTER — Other Ambulatory Visit: Payer: Self-pay | Admitting: Internal Medicine

## 2022-08-14 DIAGNOSIS — R972 Elevated prostate specific antigen [PSA]: Secondary | ICD-10-CM | POA: Diagnosis not present

## 2022-08-18 ENCOUNTER — Other Ambulatory Visit: Payer: Self-pay | Admitting: Internal Medicine

## 2022-08-21 DIAGNOSIS — N401 Enlarged prostate with lower urinary tract symptoms: Secondary | ICD-10-CM | POA: Diagnosis not present

## 2022-08-21 DIAGNOSIS — R3912 Poor urinary stream: Secondary | ICD-10-CM | POA: Diagnosis not present

## 2022-08-21 DIAGNOSIS — R972 Elevated prostate specific antigen [PSA]: Secondary | ICD-10-CM | POA: Diagnosis not present

## 2022-09-23 ENCOUNTER — Encounter: Payer: Self-pay | Admitting: Internal Medicine

## 2022-09-23 ENCOUNTER — Ambulatory Visit (INDEPENDENT_AMBULATORY_CARE_PROVIDER_SITE_OTHER): Payer: Medicare Other | Admitting: Internal Medicine

## 2022-09-23 VITALS — BP 138/60 | HR 71 | Temp 98.5°F | Resp 16 | Ht 65.0 in | Wt 187.5 lb

## 2022-09-23 DIAGNOSIS — R051 Acute cough: Secondary | ICD-10-CM | POA: Diagnosis not present

## 2022-09-23 MED ORDER — BENZONATATE 200 MG PO CAPS: 200.0000 mg | ORAL_CAPSULE | Freq: Three times a day (TID) | ORAL | 0 refills | Status: DC | PRN

## 2022-09-23 MED ORDER — AZITHROMYCIN 250 MG PO TABS: ORAL_TABLET | ORAL | 0 refills | Status: DC

## 2022-09-23 NOTE — Patient Instructions (Signed)
Continue Astelin nasal spray  Stop Mucinex D.  Try Mucinex DM.  In addition you can take Tessalon Perles 3 times a day as needed.  For the next 2 weeks, increase pantoprazole 40 mg to 1 tablet twice daily before meals.  Use the antibiotic prescription, Zithromax, only if you are not gradually improving in the next few days.  If you are not completely back to normal in 10 days to 2 weeks please let us know.  Call anytime if your symptoms become severe

## 2022-09-23 NOTE — Progress Notes (Unsigned)
   Subjective:    Patient ID: Rick Campbell, male    DOB: 02-Jun-1945, 77 y.o.   MRN: [id number]  DOS:  09/23/2022 Type of visit - description: Acute  Gradual onset of symptoms about 3 weeks ago. Sinus headache, dry cough triggered by talking. + Postnasal dripping but unable to blow any discharge from the nose.  Denies fever or chills. No chest pain or difficulty breathing. No other household members affected. Denies myalgias, nausea vomiting.  No diarrhea. GERD symptoms slightly increased lately but not severe.  Has been taking several medication including Mucinex D, Delsym etc.   Review of Systems See above   Past Medical History:  Diagnosis Date   Adenomatous polyp of colon    Allergy    seasonal   Cataract    Chronic headache    Esophageal reflux    Hyperlipidemia    Lower urinary tract symptoms (LUTS)     Past Surgical History:  Procedure Laterality Date   CHOLECYSTECTOMY  1998   EYE SURGERY Bilateral 11/27/2017   cataract and retina surgery   POLYPECTOMY      Current Outpatient Medications  Medication Instructions   atorvastatin (LIPITOR) 20 mg, Oral, Daily   Multiple Vitamins-Minerals (SENIOR MULTIVITAMIN PLUS PO) 1 tablet, Oral, Daily,     pantoprazole (PROTONIX) 40 mg, Oral, Daily before breakfast   tamsulosin (FLOMAX) 0.4 mg, Oral, Daily after supper       Objective:   Physical Exam BP 138/60   Pulse 71   Temp 98.5 F (36.9 C) (Oral)   Resp 16   Ht 5\' 5"  (1.651 m)   Wt 187 lb 8 oz (85 kg)   SpO2 96%   BMI 31.20 kg/m  General:   Well developed, NAD, BMI noted. Frequent cough noted, typically after he start talking. HEENT:  Normocephalic . Face symmetric, atraumatic TMs: Slightly bulged.  Sinuses non-TTP.  Nose not congested. Lungs:  CTA B Normal respiratory effort, no intercostal retractions, no accessory muscle use. Heart: RRR,  no murmur.  Lower extremities: no pretibial edema bilaterally  Skin: Not pale. Not jaundice Neurologic:   alert & oriented X3.  Speech normal, gait appropriate for age and unassisted Psych--  Cognition and judgment appear intact.  Cooperative with normal attention span and concentration.  Behavior appropriate. No anxious or depressed appearing.      Assessment     Assessment Hyperlipidemia GERD Seasonal allergies Obesity H/o increased PSA , LUTS, sees urology Headaches, chronic   PLAN Cough: As described above, lungs are clear, no fever or chills.  GERD symptoms slightly increased lately. Etiology is not completely clear. Plan: Increase pantoprazole to twice daily for 2 weeks, continue Astepro, change OTCs to Mucinex DM, add Tessalon Perles as needed. If not better start a Z-Pak. If not back to normal in 10 days to 2 weeks, he will let me know.  This could be reactive airway disease, has a history of Symbicort use in the past.  Consider a round of prednisone, Symbicort, x-ray, etc.

## 2022-09-24 NOTE — Assessment & Plan Note (Signed)
Cough: As described above, lungs are clear, no fever or chills.  GERD symptoms slightly increased lately. Etiology is not completely clear. Plan: Increase pantoprazole to twice daily for 2 weeks, continue Astepro, change OTCs to Mucinex DM, add Tessalon Perles as needed. If not better start a Z-Pak. If not back to normal in 10 days to 2 weeks, he will let me know.  This could be reactive airway disease, has a history of Symbicort use in the past.  Consider a round of prednisone, Symbicort, x-ray, etc.

## 2022-10-13 ENCOUNTER — Ambulatory Visit (INDEPENDENT_AMBULATORY_CARE_PROVIDER_SITE_OTHER): Payer: Medicare Other | Admitting: Internal Medicine

## 2022-10-13 ENCOUNTER — Ambulatory Visit (HOSPITAL_BASED_OUTPATIENT_CLINIC_OR_DEPARTMENT_OTHER)
Admission: RE | Admit: 2022-10-13 | Discharge: 2022-10-13 | Disposition: A | Discharge status: Home / Self Care | Payer: Medicare Other | Source: Ambulatory Visit | Attending: Internal Medicine | Admitting: Internal Medicine

## 2022-10-13 ENCOUNTER — Encounter: Payer: Self-pay | Admitting: Internal Medicine

## 2022-10-13 VITALS — BP 126/78 | HR 65 | Temp 97.8°F | Resp 16 | Ht 65.0 in | Wt 187.4 lb

## 2022-10-13 DIAGNOSIS — R051 Acute cough: Secondary | ICD-10-CM

## 2022-10-13 DIAGNOSIS — R059 Cough, unspecified: Secondary | ICD-10-CM | POA: Diagnosis not present

## 2022-10-13 MED ORDER — PREDNISONE 10 MG PO TABS: ORAL_TABLET | ORAL | 0 refills | Status: DC

## 2022-10-13 MED ORDER — BUDESONIDE-FORMOTEROL FUMARATE 160-4.5 MCG/ACT IN AERO: 2.0000 | INHALATION_SPRAY | Freq: Two times a day (BID) | RESPIRATORY_TRACT | 3 refills | Status: DC

## 2022-10-13 MED ORDER — BENZONATATE 200 MG PO CAPS: 200.0000 mg | ORAL_CAPSULE | Freq: Three times a day (TID) | ORAL | 0 refills | Status: DC | PRN

## 2022-10-13 NOTE — Assessment & Plan Note (Signed)
Cough: See LOV, since then felt better for a couple of days but is back with respiratory symptoms. Did not get to take a Z-Pak. 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 Continue Astepro Mucinex [employer]. Use antibiotic if not better Call if not better.

## 2022-10-13 NOTE — Progress Notes (Signed)
Subjective:    Patient ID: Rick Campbell, male    DOB: 03-Mar-1945, 77 y.o.   MRN: [id number]  DOS:  10/13/2022 Type of visit - description: acute  Symptoms started ~ 5 weeks ago, was seen at this office 09/23/2022, he follow all the instructions provided except taking the antibiotics. He is here because symptoms are about the same: Persistent dry cough, no fever or chills. No sputum production. + Sinus congestion but no nasal discharge.  Review of Systems See above   Past Medical History:  Diagnosis Date   Adenomatous polyp of colon    Allergy    seasonal   Cataract    Chronic headache    Esophageal reflux    Hyperlipidemia    Lower urinary tract symptoms (LUTS)     Past Surgical History:  Procedure Laterality Date   CHOLECYSTECTOMY  1998   EYE SURGERY Bilateral 11/27/2017   cataract and retina surgery   POLYPECTOMY      Current Outpatient Medications  Medication Instructions   atorvastatin (LIPITOR) 20 mg, Oral, Daily   azithromycin (ZITHROMAX Z-PAK) 250 MG tablet 2 tabs a day the first day, then 1 tab a day x 4 days   benzonatate (TESSALON) 200 mg, Oral, 3 times daily PRN   Multiple Vitamins-Minerals (SENIOR MULTIVITAMIN PLUS PO) 1 tablet, Oral, Daily,     pantoprazole (PROTONIX) 40 mg, Oral, Daily before breakfast   tamsulosin (FLOMAX) 0.4 mg, Oral, Daily after supper       Objective:   Physical Exam BP 126/78   Pulse 65   Temp 97.8 F (36.6 C) (Oral)   Resp 16   Ht 5\' 5"  (1.651 m)   Wt 187 lb 6 oz (85 kg)   SpO2 97%   BMI 31.18 kg/m  General:   Well developed, NAD, BMI noted. HEENT:  Normocephalic . Face symmetric, atraumatic. Throat symmetric, TMs normal. Lungs:  CTA B Normal respiratory effort, no intercostal retractions, no accessory muscle use. Heart: RRR,  no murmur.  Lower extremities: no pretibial edema bilaterally  Skin: Not pale. Not jaundice Neurologic:  alert & oriented X3.  Speech normal, gait appropriate for age and  unassisted Psych--  Cognition and judgment appear intact.  Cooperative with normal attention span and concentration.  Behavior appropriate. No anxious or depressed appearing.      Assessment     Assessment Hyperlipidemia GERD Seasonal allergies Obesity H/o increased PSA , LUTS, sees urology Headaches, chronic   PLAN Cough: See LOV, since then felt better for a couple of days but is back with respiratory symptoms. Did not get to take a Z-Pak. 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 Continue Astepro Mucinex [employer]. Use antibiotic if not better Call if not better.

## 2022-10-13 NOTE — Patient Instructions (Signed)
Get a chest x-ray at the first floor.  Continue pantoprazole twice daily  Continue nasal sprays, Mucinex DM, Tessalon Perles as needed  Start prednisone for few days   Symbicort 2 puffs twice daily for 1 month, and then as needed.  Consider taking a Zithromax.  Call if not gradually better

## 2022-10-16 ENCOUNTER — Encounter: Payer: Self-pay | Admitting: Internal Medicine

## 2022-10-24 ENCOUNTER — Encounter: Payer: Self-pay | Admitting: Internal Medicine

## 2022-10-24 DIAGNOSIS — Z23 Encounter for immunization: Secondary | ICD-10-CM | POA: Diagnosis not present

## 2022-10-28 ENCOUNTER — Other Ambulatory Visit (HOSPITAL_COMMUNITY): Payer: Self-pay

## 2022-10-28 ENCOUNTER — Telehealth: Payer: Self-pay

## 2022-10-28 NOTE — Telephone Encounter (Signed)
Pharmacy Patient Advocate Encounter   Received notification from CoverMyMeds that prior authorization for Budesonide-Formoterol Fumarate 160-4.5MCG/ACT aerosol is required/requested.   Insurance verification completed.   The patient is insured through [facility] .   Per test claim: PA required; PA submitted to [facility] via CoverMyMeds Key/confirmation #/EOC [id number] Status is pending

## 2022-10-29 NOTE — Telephone Encounter (Signed)
PA denied. Covered alternatives: Earlie Server, Dulera HFA, Advair HFA. Please advise.

## 2022-10-30 ENCOUNTER — Other Ambulatory Visit: Payer: Self-pay | Admitting: Family

## 2022-10-30 MED ORDER — MOMETASONE FURO-FORMOTEROL FUM 100-5 MCG/ACT IN AERO: 2.0000 | INHALATION_SPRAY | Freq: Two times a day (BID) | RESPIRATORY_TRACT | 3 refills | Status: DC

## 2022-10-30 NOTE — Telephone Encounter (Signed)
Eulis Foster, FNP  [employer] [facility] (7:49 AM)    Changed Symbicort to [facility]. Sent to the pharmacy

## 2023-01-05 ENCOUNTER — Encounter: Payer: Self-pay | Admitting: Internal Medicine

## 2023-01-05 ENCOUNTER — Ambulatory Visit (INDEPENDENT_AMBULATORY_CARE_PROVIDER_SITE_OTHER): Payer: Medicare Other | Admitting: Internal Medicine

## 2023-01-05 VITALS — BP 126/84 | HR 51 | Temp 98.3°F | Resp 16 | Ht 65.0 in | Wt 188.1 lb

## 2023-01-05 DIAGNOSIS — R052 Subacute cough: Secondary | ICD-10-CM | POA: Diagnosis not present

## 2023-01-05 MED ORDER — FLUTICASONE FUROATE-VILANTEROL 100-25 MCG/ACT IN AEPB: 1.0000 | INHALATION_SPRAY | Freq: Every day | RESPIRATORY_TRACT | 3 refills | Status: DC

## 2023-01-05 MED ORDER — BENZONATATE 200 MG PO CAPS: 200.0000 mg | ORAL_CAPSULE | Freq: Three times a day (TID) | ORAL | 1 refills | Status: DC | PRN

## 2023-01-05 NOTE — Patient Instructions (Addendum)
We are referring you to the pulmonary doctors  I sent a prescription for Breo, 1 puff daily.  Take Astepro over-the-counter: 2 sprays twice daily  I refilled benzonatate to take as needed for cough.  See you next month

## 2023-01-05 NOTE — Progress Notes (Unsigned)
Subjective:    Patient ID: Rick Campbell, male    DOB: 1945-11-15, 77 y.o.   MRN: [id number]  DOS:  01/05/2023 Type of visit - description: Acute  Ongoing symptoms started in July, he was evaluated here in August and September. Despite treatment he is basically feeling the same way. Cough, dry, persistent from time. Sinus pressure and sinus headache. Occasional hoarseness.  No fever chills No chest pain or difficulty breathing No wheezing per se No heartburn, on PPIs.  Review of Systems See above   Past Medical History:  Diagnosis Date   Adenomatous polyp of colon    Allergy    seasonal   Cataract    Chronic headache    Esophageal reflux    Hyperlipidemia    Lower urinary tract symptoms (LUTS)     Past Surgical History:  Procedure Laterality Date   CHOLECYSTECTOMY  1998   EYE SURGERY Bilateral 11/27/2017   cataract and retina surgery   POLYPECTOMY      Current Outpatient Medications  Medication Instructions   atorvastatin (LIPITOR) 20 mg, Oral, Daily   azithromycin (ZITHROMAX Z-PAK) 250 MG tablet 2 tabs a day the first day, then 1 tab a day x 4 days   benzonatate (TESSALON) 200 mg, Oral, 3 times daily PRN   mometasone-formoterol (DULERA) 100-5 MCG/ACT AERO 2 puffs, Inhalation, 2 times daily   Multiple Vitamins-Minerals (SENIOR MULTIVITAMIN PLUS PO) 1 tablet, Oral, Daily,     pantoprazole (PROTONIX) 40 mg, Oral, Daily before breakfast   predniSONE (DELTASONE) 10 MG tablet 4 tablets x 2 days, 3 tabs x 2 days, 2 tabs x 2 days, 1 tab x 2 days   tamsulosin (FLOMAX) 0.4 mg, Oral, Daily after supper       Objective:   Physical Exam BP 126/84   Pulse (!) 51   Temp 98.3 F (36.8 C) (Oral)   Resp 16   Ht 5\' 5"  (1.651 m)   Wt 188 lb 2 oz (85.3 kg)   SpO2 97%   BMI 31.31 kg/m  General:   Well developed, NAD, BMI noted. HEENT:  Normocephalic . Face symmetric, atraumatic TMs normal.  Nose slightly congested Lungs:  CTA B Normal respiratory effort, no  intercostal retractions, no accessory muscle use. Heart: RRR, trace systolic murmur.  Lower extremities: no pretibial edema bilaterally  Skin: Not pale. Not jaundice Neurologic:  alert & oriented X3.  Speech normal, gait appropriate for age and unassisted Psych--  Cognition and judgment appear intact.  Cooperative with normal attention span and concentration.  Behavior appropriate. No anxious or depressed appearing.      Assessment     Assessment Hyperlipidemia GERD Seasonal allergies Obesity H/o increased PSA , LUTS, sees urology Headaches, chronic   PLAN Cough: Symptoms started in July 2024, seen here in August and  September. The one thing he is doing different is that he moved to a new house in November 2023 Since I see him first in August, he temporarily increase PPIs to twice daily without obvious help Took prednisone temporarily with some help Had a chest x-ray which was normal Took an antibiotic without much help Was unable to get inhalers due to cost Plan: Refer to pulmonary, cough variant asthma ? (history of reactive airway disease) Continue PPIs Symbicort and Dulera were not covered, try Breo as suggested by his insurance Continue Astepro. Trace systolic murmur?  Onto the exam he appears to have systolic murmur, observation RTC scheduled for 01/2023   Cough: See LOV,  since then felt better for a couple of days but is back with respiratory symptoms. Did not get to take a Z-Pak. 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  Continue Astepro Mucinex [employer]. Use antibiotic if not better Call if not better.

## 2023-01-06 ENCOUNTER — Other Ambulatory Visit: Payer: Self-pay

## 2023-01-06 MED ORDER — BREO ELLIPTA 100-25 MCG/ACT IN AEPB: 1.0000 | INHALATION_SPRAY | Freq: Every day | RESPIRATORY_TRACT | 3 refills | Status: DC

## 2023-01-06 NOTE — Assessment & Plan Note (Signed)
Cough: Symptoms started in July 2024, seen here in August and  September. The one thing is different is that he moved to a new house in November 2023, is new environment triggering allergies-asthma?  Since I see him first in August, he temporarily increase PPIs to twice daily without obvious help Took prednisone temporarily with some help Had a chest x-ray which was normal Took an antibiotic without much help Was unable to get inhalers due to cost Plan: Refer to pulmonary, cough variant asthma ? (history of reactive airway disease) Continue PPIs Symbicort and Dulera were not covered, try Breo as suggested by his insurance Continue Astepro. Trace systolic murmur?  [facility] e exam he appears to have soft systolic murmur, observation RTC scheduled for 01/2023

## 2023-02-02 ENCOUNTER — Ambulatory Visit: Payer: Medicare Other | Admitting: Internal Medicine

## 2023-02-05 DIAGNOSIS — D2239 Melanocytic nevi of other parts of face: Secondary | ICD-10-CM | POA: Diagnosis not present

## 2023-02-05 DIAGNOSIS — L821 Other seborrheic keratosis: Secondary | ICD-10-CM | POA: Diagnosis not present

## 2023-02-05 DIAGNOSIS — L570 Actinic keratosis: Secondary | ICD-10-CM | POA: Diagnosis not present

## 2023-02-05 DIAGNOSIS — D225 Melanocytic nevi of trunk: Secondary | ICD-10-CM | POA: Diagnosis not present

## 2023-02-05 DIAGNOSIS — H61001 Unspecified perichondritis of right external ear: Secondary | ICD-10-CM | POA: Diagnosis not present

## 2023-02-05 DIAGNOSIS — D2272 Melanocytic nevi of left lower limb, including hip: Secondary | ICD-10-CM | POA: Diagnosis not present

## 2023-02-09 ENCOUNTER — Ambulatory Visit (INDEPENDENT_AMBULATORY_CARE_PROVIDER_SITE_OTHER): Payer: Medicare Other | Admitting: Internal Medicine

## 2023-02-09 ENCOUNTER — Encounter: Payer: Self-pay | Admitting: Internal Medicine

## 2023-02-09 VITALS — BP 126/80 | HR 72 | Temp 97.9°F | Resp 16 | Ht 65.0 in | Wt 183.4 lb

## 2023-02-09 DIAGNOSIS — E785 Hyperlipidemia, unspecified: Secondary | ICD-10-CM

## 2023-02-09 DIAGNOSIS — K219 Gastro-esophageal reflux disease without esophagitis: Secondary | ICD-10-CM

## 2023-02-09 DIAGNOSIS — J4520 Mild intermittent asthma, uncomplicated: Secondary | ICD-10-CM | POA: Diagnosis not present

## 2023-02-09 DIAGNOSIS — Z0000 Encounter for general adult medical examination without abnormal findings: Secondary | ICD-10-CM

## 2023-02-09 LAB — COMPREHENSIVE METABOLIC PANEL
ALT: 36 U/L (ref 0–53)
AST: 25 U/L (ref 0–37)
Albumin: 4.3 g/dL (ref 3.5–5.2)
Alkaline Phosphatase: 57 U/L (ref 39–117)
BUN: 12 mg/dL (ref 6–23)
CO2: 29 meq/L (ref 19–32)
Calcium: 9.4 mg/dL (ref 8.4–10.5)
Chloride: 104 meq/L (ref 96–112)
Creatinine, Ser: 0.9 mg/dL (ref 0.40–1.50)
GFR: 82.26 mL/min (ref >60.00)
Glucose, Bld: 91 mg/dL (ref 70–99)
Potassium: 4.2 meq/L (ref 3.5–5.1)
Sodium: 140 meq/L (ref 135–145)
Total Bilirubin: 0.8 mg/dL (ref 0.2–1.2)
Total Protein: 6.5 g/dL (ref 6.0–8.3)

## 2023-02-09 LAB — CBC WITH DIFFERENTIAL/PLATELET
Basophils Absolute: 0 10*3/uL (ref 0.0–0.1)
Basophils Relative: 0.6 % (ref 0.0–3.0)
Eosinophils Absolute: 0.1 10*3/uL (ref 0.0–0.7)
Eosinophils Relative: 1.4 % (ref 0.0–5.0)
HCT: 48.2 % (ref 39.0–52.0)
Hemoglobin: 16.1 g/dL (ref 13.0–17.0)
Lymphocytes Relative: 27.4 % (ref 12.0–46.0)
Lymphs Abs: 1.4 10*3/uL (ref 0.7–4.0)
MCHC: 33.4 g/dL (ref 30.0–36.0)
MCV: 94.8 fL (ref 78.0–100.0)
Monocytes Absolute: 0.4 10*3/uL (ref 0.1–1.0)
Monocytes Relative: 7.4 % (ref 3.0–12.0)
Neutro Abs: 3.3 10*3/uL (ref 1.4–7.7)
Neutrophils Relative %: 63.2 % (ref 43.0–77.0)
Platelets: 179 10*3/uL (ref 150.0–400.0)
RBC: 5.09 Mil/uL (ref 4.22–5.81)
RDW: 13.5 % (ref 11.5–15.5)
WBC: 5.3 10*3/uL (ref 4.0–10.5)

## 2023-02-09 LAB — LIPID PANEL
Cholesterol: 165 mg/dL (ref 0–200)
HDL: 54.1 mg/dL (ref >39.00)
LDL Cholesterol: 95 mg/dL (ref 0–99)
NonHDL: 110.98
Total CHOL/HDL Ratio: 3
Triglycerides: 79 mg/dL (ref 0.0–149.0)
VLDL: 15.8 mg/dL (ref 0.0–40.0)

## 2023-02-09 NOTE — Progress Notes (Signed)
   Subjective:    Patient ID: Rick Campbell, male    DOB: 08/17/1945, 78 y.o.   MRN: [id number]  DOS:  02/09/2023 Type of visit - description: Follow-up  Chronic medical problems addressed. Was recently seen with cough, he thinks Breo make a huge difference.  Currently with no symptoms.  Denies fever No chest pain or difficulty breathing No GI or GU symptoms   Review of Systems See above   Past Medical History:  Diagnosis Date   Adenomatous polyp of colon    Allergy     seasonal   Cataract    Chronic headache    Esophageal reflux    Hyperlipidemia    Lower urinary tract symptoms (LUTS)     Past Surgical History:  Procedure Laterality Date   CHOLECYSTECTOMY  1998   EYE SURGERY Bilateral 11/27/2017   cataract and retina surgery   POLYPECTOMY      Current Outpatient Medications  Medication Instructions   atorvastatin  (LIPITOR) 20 mg, Oral, Daily   benzonatate  (TESSALON ) 200 mg, Oral, 3 times daily PRN   BREO ELLIPTA  100-25 MCG/ACT AEPB 1 puff, Inhalation, Daily   Multiple Vitamins-Minerals (SENIOR MULTIVITAMIN PLUS PO) 1 tablet, Daily   pantoprazole  (PROTONIX ) 40 mg, Oral, Daily before breakfast   tamsulosin  (FLOMAX ) 0.4 mg, Oral, Daily after supper       Objective:   Physical Exam BP 126/80   Pulse 72   Temp 97.9 F (36.6 C) (Oral)   Resp 16   Ht 5' 5 (1.651 m)   Wt 183 lb 6 oz (83.2 kg)   SpO2 98%   BMI 30.52 kg/m  General: Well developed, NAD, BMI noted Neck: No  thyromegaly  HEENT:  Normocephalic . Face symmetric, atraumatic Lungs:  CTA B Normal respiratory effort, no intercostal retractions, no accessory muscle use. Heart: RRR,  no murmur.  Abdomen:  Not distended, soft, non-tender. No rebound or rigidity.   Lower extremities: no pretibial edema bilaterally  Skin: Exposed areas without rash. Not pale. Not jaundice Neurologic:  alert & oriented X3.  Speech normal, gait appropriate for age and unassisted Strength symmetric and appropriate  for age.  Psych: Cognition and judgment appear intact.  Cooperative with normal attention span and concentration.  Behavior appropriate. No anxious or depressed appearing.     Assessment      Assessment Hyperlipidemia GERD Seasonal allergies Obesity H/o increased PSA , LUTS, sees urology Headaches, chronic   PLAN High cholesterol: On atorvastatin  20 mg, recommend LDL goal to be <100, checking labs, consider adjusting atorvastatin  dose GERD: Well-controlled, continue pantoprazole , check CBC Reactive airway disease: See LOV, had a persisting cough, sxs improve after took Breo for 2 to 3 weeks.  Wonders if he needs to continue taking it and see pulmonary.  We agreed of tako Breo for 2 to 3 weeks at a time if needed, will cancel pulmonary referral for now. Preventive care reviewed -Td 2015 - s/p RSV - s/p had a zostavax & shingrix - PNM 23:  2012; prevnar 2015; PNM 20: 2023 - had a  Flu shot and covid vax  ~ 09/2022 -Recommend Tdap  -CCS:  Colonoscopy:  , 06/27/2005 , 09-2010---->  1 polyp, cscope 11-2015, neg, Cscope 01-2021, next 3 years per GI letter  -Prostate cancer screening:  per urology - Healthcare POA: See AVS RTC 6 to 8 months

## 2023-02-09 NOTE — Assessment & Plan Note (Signed)
 Preventive care reviewed -Td 2015 - s/p RSV - s/p had a zostavax & shingrix - PNM 23:  2012; prevnar 2015; PNM 20: 2023 - had a  Flu shot and covid vax  ~ 09/2022 -Recommend Tdap  -CCS:  Colonoscopy:  , 06/27/2005 , 09-2010---->  1 polyp, cscope 11-2015, neg, Cscope 01-2021, next 3 years per GI letter  -Prostate cancer screening:  per urology - Healthcare POA: See AVS

## 2023-02-09 NOTE — Patient Instructions (Addendum)
 Vaccines I recommend: Tdap (tetanus shot plus a whooping cough)   GO TO THE LAB : Get the blood work     Next visit with me in 6 to 8 months Please schedule it at the front desk       Health Care Power of attorney ,  Living will (Advance care planning documents)  If you already have a living will or healthcare power of attorney, is recommended you bring the copy to be scanned in your chart.   The document will be available to all the doctors you see in the system.  Advance care planning is a process that supports adults in  understanding and sharing their preferences regarding future medical care.  The patient's preferences are recorded in documents called Advance Directives and the can be modified at any time while the patient is in full mental capacity.   If you don't have one, please consider create one.      More information at: [email or web address]

## 2023-02-09 NOTE — Assessment & Plan Note (Signed)
 High cholesterol: On atorvastatin  20 mg, recommend LDL goal to be <100, checking labs, consider adjusting atorvastatin  dose GERD: Well-controlled, continue pantoprazole , check CBC Reactive airway disease: See LOV, had a persisting cough, sxs improve after took Breo for 2 to 3 weeks.  Wonders if he needs to continue taking it and see pulmonary.  We agreed of tako Breo for 2 to 3 weeks at a time if needed, will cancel pulmonary referral for now. Preventive care reviewed RTC 6 to 8 months

## 2023-02-25 ENCOUNTER — Encounter: Payer: Self-pay | Admitting: Internal Medicine

## 2023-03-05 ENCOUNTER — Institutional Professional Consult (permissible substitution): Payer: Medicare Other | Admitting: Pulmonary Disease

## 2023-05-02 ENCOUNTER — Other Ambulatory Visit: Payer: Self-pay | Admitting: Internal Medicine

## 2023-05-26 ENCOUNTER — Encounter: Payer: Self-pay | Admitting: Internal Medicine

## 2023-05-26 ENCOUNTER — Ambulatory Visit (INDEPENDENT_AMBULATORY_CARE_PROVIDER_SITE_OTHER): Admitting: Internal Medicine

## 2023-05-26 VITALS — BP 126/62 | HR 66 | Temp 97.7°F | Resp 16 | Ht 65.0 in | Wt 187.4 lb

## 2023-05-26 DIAGNOSIS — R490 Dysphonia: Secondary | ICD-10-CM

## 2023-05-26 DIAGNOSIS — B070 Plantar wart: Secondary | ICD-10-CM

## 2023-05-26 NOTE — Patient Instructions (Addendum)
 Will refer you to podiatry for the wart on your left foot.  For hoarseness: Continue Astepro  Continue pantoprazole  Start Flonase 2 sprays on each side of the nose daily Will refer you to a ENT doctor.  See you in June for your routine appointment, sooner if needed.

## 2023-05-26 NOTE — Progress Notes (Unsigned)
   Subjective:    Patient ID: Rick Campbell, male    DOB: Sep 10, 1945, 78 y.o.   MRN: [id number]  DOS:  05/26/2023 Type of visit - description: Acute  His main concern is pain at the left plantar area with walking. Increased with walking sometimes. Denies any injury.    Review of Systems See above   Past Medical History:  Diagnosis Date   Adenomatous polyp of colon    Allergy     seasonal   Cataract    Chronic headache    Esophageal reflux    Hyperlipidemia    Lower urinary tract symptoms (LUTS)     Past Surgical History:  Procedure Laterality Date   CHOLECYSTECTOMY  1998   EYE SURGERY Bilateral 11/27/2017   cataract and retina surgery   POLYPECTOMY      Current Outpatient Medications  Medication Instructions   atorvastatin  (LIPITOR) 20 mg, Oral, Daily   benzonatate  (TESSALON ) 200 mg, Oral, 3 times daily PRN   BREO ELLIPTA  100-25 MCG/ACT AEPB 1 puff, Inhalation, Daily   Multiple Vitamins-Minerals (SENIOR MULTIVITAMIN PLUS PO) 1 tablet, Daily   pantoprazole  (PROTONIX ) 40 mg, Oral, Daily before breakfast   tamsulosin  (FLOMAX ) 0.4 mg, Oral, Daily after supper       Objective:   Physical Exam Musculoskeletal:       Feet:    BP 126/62   Pulse 66   Temp 97.7 F (36.5 C) (Oral)   Resp 16   Ht 5\' 5"  (1.651 m)   Wt 187 lb 6 oz (85 kg)   SpO2 97%   BMI 31.18 kg/m  General:   Well developed, NAD, BMI noted. HEENT:  Normocephalic . Face symmetric, atraumatic   Skin: Not pale. Not jaundice Neurologic:  alert & oriented X3.  Speech normal, gait appropriate for age and unassisted Psych--  Cognition and judgment appear intact.  Cooperative with normal attention span and concentration.  Behavior appropriate. No anxious or depressed appearing.      Assessment    Assessment Hyperlipidemia GERD Seasonal allergies Obesity H/o increased PSA , LUTS, sees urology Headaches, chronic   PLAN Plantar wart: Complaining of left foot pain, suspect related to a  plantar wart or corn.  Referred to podiatry. GERD, reactive airway disease: See previous visits, currently on PPIs with no heartburn, on Breo denies cough. He is having hoarseness on and off.   Suspect hoarseness is due to postnasal dripping,  the patient and his wife are concerned about the issue Plan: Add Flonase, refer to ENT to be sure all is okay. RTC as scheduled for June

## 2023-05-27 NOTE — Assessment & Plan Note (Signed)
 Plantar wart: Complaining of left foot pain, suspect related to a plantar wart or corn.  Refer to podiatry. GERD, reactive airway disease: See previous visits, currently on PPIs with no heartburn, on Breo denies cough. He is having hoarseness on and off.   Suspect hoarseness is due to postnasal dripping,  the patient and his wife are concerned about the issue Plan: Add Flonase, refer to ENT to be sure all is okay. RTC as scheduled for June

## 2023-06-04 ENCOUNTER — Ambulatory Visit (INDEPENDENT_AMBULATORY_CARE_PROVIDER_SITE_OTHER): Admitting: Otolaryngology

## 2023-06-04 VITALS — BP 155/78 | HR 81 | Ht 66.0 in | Wt 183.0 lb

## 2023-06-04 DIAGNOSIS — R053 Chronic cough: Secondary | ICD-10-CM | POA: Diagnosis not present

## 2023-06-04 DIAGNOSIS — R490 Dysphonia: Secondary | ICD-10-CM | POA: Diagnosis not present

## 2023-06-04 DIAGNOSIS — J3089 Other allergic rhinitis: Secondary | ICD-10-CM

## 2023-06-04 DIAGNOSIS — J343 Hypertrophy of nasal turbinates: Secondary | ICD-10-CM

## 2023-06-04 DIAGNOSIS — R0981 Nasal congestion: Secondary | ICD-10-CM

## 2023-06-04 DIAGNOSIS — K219 Gastro-esophageal reflux disease without esophagitis: Secondary | ICD-10-CM | POA: Diagnosis not present

## 2023-06-04 DIAGNOSIS — J383 Other diseases of vocal cords: Secondary | ICD-10-CM

## 2023-06-04 MED ORDER — FLUTICASONE PROPIONATE 50 MCG/ACT NA SUSP: 2.0000 | Freq: Every day | NASAL | 6 refills | Status: DC

## 2023-06-04 MED ORDER — LEVOCETIRIZINE DIHYDROCHLORIDE 5 MG PO TABS
5.0000 mg | ORAL_TABLET | Freq: Every evening | ORAL | 3 refills | Status: DC
Start: 2023-06-04 — End: 2023-10-01

## 2023-06-04 NOTE — Patient Instructions (Signed)

## 2023-06-04 NOTE — Progress Notes (Signed)
 ENT CONSULT:  Reason for Consult: chronic hoarseness, recurrent vs chronic sinusitis x 1-1.5 yrs and dry cough    HPI: Discussed the use of AI scribe software for clinical note transcription with the patient, who gave verbal consent to proceed.  History of Present Illness Rick Campbell is a 78 year old male who presents with hoarseness chronic dry cough. He was referred by Dr. Neomi Banks for evaluation of hoarseness.  He has experienced changes in his voice, including hoarseness and a raspy quality, intermittently over the past year to year and a half. The most recent episode began about a week to a week and a half ago. He describes difficulty speaking, stating that 'something grabs my throat' and he cannot complete a sentence without hacking and clearing his throat.  He also experiences sinus congestion and a dry, hacking cough. No trouble with swallowing or shortness of breath. He walks three to four miles a day and exercises without difficulty.  He has a history of reflux, which he feels is under control, as he does not experience heartburn. He has been prescribed Symbicort , Mucinex , Tessalon  Perles, Astepro , and an inhaler (Breo) in the past, but none have alleviated his symptoms. A chest x-ray was performed and was normal. He was treated with a Z-Pak and another antibiotic for a suspected sinus infection last September or October, but this did not resolve his symptoms.  He was tested for allergies 25 to 30 years ago, which showed allergies to cats and dust. He underwent allergy  shots for four to five years without significant improvement. He notes that exposure to dust, such as when going into the attic, can trigger his symptoms.  He is currently taking Aciphex  for reflux and alternates between using Flonase and Astepro  nasal sprays three to four times a day.   Records Reviewed:  Office visit with Dr Neomi Banks 10/13/22 Hyperlipidemia GERD Seasonal allergies Obesity H/o increased PSA , LUTS, sees  urology Headaches, chronic   PLAN Cough: See LOV, since then felt better for a couple of days but is back with respiratory symptoms. Did not get to take a Z-Pak. He has a history of reactive airway disease, current symptoms could be bronchospasm Plan: CXR Prednisone  and Symbicort  for possible coughvariant asthma, Symbicort  technique discussed. Continue Astepro  Mucinex  Tessalon  Perles. Use antibiotic if not better Call if not better.   Past Medical History:  Diagnosis Date   Adenomatous polyp of colon    Allergy     seasonal   Cataract    Chronic headache    Esophageal reflux    Hyperlipidemia    Lower urinary tract symptoms (LUTS)     Past Surgical History:  Procedure Laterality Date   CHOLECYSTECTOMY  1998   EYE SURGERY Bilateral 11/27/2017   cataract and retina surgery   POLYPECTOMY      Family History  Problem Relation Age of Onset   Lung cancer Mother        was a heavy smoker   Colon cancer Father        dx age 96s   Diabetes Father    Heart Problems Father        father CHF in his 41s   COPD Neg Hx    Arthritis Neg Hx    Prostate cancer Neg Hx    Esophageal cancer Neg Hx    Stomach cancer Neg Hx    Rectal cancer Neg Hx     Social History:  reports that he has never smoked. He has  never used smokeless tobacco. He reports current alcohol use of about 2.0 standard drinks of alcohol per week. He reports that he does not use drugs.  Allergies:  Allergies  Allergen Reactions   Sulfonamide Derivatives Hives and Swelling    Urticaria    Medications: I have reviewed the patient's current medications.  The PMH, PSH, Medications, Allergies, and SH were reviewed and updated.  ROS: Constitutional: Negative for fever, weight loss and weight gain. Cardiovascular: Negative for chest pain and dyspnea on exertion. Respiratory: Is not experiencing shortness of breath at rest. Gastrointestinal: Negative for nausea and vomiting. Neurological: Negative for  headaches. Psychiatric: The patient is not nervous/anxious  Blood pressure (!) 155/78, pulse 81, height 5\' 6"  (1.676 m), weight 183 lb (83 kg), SpO2 97%. Body mass index is 29.54 kg/m.  PHYSICAL EXAM:  Exam: General: Well-developed, well-nourished Communication and Voice: Clear pitch and clarity Respiratory Respiratory effort: Equal inspiration and expiration without stridor Cardiovascular Peripheral Vascular: Warm extremities with equal color/perfusion Eyes: No nystagmus with equal extraocular motion bilaterally Neuro/Psych/Balance: Patient oriented to person, place, and time; Appropriate mood and affect; Gait is intact with no imbalance; Cranial nerves I-XII are intact Head and Face Inspection: Normocephalic and atraumatic without mass or lesion Palpation: Facial skeleton intact without bony stepoffs Salivary Glands: No mass or tenderness Facial Strength: Facial motility symmetric and full bilaterally ENT Pinna: External ear intact and fully developed External canal: Canal is patent with intact skin Tympanic Membrane: Clear and mobile External Nose: No scar or anatomic deformity Internal Nose: Septum is deviated to the left. No polyp, or purulence. Mucosal edema and erythema present.  Bilateral inferior turbinate hypertrophy.  Lips, Teeth, and gums: Mucosa and teeth intact and viable TMJ: No pain to palpation with full mobility Oral cavity/oropharynx: No erythema or exudate, no lesions present Nasopharynx: No mass or lesion with intact mucosa Hypopharynx: Intact mucosa without pooling of secretions Larynx Glottic: Full true vocal cord mobility without lesion or mass VF atrophy bilateral Supraglottic: Normal appearing epiglottis and AE folds Interarytenoid Space: Moderate pachydermia&edema Subglottic Space: Patent without lesion or edema Neck Neck and Trachea: Midline trachea without mass or lesion Thyroid : No mass or nodularity Lymphatics: No  lymphadenopathy  Procedure: Preoperative diagnosis: chronic cough and hoarseness   Postoperative diagnosis:   Same + VF atrophy + GERD LPR and PND  Procedure: Flexible fiberoptic laryngoscopy  Surgeon: Zoraida Havrilla, MD  Anesthesia: Topical lidocaine and Afrin Complications: None Condition is stable throughout exam  Indications and consent:  The patient presents to the clinic with above symptoms. Indirect laryngoscopy view was incomplete. Thus it was recommended that they undergo a flexible fiberoptic laryngoscopy. All of the risks, benefits, and potential complications were reviewed with the patient preoperatively and verbal informed consent was obtained.  Procedure: The patient was seated upright in the clinic. Topical lidocaine and Afrin were applied to the nasal cavity. After adequate anesthesia had occurred, I then proceeded to pass the flexible telescope into the nasal cavity. The nasal cavity was patent without rhinorrhea or polyp. The nasopharynx was also patent without mass or lesion. The base of tongue was visualized and was normal. There were no signs of pooling of secretions in the piriform sinuses. The true vocal folds were mobile bilaterally. There were no signs of glottic or supraglottic mucosal lesion or mass. There was moderate interarytenoid pachydermia and post cricoid edema. The telescope was then slowly withdrawn and the patient tolerated the procedure throughout.     PROCEDURE NOTE: nasal endoscopy  Preoperative  diagnosis: chronic sinusitis symptoms  Postoperative diagnosis: same  Procedure: Diagnostic nasal endoscopy (16109)  Surgeon: Artice Last, M.D.  Anesthesia: Topical lidocaine and Afrin  H&P REVIEW: The patient's history and physical were reviewed today prior to procedure. All medications were reviewed and updated as well. Complications: None Condition is stable throughout exam Indications and consent: The patient presents with symptoms of  chronic sinusitis not responding to previous therapies. All the risks, benefits, and potential complications were reviewed with the patient preoperatively and informed consent was obtained. The time out was completed with confirmation of the correct procedure.   Procedure: The patient was seated upright in the clinic. Topical lidocaine and Afrin were applied to the nasal cavity. After adequate anesthesia had occurred, the rigid nasal endoscope was passed into the nasal cavity. The nasal mucosa, turbinates, septum, and sinus drainage pathways were visualized bilaterally. This revealed no purulence or significant secretions that might be cultured. There were no polyps or sites of significant inflammation. The mucosa was intact and there was no crusting present. The scope was then slowly withdrawn and the patient tolerated the procedure well. There were no complications or blood loss.    Studies Reviewed: CXR 10/24/22 FINDINGS: The heart size and mediastinal contours are within normal limits. Both lungs are clear. The visualized skeletal structures are unremarkable.   IMPRESSION: No active cardiopulmonary disease.  04/15/10 CT max face      Assessment/Plan: Encounter Diagnoses  Name Primary?   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 Vocal cords are mobile and without lesions on flex scope today but there was evidence of VF atrophy and glottic insufficiency, likely due to age-related muscle loss. He also had post-cricoid edema or postnasal drainage. No masses or tumors. - Offer speech therapy to optimize lung support and voice projection. - medical  management of GERD and PND  Chronic cough Chronic dry cough possibly related to postnasal drainage, untreated reflux, or neurogenic cough. Previous asthma treatments ineffective. No lung disease hx or swallowing issues. Neurogenic cough considered if other treatments fail. - Continue Aciphex  for reflux management. - Provided dietary and lifestyle modification information to minimize reflux. - Consider Reflux Gourmet supplement after meals - Refer to pulmonary specialist for pulmonary function testing - Consider superior laryngeal nerve block or medications like tramadol , gabapentin, or amitriptyline if cough persists.  Chronic nasal congestion and suspected environmental allergies (was tested before and had allergy  shots) Last CT max/face with clear paranasal sinuses in 2012 and evidence of ITH. Low suspicion for CRS, but will consider CT sinuses in the future.  We discussed medical management of allergies - continue nasal spray and antihistamine   GERD LPR Reflux well-managed with Aciphex , but potential contribution to chronic cough and voice changes due to silent non-acid reflux. No heartburn.  - Continue Aciphex  for reflux management. - Provided dietary and lifestyle modification information to minimize reflux. - Consider Reflux Gourmet supplement to form a barrier and reduce reflux.  Pulm referral Speech therapy  RTC 3 mo - will consider SLN block when he returns   Thank you for allowing me to participate in the care of this patient. Please  do not hesitate to contact me with any questions or concerns.   Artice Last, MD Otolaryngology [facility] Phone: [phone] Fax: [phone]    06/04/2023, 1:40 PM

## 2023-06-05 ENCOUNTER — Ambulatory Visit (INDEPENDENT_AMBULATORY_CARE_PROVIDER_SITE_OTHER): Admitting: Podiatry

## 2023-06-05 DIAGNOSIS — D2372 Other benign neoplasm of skin of left lower limb, including hip: Secondary | ICD-10-CM | POA: Diagnosis not present

## 2023-06-05 NOTE — Progress Notes (Signed)
  Subjective:  Patient ID: Rick Campbell, male    DOB: 1945-12-26,   MRN: [id number]  No chief complaint on file.   78 y.o. male presents for concern of lesion on the left foot that has been present for 4-5 weeks. Relates it has been very painful. States he has had lesions in the past that have gone away but this one has not improved.   . Denies any other pedal complaints. Denies n/v/f/c.    Past Medical History:  Diagnosis Date   Adenomatous polyp of colon    Allergy     seasonal   Cataract    Chronic headache    Esophageal reflux    Hyperlipidemia    Lower urinary tract symptoms (LUTS)     Objective:  Physical Exam: Vascular: DP/PT pulses 2/4 bilateral. CFT <3 seconds. Normal hair growth on digits. No edema.  Skin. No lacerations or abrasions bilateral feet. Hyperkeratoitc cored lesion noted sub fifht metatarsal head on the left with disruption of skin lines Musculoskeletal: MMT 5/5 bilateral lower extremities in DF, PF, Inversion and Eversion. Deceased ROM in DF of ankle joint.  Neurological: Sensation intact to light touch.   Assessment:   1. Benign neoplasm of skin of foot, left      Plan:  Patient was evaluated and treated and all questions answered. -Discussed benign skin lesions with patient and treatment options.  -Hyperkeratotic tissue was debrided with chisel without incident.  -Applied salycylic acid treatment to area with dressing. Advised to remove bandaging tomorrow.  -Encouraged daily moisturizing -Discussed use of pumice stone -Advised good supportive shoes and inserts -Patient to return to office as needed or sooner if condition worsens.   Jennefer Moats, DPM

## 2023-07-21 ENCOUNTER — Ambulatory Visit (INDEPENDENT_AMBULATORY_CARE_PROVIDER_SITE_OTHER): Payer: Medicare Other | Admitting: *Deleted

## 2023-07-21 VITALS — BP 124/50 | HR 66 | Temp 98.1°F | Resp 16 | Ht 65.0 in | Wt 186.2 lb

## 2023-07-21 DIAGNOSIS — Z0000 Encounter for general adult medical examination without abnormal findings: Secondary | ICD-10-CM | POA: Diagnosis not present

## 2023-07-21 NOTE — Progress Notes (Signed)
I have reviewed and agree with [facility] documentation.  Kathlene November, MD

## 2023-07-21 NOTE — Patient Instructions (Signed)
 Rick Campbell , Thank you for taking time out of your busy schedule to complete your Annual Wellness Visit with me. I enjoyed our conversation and look forward to speaking with you again next year. I, as well as your care team,  appreciate your ongoing commitment to your health goals. Please review the following plan we discussed and let me know if I can assist you in the future. Your Game plan/ To Do List    Follow up Visits: Next Medicare AWV with our clinical staff:   07/21/24 8:20am Next Office Visit with your provider: 09/09/23 10am Dr Amon  Clinician Recommendations:  Aim for 30 minutes of exercise or brisk walking, 6-8 glasses of water, and 5 servings of fruits and vegetables each day.       This is a list of the screening recommended for you and due dates:  Health Maintenance  Topic Date Due   Medicare Annual Wellness Visit  07/18/2023   COVID-19 Vaccine (8 - 2024-25 season) 08/28/2023*   Flu Shot  08/28/2023   Colon Cancer Screening  02/16/2024   DTaP/Tdap/Td vaccine (4 - Td or Tdap) 02/18/2033   Pneumococcal Vaccine for age over 58  Completed   Hepatitis C Screening  Completed   Zoster (Shingles) Vaccine  Completed   Hepatitis B Vaccine  Aged Out   HPV Vaccine  Aged Out   Meningitis B Vaccine  Aged Out  *Topic was postponed. The date shown is not the original due date.    Advanced directives: (Copy Requested) Please bring a copy of your health care power of attorney and living will to the office to be added to your chart at your convenience. You can mail to [facility] [address] [place], [place] [address] or email to [email or web address] Advance Care Planning is important because it:  [x]  Makes sure you receive the medical care that is consistent with your values, goals, and preferences  [x]  It provides guidance to your family and loved ones and reduces their decisional burden about whether or not they are making the right decisions based on your  wishes.  Follow the link provided in your after visit summary or read over the paperwork we have mailed to you to help you started getting your Advance Directives in place. If you need assistance in completing these, please reach out to us  so that we can help you!  See attachments for Preventive Care Tips.

## 2023-07-21 NOTE — Progress Notes (Signed)
 Subjective:   Rick Campbell is a 78 y.o. who presents for a Medicare Wellness preventive visit.  As a reminder, Annual Wellness Visits don't include a physical exam, and some assessments may be limited, especially if this visit is performed virtually. We may recommend an in-person follow-up visit with your provider if needed.  Visit Complete: In person  Persons Participating in Visit: Patient.  AWV Questionnaire: Yes: Patient Medicare AWV questionnaire was completed by the patient on 07/14/23; I have confirmed that all information answered by patient is correct and no changes since this date.  Cardiac Risk Factors include: advanced age (>77men, >34 women);dyslipidemia;male gender     Objective:    Today's Vitals   07/21/23 0814  BP: (!) 124/50  Pulse: 66  Resp: 16  Temp: 98.1 F (36.7 C)  TempSrc: Oral  SpO2: 99%  Weight: 186 lb 3.2 oz (84.5 kg)  Height: 5' 5 (1.651 m)   Body mass index is 30.99 kg/m.     07/21/2023    8:23 AM 07/18/2022    8:22 AM 07/15/2021    8:09 AM 07/09/2020    8:20 AM 07/05/2019    9:35 AM 07/02/2018    9:04 AM 06/30/2017    2:17 PM  Advanced Directives  Does Patient Have a Medical Advance Directive? Yes Yes Yes Yes Yes Yes Yes   Type of Estate agent of [place];Living will Healthcare Power of [place];Living will Healthcare Power of [place];Out of facility DNR (pink MOST or yellow form);Living will Healthcare Power of [place];Living will Healthcare Power of [place];Living will Healthcare Power of [place];Living will Healthcare Power of [place];Living will  Does patient want to make changes to medical advance directive? No - Patient declined No - Patient declined No - Patient declined  No - Patient declined No - Patient declined    Copy of Healthcare Power of Attorney in Chart? No - copy requested No - copy requested No - copy requested No - copy requested No - copy requested No - copy requested  No - copy requested      Data  saved with a previous flowsheet row definition    Current Medications (verified) Outpatient Encounter Medications as of 07/21/2023  Medication Sig   atorvastatin  (LIPITOR) 20 MG tablet Take 1 tablet (20 mg total) by mouth daily.   BREO ELLIPTA  100-25 MCG/ACT AEPB Inhale 1 puff into the lungs daily. (Patient taking differently: Inhale 1 puff into the lungs as needed. For emergency per pt)   fluticasone  (FLONASE ) 50 MCG/ACT nasal spray Place 2 sprays into both nostrils daily.   levocetirizine (XYZAL  ALLERGY  24HR) 5 MG tablet Take 1 tablet (5 mg total) by mouth every evening.   Multiple Vitamins-Minerals (SENIOR MULTIVITAMIN PLUS PO) Take 1 tablet by mouth daily.   pantoprazole  (PROTONIX ) 40 MG tablet Take 1 tablet (40 mg total) by mouth daily before breakfast.   tamsulosin  (FLOMAX ) 0.4 MG CAPS capsule Take 1 capsule (0.4 mg total) by mouth daily after supper.   No facility-administered encounter medications on file as of 07/21/2023.    Allergies (verified) Sulfonamide derivatives   History: Past Medical History:  Diagnosis Date   Adenomatous polyp of colon    Allergy     seasonal   Cataract    Chronic headache    Esophageal reflux    Hyperlipidemia    Lower urinary tract symptoms (LUTS)    Past Surgical History:  Procedure Laterality Date   CHOLECYSTECTOMY  1998   EYE SURGERY Bilateral 11/27/2017  cataract and retina surgery   POLYPECTOMY     Family History  Problem Relation Age of Onset   Lung cancer Mother        was a heavy smoker   Colon cancer Father        dx age 20s   Diabetes Father    Heart Problems Father        father CHF in his 28s   COPD Neg Hx    Arthritis Neg Hx    Prostate cancer Neg Hx    Esophageal cancer Neg Hx    Stomach cancer Neg Hx    Rectal cancer Neg Hx    Social History   Socioeconomic History   Marital status: Married    Spouse name: Not on file   Number of children: 2   Years of education: Not on file   Highest education level:  Master's degree (e.g., MA, MS, MEng, MEd, MSW, MBA)  Occupational History   Occupation: retired- Engineer, production    Comment: Marketing  Tobacco Use   Smoking status: Never   Smokeless tobacco: Never  Vaping Use   Vaping status: Never Used  Substance and Sexual Activity   Alcohol use: Yes    Alcohol/week: 2.0 standard drinks of alcohol    Types: 2 Glasses of wine per week    Comment: Glass of wine; weekend.  2/week   Drug use: No   Sexual activity: Yes  Other Topics Concern   Not on file  Social History Narrative   retired from [employer]  , lives w/ wife , both children in [place]   4 g-children         Social Drivers of Health   Financial Resource Strain: Low Risk  (07/21/2023)   Overall Financial Resource Strain (CARDIA)    Difficulty of Paying Living Expenses: Not hard at all  Food Insecurity: No Food Insecurity (07/21/2023)   Hunger Vital Sign    Worried About Running Out of Food in the Last Year: Never true    Ran Out of Food in the Last Year: Never true  Transportation Needs: No Transportation Needs (07/21/2023)   PRAPARE - Administrator, Civil Service (Medical): No    Lack of Transportation (Non-Medical): No  Physical Activity: Sufficiently Active (07/21/2023)   Exercise Vital Sign    Days of Exercise per Week: 5 days    Minutes of Exercise per Session: 50 min  Stress: No Stress Concern Present (07/21/2023)   [employer] of Occupational Health - Occupational Stress Questionnaire    Feeling of Stress: Not at all  Social Connections: Socially Integrated (07/21/2023)   Social Connection and Isolation Panel    Frequency of Communication with Friends and Family: More than three times a week    Frequency of Social Gatherings with Friends and Family: More than three times a week    Attends Religious Services: More than 4 times per year    Active Member of [employer] or Organizations: Yes    Attends Engineer, structural: More than 4 times per year    Marital  Status: Married    Tobacco Counseling Counseling given: Not Answered    Clinical Intake:  Pre-visit preparation completed: Yes  Pain : No/denies pain     BMI - recorded: 30.99 Nutritional Status: BMI > 30  Obese Nutritional Risks: None Diabetes: No  Lab Results  Component Value Date   HGBA1C 5.3 05/17/2014     How often do you need to have  someone help you when you read instructions, pamphlets, or other written materials from your doctor or pharmacy?: 1 - Never What is the last grade level you completed in school?: master's degree  Interpreter Needed?: No  Information entered by :: Lolita Libra, CMA   Activities of Daily Living     07/14/2023   12:47 PM  In your present state of health, do you have any difficulty performing the following activities:  Hearing? 0  Vision? 0  Difficulty concentrating or making decisions? 0  Walking or climbing stairs? 0  Dressing or bathing? 0  Doing errands, shopping? 0  Preparing Food and eating ? N  Using the Toilet? N  In the past six months, have you accidently leaked urine? N  Do you have problems with loss of bowel control? N  Managing your Medications? N  Managing your Finances? N  Housekeeping or managing your Housekeeping? N    Patient Care Team: Amon Aloysius BRAVO, MD as PCP - General (Internal Medicine) Joshua Blamer, MD as Consulting Physician (Dermatology) Darlean Ozell NOVAK, MD as Consulting Physician (Pulmonary Disease) Nieves Cough, MD as Consulting Physician (Urology) Debarah Lorrene DEL., MD (Ophthalmology)  I have updated your Care Teams any recent Medical Services you may have received from other providers in the past year.     Assessment:   This is a routine wellness examination for Artemio.  Hearing/Vision screen Hearing Screening - Comments:: Denies hearing difficulties, has tinnitus  Vision Screening - Comments:: Dr Milissa [facility] last in 12/2022   Goals Addressed             This  Visit's Progress    maintain current healthy lifestyle   On track      Depression Screen     07/21/2023    8:22 AM 05/26/2023    8:05 AM 02/09/2023    9:41 AM 01/05/2023    2:54 PM 10/13/2022    8:23 AM 09/23/2022   11:34 AM 07/18/2022    8:24 AM  PHQ 2/9 Scores  PHQ - 2 Score 0 0 0 0 0 0 0    Fall Risk     07/14/2023   12:47 PM 05/26/2023    8:05 AM 02/09/2023    9:41 AM 01/05/2023    2:54 PM 10/13/2022    8:23 AM  Fall Risk   Falls in the past year? 0 0 0 0 0  Number falls in past yr: 0 0 0 0 0  Injury with Fall? 0 0 0 0 0  Risk for fall due to : No Fall Risks      Follow up Falls evaluation completed Falls evaluation completed;Education provided Falls evaluation completed;Education provided Falls evaluation completed Falls evaluation completed    MEDICARE RISK AT HOME:  Medicare Risk at Home Any stairs in or around the home?: (Patient-Rptd) No Home free of loose throw rugs in walkways, pet beds, electrical cords, etc?: (Patient-Rptd) Yes Adequate lighting in your home to reduce risk of falls?: (Patient-Rptd) Yes Life alert?: (Patient-Rptd) Yes Use of a cane, walker or w/c?: (Patient-Rptd) No Grab bars in the bathroom?: (Patient-Rptd) Yes Shower chair or bench in shower?: (Patient-Rptd) No Elevated toilet seat or a handicapped toilet?: (Patient-Rptd) Yes  TIMED UP AND GO:  Was the test performed?  Yes  Length of time to ambulate 10 feet: 5 sec Gait steady and fast without use of assistive device  Cognitive Function: 6CIT completed        07/21/2023  8:32 AM 07/18/2022    8:25 AM 07/15/2021    8:18 AM  6CIT Screen  What Year? 0 points 0 points 0 points  What month? 0 points 0 points 0 points  What time? 0 points 0 points 0 points  Count back from 20 0 points 0 points 0 points  Months in reverse 0 points 0 points 0 points  Repeat phrase 0 points 0 points 0 points  Total Score 0 points 0 points 0 points    Immunizations Immunization History  Administered  Date(s) Administered   Fluad Quad(high Dose 65+) 10/12/2018   Influenza Split 11/26/2010, 10/24/2022   Influenza, High Dose Seasonal PF 11/07/2014, 11/14/2015, 11/12/2016, 11/17/2017   Influenza,inj,Quad PF,6+ Mos 11/22/2013   Influenza-Unspecified 11/27/2012, 10/17/2019, 11/01/2020, 10/29/2021   Novavax(Covid-19) Vaccine 10/24/2022   PFIZER(Purple Top)SARS-COV-2 Vaccination 02/22/2019, 03/15/2019, 10/24/2019, 05/28/2020   PNEUMOCOCCAL CONJUGATE-20 01/30/2021   Pfizer Covid-19 Vaccine Bivalent Booster 75yrs & up 11/01/2020, 10/29/2021   Pneumococcal Conjugate-13 08/10/2013   Pneumococcal Polysaccharide-23 06/21/2010   RSV,unspecified 12/27/2021   Td 09/15/2003   Tdap 02/19/2023   Tetanus 08/10/2013   Zoster Recombinant(Shingrix) 06/02/2017, 08/06/2017   Zoster, Live 09/10/2009    Screening Tests Health Maintenance  Topic Date Due   COVID-19 Vaccine (8 - 2024-25 season) 08/28/2023 (Originally 12/19/2022)   INFLUENZA VACCINE  08/28/2023   Colonoscopy  02/16/2024   Medicare Annual Wellness (AWV)  07/20/2024   DTaP/Tdap/Td (4 - Td or Tdap) 02/18/2033   Pneumococcal Vaccine: 50+ Years  Completed   Hepatitis C Screening  Completed   Zoster Vaccines- Shingrix  Completed   Hepatitis B Vaccines  Aged Out   HPV VACCINES  Aged Out   Meningococcal B Vaccine  Aged Out    Health Maintenance  There are no preventive care reminders to display for this patient.  Health Maintenance Items Addressed: Will discuss COVID booster with PCP in August.  Additional Screening:  Vision Screening: Recommended annual ophthalmology exams for early detection of glaucoma and other disorders of the eye. Would you like a referral to an eye doctor? No    Dental Screening: Recommended annual dental exams for proper oral hygiene  Community Resource Referral / Chronic Care Management: CRR required this visit?  No   CCM required this visit?  No   Plan:    I have personally reviewed and noted the  following in the patient's chart:   Medical and social history Use of alcohol, tobacco or illicit drugs  Current medications and supplements including opioid prescriptions. Patient is not currently taking opioid prescriptions. Functional ability and status Nutritional status Physical activity Advanced directives List of other physicians Hospitalizations, surgeries, and ER visits in previous 12 months Vitals Screenings to include cognitive, depression, and falls Referrals and appointments  In addition, I have reviewed and discussed with patient certain preventive protocols, quality metrics, and best practice recommendations. A written personalized care plan for preventive services as well as general preventive health recommendations were provided to patient.   Lolita Libra, CMA   07/21/2023   After Visit Summary: (In Person-Printed) AVS printed and given to the patient  Notes: Nothing significant to report at this time.

## 2023-08-14 ENCOUNTER — Ambulatory Visit (INDEPENDENT_AMBULATORY_CARE_PROVIDER_SITE_OTHER): Admitting: Pulmonary Disease

## 2023-08-14 ENCOUNTER — Encounter: Payer: Self-pay | Admitting: Pulmonary Disease

## 2023-08-14 VITALS — BP 149/72 | HR 67 | Ht 66.0 in | Wt 188.6 lb

## 2023-08-14 DIAGNOSIS — K219 Gastro-esophageal reflux disease without esophagitis: Secondary | ICD-10-CM

## 2023-08-14 DIAGNOSIS — R053 Chronic cough: Secondary | ICD-10-CM | POA: Diagnosis not present

## 2023-08-14 MED ORDER — FAMOTIDINE 20 MG PO TABS: 20.0000 mg | ORAL_TABLET | Freq: Every day | ORAL | 5 refills | Status: DC

## 2023-08-14 NOTE — Patient Instructions (Addendum)
 Your cough appears to be from chronic reflux  Recommend elevated the head of your bed with blocks, about 4-6 inches or use a wedge pillow  Continue protonix /pantoprazole  40mg  daily, take 30 minutes before breakfast on an empty stomach with water  Start pepcid/famotidine 20mg  at bedtime  We will schedule you for pulmonary function tests at the front desk  Follow up in 2 months to review pulmonary function tests and monitor how you are doing on new reflux medicine

## 2023-08-14 NOTE — Progress Notes (Signed)
 Synopsis: Referred in July 2025 for Cough  Subjective:   PATIENT ID: Rick Campbell GENDER: male DOB: Jun 02, 1945, MRN: [id number]  HPI  Chief Complaint  Patient presents with   Consult    Coughing and congestion/headaches/drainage for several years. Pt describes choking and gagging cough. Rarely coughs anything up.     Rick Campbell is a 78 year old male, never smoker with history of seasonal allergies and GERD who is referred to pulmonary clinic for cough.   He has had a persistent, hacking, dry cough for two to three years, initially thought to be due to sinusitis. The cough is constant, with associated congestion, and causes vocal cord tightening and gagging, affecting his ability to speak. It is unproductive and primarily affects the upper chest. There is no wheezing, shortness of breath, or seasonal variation.  An ENT specialist noted age-related changes and throat edema but no polyps. He has not attended speech therapy. A two-month trial of Breo for suspected asthma was ineffective. He takes Protonix  for reflux and has eliminated caffeine, maintaining a five-hour gap between his last meal and bedtime.  He was exposed to secondhand smoke during childhood but denies personal smoking history, exposure to dust, chemicals, or pets. There is no history of eczema, skin rashes, or symptoms like wheezing, shortness of breath, or cough disturbing sleep.  Past Medical History:  Diagnosis Date   Adenomatous polyp of colon    Allergy     seasonal   Cataract    Chronic headache    Esophageal reflux    Hyperlipidemia    Lower urinary tract symptoms (LUTS)      Family History  Problem Relation Age of Onset   Lung cancer Mother        was a heavy smoker   Colon cancer Father        dx age 68s   Diabetes Father    Heart Problems Father        father CHF in his 26s   COPD Neg Hx    Arthritis Neg Hx    Prostate cancer Neg Hx    Esophageal cancer Neg Hx    Stomach cancer Neg Hx     Rectal cancer Neg Hx      Social History   Socioeconomic History   Marital status: Married    Spouse name: Not on file   Number of children: 2   Years of education: Not on file   Highest education level: Master's degree (e.g., MA, MS, MEng, MEd, MSW, MBA)  Occupational History   Occupation: retired- Engineer, production    Comment: Marketing  Tobacco Use   Smoking status: Never   Smokeless tobacco: Never  Vaping Use   Vaping status: Never Used  Substance and Sexual Activity   Alcohol use: Yes    Alcohol/week: 2.0 standard drinks of alcohol    Types: 2 Glasses of wine per week    Comment: Glass of wine; weekend.  2/week   Drug use: No   Sexual activity: Yes  Other Topics Concern   Not on file  Social History Narrative   retired from [employer]  , lives w/ wife , both children in [place]   4 g-children         Social Drivers of Health   Financial Resource Strain: Low Risk  (07/21/2023)   Overall Financial Resource Strain (CARDIA)    Difficulty of Paying Living Expenses: Not hard at all  Food Insecurity: No Food Insecurity (07/21/2023)   Hunger  Vital Sign    Worried About Programme researcher, broadcasting/film/video in the Last Year: Never true    Ran Out of Food in the Last Year: Never true  Transportation Needs: No Transportation Needs (07/21/2023)   PRAPARE - Administrator, Civil Service (Medical): No    Lack of Transportation (Non-Medical): No  Physical Activity: Sufficiently Active (07/21/2023)   Exercise Vital Sign    Days of Exercise per Week: 5 days    Minutes of Exercise per Session: 50 min  Stress: No Stress Concern Present (07/21/2023)   [employer] of Occupational Health - Occupational Stress Questionnaire    Feeling of Stress: Not at all  Social Connections: Socially Integrated (07/21/2023)   Social Connection and Isolation Panel    Frequency of Communication with Friends and Family: More than three times a week    Frequency of Social Gatherings with Friends and Family: More than  three times a week    Attends Religious Services: More than 4 times per year    Active Member of [employer] or Organizations: Yes    Attends Engineer, structural: More than 4 times per year    Marital Status: Married  Catering manager Violence: Not At Risk (07/21/2023)   Humiliation, Afraid, Rape, and Kick questionnaire    Fear of Current or Ex-Partner: No    Emotionally Abused: No    Physically Abused: No    Sexually Abused: No     Allergies  Allergen Reactions   Sulfonamide Derivatives Hives and Swelling    Urticaria     Outpatient Medications Prior to Visit  Medication Sig Dispense Refill   atorvastatin  (LIPITOR) 20 MG tablet Take 1 tablet (20 mg total) by mouth daily. 90 tablet 2   fluticasone  (FLONASE ) 50 MCG/ACT nasal spray Place 2 sprays into both nostrils daily. 16 g 6   levocetirizine (XYZAL  ALLERGY  24HR) 5 MG tablet Take 1 tablet (5 mg total) by mouth every evening. 30 tablet 3   Multiple Vitamins-Minerals (SENIOR MULTIVITAMIN PLUS PO) Take 1 tablet by mouth daily.     pantoprazole  (PROTONIX ) 40 MG tablet Take 1 tablet (40 mg total) by mouth daily before breakfast. 90 tablet 1   tamsulosin  (FLOMAX ) 0.4 MG CAPS capsule Take 1 capsule (0.4 mg total) by mouth daily after supper. 90 capsule 1   BREO ELLIPTA  100-25 MCG/ACT AEPB Inhale 1 puff into the lungs daily. (Patient taking differently: Inhale 1 puff into the lungs as needed. For emergency per pt) 1 each 3   No facility-administered medications prior to visit.   Review of Systems  Constitutional:  Negative for chills, fever, malaise/fatigue and weight loss.  HENT:  Negative for congestion, sinus pain and sore throat.   Eyes: Negative.   Respiratory:  Positive for cough. Negative for hemoptysis, sputum production, shortness of breath and wheezing.   Cardiovascular:  Negative for chest pain, palpitations, orthopnea, claudication and leg swelling.  Gastrointestinal:  Positive for heartburn. Negative for abdominal pain,  nausea and vomiting.  Genitourinary: Negative.   Musculoskeletal:  Negative for joint pain and myalgias.  Skin:  Negative for rash.  Neurological:  Negative for weakness.  Endo/Heme/Allergies: Negative.   Psychiatric/Behavioral: Negative.     Objective:   Vitals:   08/14/23 0844 08/14/23 0847  BP: (!) 149/82 (!) 149/72  Pulse: 67   SpO2: 96%   Weight: 188 lb 9.6 oz (85.5 kg)   Height: 5' 6 (1.676 m)    Physical Exam Constitutional:  General: He is not in acute distress.    Appearance: Normal appearance.  Eyes:     General: No scleral icterus.    Conjunctiva/sclera: Conjunctivae normal.  Cardiovascular:     Rate and Rhythm: Normal rate and regular rhythm.  Pulmonary:     Breath sounds: No wheezing, rhonchi or rales.  Musculoskeletal:     Right lower leg: No edema.     Left lower leg: No edema.  Skin:    General: Skin is warm and dry.  Neurological:     General: No focal deficit present.    CBC    Component Value Date/Time   WBC 5.3 02/09/2023 1001   RBC 5.09 02/09/2023 1001   HGB 16.1 02/09/2023 1001   HCT 48.2 02/09/2023 1001   PLT 179.0 02/09/2023 1001   PLT 206 05/21/2007 0000   MCV 94.8 02/09/2023 1001   MCH 31.7 06/21/2010 1621   MCHC 33.4 02/09/2023 1001   RDW 13.5 02/09/2023 1001   LYMPHSABS 1.4 02/09/2023 1001   MONOABS 0.4 02/09/2023 1001   EOSABS 0.1 02/09/2023 1001   BASOSABS 0.0 02/09/2023 1001      Latest Ref Rng & Units 02/09/2023   10:01 AM 01/30/2022    9:27 AM 01/30/2021    9:36 AM  BMP  Glucose 70 - 99 mg/dL 91  94  89   BUN 6 - 23 mg/dL 12  14  14    Creatinine 0.40 - 1.50 mg/dL 9.09  9.01  9.10   Sodium 135 - 145 mEq/L 140  140  141   Potassium 3.5 - 5.1 mEq/L 4.2  4.3  4.1   Chloride 96 - 112 mEq/L 104  104  103   CO2 19 - 32 mEq/L 29  29  31    Calcium  8.4 - 10.5 mg/dL 9.4  9.6  9.1    Chest imaging: CXR 10/13/22 The heart size and mediastinal contours are within normal limits. Both lungs are clear. The visualized skeletal  structures are unremarkable.  PFT:     No data to display          Labs:  Path:  Echo:  Heart Catheterization:     Assessment & Plan:   Chronic cough - Plan: Pulmonary Function Test  Gastroesophageal reflux disease without esophagitis - Plan: famotidine (PEPCID) 20 MG tablet  Discussion: Rick Campbell is a 78 year old male, never smoker with history of seasonal allergies and GERD who is referred to pulmonary clinic for cough.   Chronic cough  Chronic cough likely due to reflux-related inflammation, evidenced by vocal cord edema and potential microaspiration. Breo ineffective. No wheezing or dyspnea. No smoking history, but childhood secondhand smoke exposure. - Order PFTs to assess lung function. - Consider high-resolution CT scan if PFTs abnormal. - Continue Protonix  30 minutes before breakfast. - Add famotidine at bedtime. - Recommend elevating head of bed. - Discuss potential surgical intervention for hiatal hernia if identified.  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 - Encourage attendance at speech therapy. - Protonix  and famotidine as above  Follow up in 2 months to review PFTs.  Dorn Chill, MD [facility] Office: [phone]   Current Outpatient Medications:    atorvastatin  (LIPITOR) 20 MG tablet, Take 1 tablet (20 mg total) by mouth daily., Disp: 90 tablet, Rfl: 2   famotidine (PEPCID) 20 MG tablet, Take 1 tablet (20 mg total) by mouth at bedtime., Disp:  30 tablet, Rfl: 5   fluticasone  (FLONASE ) 50 MCG/ACT nasal spray, Place 2 sprays into both nostrils daily., Disp: 16 g, Rfl: 6   levocetirizine (XYZAL  ALLERGY  24HR) 5 MG tablet, Take 1 tablet (5 mg total) by mouth every evening., Disp: 30 tablet, Rfl: 3   Multiple Vitamins-Minerals (SENIOR MULTIVITAMIN PLUS PO), Take 1 tablet by mouth daily., Disp: , Rfl:    pantoprazole  (PROTONIX ) 40 MG tablet, Take  1 tablet (40 mg total) by mouth daily before breakfast., Disp: 90 tablet, Rfl: 1   tamsulosin  (FLOMAX ) 0.4 MG CAPS capsule, Take 1 capsule (0.4 mg total) by mouth daily after supper., Disp: 90 capsule, Rfl: 1

## 2023-09-02 ENCOUNTER — Other Ambulatory Visit: Payer: Self-pay | Admitting: Internal Medicine

## 2023-09-04 ENCOUNTER — Ambulatory Visit (INDEPENDENT_AMBULATORY_CARE_PROVIDER_SITE_OTHER): Admitting: Otolaryngology

## 2023-09-04 ENCOUNTER — Encounter (INDEPENDENT_AMBULATORY_CARE_PROVIDER_SITE_OTHER): Payer: Self-pay | Admitting: Otolaryngology

## 2023-09-04 VITALS — BP 149/74 | HR 67

## 2023-09-04 DIAGNOSIS — J383 Other diseases of vocal cords: Secondary | ICD-10-CM | POA: Diagnosis not present

## 2023-09-04 DIAGNOSIS — R0981 Nasal congestion: Secondary | ICD-10-CM | POA: Diagnosis not present

## 2023-09-04 DIAGNOSIS — R053 Chronic cough: Secondary | ICD-10-CM

## 2023-09-04 DIAGNOSIS — R490 Dysphonia: Secondary | ICD-10-CM

## 2023-09-04 DIAGNOSIS — J343 Hypertrophy of nasal turbinates: Secondary | ICD-10-CM

## 2023-09-04 DIAGNOSIS — J3089 Other allergic rhinitis: Secondary | ICD-10-CM

## 2023-09-04 DIAGNOSIS — K219 Gastro-esophageal reflux disease without esophagitis: Secondary | ICD-10-CM

## 2023-09-04 NOTE — Progress Notes (Signed)
 ENT Progress Note:   Update 09/04/2023  Discussed the use of AI scribe software for clinical note transcription with the patient, who gave verbal consent to proceed.  History of Present Illness Rick Campbell is a 78 year old male who presents with chronic cough and hoarseness.  He has been experiencing persistent and significant coughing, which has not improved since the last visit. The cough is frequent and severe, impacting his ability to speak, as demonstrated by an incident at a dinner party where he was unable to complete sentences due to throat tightness. Increased hoarseness has also been noted by his wife.  He has attempted several interventions to manage his symptoms, using 'Reflux Gourmet', elevating the head of his bed at night, and reducing caffeine and carbonated beverages. Despite these efforts, there has been no significant improvement in his symptoms.  He consulted with a pulmonologist who did not find evidence of asthma but suggested further evaluation with a pulmonary function tests. GERD and acid reflux were discussed as potential contributors to his symptoms, aligning with previous discussions.   Current medications include Flonase  for nasal congestion, which he continues to use. No sinus disease has been reported on imaging.  Records Reviewed:  Pulmonary note 08/14/23  Gastroesophageal reflux disease without esophagitis - Plan: famotidine  (PEPCID ) 20 MG tablet   Discussion: Rick Campbell is a 78 year old male, never smoker with history of seasonal allergies and GERD who is referred to pulmonary clinic for cough.    Chronic cough  Chronic cough likely due to reflux-related inflammation, evidenced by vocal cord edema and potential microaspiration. Breo ineffective. No wheezing or dyspnea. No smoking history, but childhood secondhand smoke exposure. - Order PFTs to assess lung function. - Consider high-resolution CT scan if PFTs abnormal. - Continue Protonix  30 minutes  before breakfast. - Add famotidine  at bedtime. - Recommend elevating head of bed. - Discuss potential surgical intervention for hiatal hernia if identified.   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 - Encourage attendance at speech therapy. - Protonix  and famotidine  as above   Follow up in 2 months to review PFTs.   Initial Evaluation  Reason for Consult: chronic hoarseness, recurrent vs chronic sinusitis x 1-1.5 yrs and dry cough    HPI: Discussed the use of AI scribe software for clinical note transcription with the patient, who gave verbal consent to proceed.  History of Present Illness Rick Campbell is a 78 year old male who presents with hoarseness chronic dry cough. He was referred by Dr. Amon for evaluation of hoarseness.  He has experienced changes in his voice, including hoarseness and a raspy quality, intermittently over the past year to year and a half. The most recent episode began about a week to a week and a half ago. He describes difficulty speaking, stating that 'something grabs my throat' and he cannot complete a sentence without hacking and clearing his throat.  He also experiences sinus congestion and a dry, hacking cough. No trouble with swallowing or shortness of breath. He walks three to four miles a day and exercises without difficulty.  He has a history of reflux, which he feels is under control, as he does not experience heartburn. He has been prescribed Symbicort , Mucinex , Tessalon  Perles, Astepro , and an inhaler (Breo) in the past, but none have alleviated his symptoms. A chest x-ray was performed and was normal. He was treated with a Z-Pak and another antibiotic for  a suspected sinus infection last September or October, but this did not resolve his symptoms.  He was tested for allergies 25 to 30 years ago, which showed allergies to cats and dust. He underwent allergy  shots for four to  five years without significant improvement. He notes that exposure to dust, such as when going into the attic, can trigger his symptoms.  He is currently taking Aciphex  for reflux and alternates between using Flonase  and Astepro  nasal sprays three to four times a day.   Records Reviewed:  Office visit with Dr Amon 10/13/22 Hyperlipidemia GERD Seasonal allergies Obesity H/o increased PSA , LUTS, sees urology Headaches, chronic   PLAN Cough: See LOV, since then felt better for a couple of days but is back with respiratory symptoms. Did not get to take a Z-Pak. He has a history of reactive airway disease, current symptoms could be bronchospasm Plan: CXR Prednisone  and Symbicort  for possible coughvariant asthma, Symbicort  technique discussed. Continue Astepro  Mucinex  Tessalon  Perles. Use antibiotic if not better Call if not better.   Past Medical History:  Diagnosis Date   Adenomatous polyp of colon    Allergy     seasonal   Cataract    Chronic headache    Esophageal reflux    Hyperlipidemia    Lower urinary tract symptoms (LUTS)     Past Surgical History:  Procedure Laterality Date   CHOLECYSTECTOMY  1998   EYE SURGERY Bilateral 11/27/2017   cataract and retina surgery   POLYPECTOMY      Family History  Problem Relation Age of Onset   Lung cancer Mother        was a heavy smoker   Colon cancer Father        dx age 74s   Diabetes Father    Heart Problems Father        father CHF in his 69s   COPD Neg Hx    Arthritis Neg Hx    Prostate cancer Neg Hx    Esophageal cancer Neg Hx    Stomach cancer Neg Hx    Rectal cancer Neg Hx     Social History:  reports that he has never smoked. He has never used smokeless tobacco. He reports current alcohol use of about 2.0 standard drinks of alcohol per week. He reports that he does not use drugs.  Allergies:  Allergies  Allergen Reactions   Sulfonamide Derivatives Hives and Swelling    Urticaria    Medications: I  have reviewed the patient's current medications.  The PMH, PSH, Medications, Allergies, and SH were reviewed and updated.  ROS: Constitutional: Negative for fever, weight loss and weight gain. Cardiovascular: Negative for chest pain and dyspnea on exertion. Respiratory: Is not experiencing shortness of breath at rest. Gastrointestinal: Negative for nausea and vomiting. Neurological: Negative for headaches. Psychiatric: The patient is not nervous/anxious  Blood pressure (!) 149/74, pulse 67, SpO2 97%. There is no height or weight on file to calculate BMI.  PHYSICAL EXAM: Exam: General: Well-developed, well-nourished Respiratory Respiratory effort: Equal inspiration and expiration without stridor Cardiovascular Peripheral Vascular: Warm extremities with equal color/perfusion Eyes: No nystagmus with equal extraocular motion bilaterally Neuro/Psych/Balance: Patient oriented to person, place, and time; Appropriate mood and affect; Gait is intact with no imbalance; Cranial nerves I-XII are intact Head and Face Inspection: Normocephalic and atraumatic without mass or lesion Palpation: Facial skeleton intact without bony stepoffs Salivary Glands: No mass or tenderness  Facial Strength: Facial motility symmetric and full bilaterally ENT Pinna: External  ear intact and fully developed External canal: Canal is patent with intact skin Tympanic Membrane: Clear and mobile External Nose: No scar or anatomic deformity Internal Nose: Septum is relatively straight on anterior rhinoscopy. Bilateral inferior turbinate hypertrophy.  Lips, Teeth, and gums: Mucosa and teeth intact and viable Oral cavity/oropharynx: No erythema or exudate, no lesions present Neck Neck and Trachea: Midline trachea without mass or lesion Thyroid : No mass or nodularity Lymphatics: No lymphadenopathy   Studies Reviewed: CXR 10/24/22 FINDINGS: The heart size and mediastinal contours are within normal limits. Both  lungs are clear. The visualized skeletal structures are unremarkable.   IMPRESSION: No active cardiopulmonary disease.  04/15/10 CT max face      Assessment/Plan: No diagnosis found.   Assessment and Plan Assessment & Plan Chronic Hoarseness and raspy voice Intermittent hoarseness and raspy voice for 18 months, exacerbated recently. Vocal cords are mobile and without lesions on flex scope today but there was evidence of VF atrophy and glottic insufficiency, likely due to age-related muscle loss. He also had post-cricoid edema or postnasal drainage. No masses or tumors. - Offer speech therapy to optimize lung support and voice projection. - medical management of GERD and PND  Chronic cough Chronic dry cough possibly related to postnasal drainage, untreated reflux, or neurogenic cough. Previous asthma treatments ineffective. No lung disease hx or swallowing issues. Neurogenic cough considered if other treatments fail. - Continue Aciphex  for reflux management. - Provided dietary and lifestyle modification information to minimize reflux. - Consider Reflux Gourmet supplement after meals - Refer to pulmonary specialist for pulmonary function testing - Consider superior laryngeal nerve block or medications like tramadol , gabapentin, or amitriptyline if cough persists.  Chronic nasal congestion and suspected environmental allergies (was tested before and had allergy  shots) Last CT max/face with clear paranasal sinuses in 2012 and evidence of ITH. Low suspicion for CRS, but will consider CT sinuses in the future.  We discussed medical management of allergies - continue nasal spray and antihistamine   GERD LPR Reflux well-managed with Aciphex , but potential contribution to chronic cough and voice changes due to silent non-acid reflux. No heartburn.  - Continue Aciphex  for reflux management. - Provided dietary and lifestyle modification information to minimize reflux. - Consider Reflux  Gourmet supplement to form a barrier and reduce reflux.  Pulm referral Speech therapy  RTC 3 mo - will consider SLN block when he returns  Update 09/04/23 Chronic cough Persistent cough with no improvement despite of GERD and PND management. Pulmonologist ruled out pulmonary causes, PFTs will be done in near future. Consideration of superior laryngeal nerve block to treat for neurogenic cough. He would like to try it.  - Schedule superior laryngeal nerve block. - Provided procedure description in after visit summary. - Continue reflux management strategies and instructed to continue with management of PND  Hoarseness chronic dysphonia and VF atrophy He did not attend speech therapy and would like to try other options. Options include filler injection augmentation - Provided filler injection procedure description in after visit summary. - Discuss it when he returns for SLN block  Gastroesophageal reflux disease (GERD) LPR GERD management ongoing with lifestyle modifications. GERD contributes to symptoms. - Continue reflux gourmet and lifestyle modifications. - continue Protonix   Chronic nasal congestion Nasal congestion present without sinus disease. Flonase  used for management. - Continue Flonase  for nasal congestion.   Thank you for allowing me to participate in the care of this patient. Please do not hesitate to contact me with any questions or  concerns.   Elena Larry, MD Otolaryngology [facility] Phone: [phone] Fax: [phone]    09/04/2023, 9:57 AM

## 2023-09-04 NOTE — Patient Instructions (Signed)
 Superior laryngeal nerve block for chronic cough, throat clearing, or pain  Some patients have symptoms from inflammation or hypersensitivity of the nerve that provides sensation to the inside of the throat. This nerve enters the throat right above the Adam's apple, and there is one on each side. It is called the superior laryngeal nerve.  An injection of lidocaine and steroid can be given to this area where the sensory nerve enters the throat.  This sometimes helps patients with an oversensitive nerve or cough reflex. It resets an abnormal cough threshold or overactive pain signals from the throat. The steroid acts to decrease any inflammation around the nerve that could be adding to this hypersensitivity.  The injection may help right away, or it may take up to a week or two to start working.  If it hasn't helped at all after 2-3 weeks, Dr Chace Klippel will often try a second injection.  Some patients with no response to the first injection still have a good response to the second one.  There is no set schedule for repeating these injections.  It is based entirely on how long they help your symptoms.  If you find the injection helpful but the symptoms come back several months later, then you can repeat it at that time.  Some patients never need a second injection.  Side effects from the injection are mostly related to numbness inside the throat.  If this happens, you may feel one or both sides of your throat go numb. This will last about 1 hour, and you should not swallow anything until the sensation returns to normal, usually about an hour.  It is sometimes frightening to patients when the sensation inside their throat changes, but it is not dangerous.  You may choose to stay in our waiting room until the feeling goes away, but this is not a requirement.  If this happens, it will improve gradually over the hour after the procedure. Other risks include accidental injection into the blood vessels, pain,  temporary swallowing trouble or voice changes, and side effects related to steroids generally, like raised blood sugar or increased appetite. Many patients do not notice any steroid-related side effects from the injection, but if you have never taken steroids or aren't familiar with their effects, you should ask Dr Alacia Rehmann for additional details.  Diabetics shouyld plan to check their blood sugar more frequently in the 2 days after the injection.  The injection takes approximately 1 minute per side, and\ it is performed during a routine office visit. There are no precautions afterward except for not swallowing for 1 hour if throat numbness occurs. Patients can drive themselves to and from the visit. There is no recovery time, but some patients have mild bruising or tenderness at the injection site.  HYALURONIC ACID (RESTYLANE) INJECTION TO THE VOCAL CORD What is Hyaluronic Acid? Hyaluronic Acid is a synthetic substance. It contains a substance that is found in our subcutaneous tissue, or the soft tissue just beneath our skin. It has also been used in plastic surgery to fill in wrinkles or plump lips.    Hyaluronic Acid and the Vocal Cords Vocal cord problems are usually due to a lack of movement, bowing (loss of muscle) or weakness of the vocal cords. This lack of motion makes speech difficult. A lack of vocal cord motion can also cause:  Low, raspy or rough voice  Constant hoarseness  Breathing difficulty  Inability to speak above a whisper  Coughing or choking when swallowing  Hyaluronic Acid increases the size or mass of the vocal cord where muscle loss has occurred. It acts like a space filler. Hyaluronic Acid is injected next to the vocal cords and into the muscles supporting the vocal cords. The body slowly reabsorbs it over time, and its effects usually last about 4-6 months, although this range varies for each patient.  Hyaluronic Acid Treatment You will be awake during the treatment and  able to talk and breathe normally. Numbing medicines are used before the procedure to decrease the discomfort. The treatment will take about 5-10 minutes.    You should not eat a big meal before the injection.  In the office: You will sit up in a chair for the injection. A fiberoptic camera called a nasoendoscope is placed in a nostril and will go down to the base of your throat. This will allow the doctor to see your vocal cords. An injection of Hyaluronic Acid can be given in several different ways, including via your mouth, directly into the surface of the vocal cord, or through the skin near your "Adam's apple" on your throat. If it is given through the skin, the lidocaine is injected into the skin to numb it before the injection.  If it is given via the mouth, the lining of the throat is numbed with liquid lidocaine first. We often offer a dose of valium to be taken before the procedure if you have someone who can drive you home afterward. Call the office if you would like this prescribed for you, then pick it up at your pharmacy and take it 1 hour before the procedure. Many patients choose not to take the valium, and they are able to drive themselves to and from the appointment.  In the operating room: You will be asleep under general anesthesia, with a breathing tube in. The procedure takes approximately 10 minutes and is done in a hospital or surgery center.  Risks include (but are not limited to) swelling, infection or allergic reaction, pain, extravasation into a more superficial layer of the vocal cord (making the voice more strained), inflammation, or failure to improve the voice. Rarely, additional procedures are needed to address these complications.  The voice is usually strained for a few days, then settles in by 4-7 days. Some patients may sound very good after the injection but find that their voice fades quickly, much earlier than the usual 4-6 months. These patients may benefit from a  second injection right away, and they should call the office.  If you have any trouble breathing or severe pain with swallowing or talking, you should call the office right away. It is not unusual to have a sore throat for a few days afterward, but if this soreness or pain is severe, you should call the office. You should always call the office for any questions or issues.  After a Hyaluronic Acid Treatment  You can talk right after your treatment.  If you had the procedure done in the office, you should not eat or drink for 1 hour, or until the sensation in your throat completely returns to normal  You may have some discomfort or pain that radiates from your throat to your ear.  You will follow up with your doctor as instructed.  The Hyaluronic Acid procedure results can last from 3-6 months, but this is different for every patient.

## 2023-09-07 DIAGNOSIS — H59811 Chorioretinal scars after surgery for detachment, right eye: Secondary | ICD-10-CM | POA: Diagnosis not present

## 2023-09-07 DIAGNOSIS — Z961 Presence of intraocular lens: Secondary | ICD-10-CM | POA: Diagnosis not present

## 2023-09-07 DIAGNOSIS — H35371 Puckering of macula, right eye: Secondary | ICD-10-CM | POA: Diagnosis not present

## 2023-09-09 ENCOUNTER — Encounter: Payer: Self-pay | Admitting: Internal Medicine

## 2023-09-09 ENCOUNTER — Ambulatory Visit: Payer: Medicare Other | Admitting: Internal Medicine

## 2023-09-09 VITALS — BP 132/72 | HR 75 | Temp 97.8°F | Resp 16 | Ht 66.0 in | Wt 188.5 lb

## 2023-09-09 DIAGNOSIS — R011 Cardiac murmur, unspecified: Secondary | ICD-10-CM

## 2023-09-09 DIAGNOSIS — R053 Chronic cough: Secondary | ICD-10-CM | POA: Diagnosis not present

## 2023-09-09 DIAGNOSIS — K219 Gastro-esophageal reflux disease without esophagitis: Secondary | ICD-10-CM

## 2023-09-09 DIAGNOSIS — R490 Dysphonia: Secondary | ICD-10-CM | POA: Diagnosis not present

## 2023-09-09 NOTE — Progress Notes (Signed)
   Subjective:    Patient ID: Rick Campbell, male    DOB: 09-04-1945, 78 y.o.   MRN: [id number]  DOS:  09/09/2023 Type of visit - description: Follow-up  Chronic medical problems addressed. Notes from pulmonary and ENT reviewed.  They evaluate him from chronic cough and hoarseness felt to be due to GERD. He denies any heartburn, no dysphagia or odynophagia.   Review of Systems See above   Past Medical History:  Diagnosis Date   Adenomatous polyp of colon    Allergy     seasonal   Cataract    Chronic headache    Esophageal reflux    Hyperlipidemia    Lower urinary tract symptoms (LUTS)     Past Surgical History:  Procedure Laterality Date   CHOLECYSTECTOMY  1998   EYE SURGERY Bilateral 11/27/2017   cataract and retina surgery   POLYPECTOMY      Current Outpatient Medications  Medication Instructions   atorvastatin  (LIPITOR) 20 mg, Oral, Daily   famotidine  (PEPCID ) 20 mg, Oral, Daily at bedtime   fluticasone  (FLONASE ) 50 MCG/ACT nasal spray 2 sprays, Each Nare, Daily   levocetirizine (XYZAL  ALLERGY  24HR) 5 mg, Oral, Every evening   Multiple Vitamins-Minerals (SENIOR MULTIVITAMIN PLUS PO) 1 tablet, Daily   pantoprazole  (PROTONIX ) 40 mg, Oral, Daily before breakfast   tamsulosin  (FLOMAX ) 0.4 mg, Oral, Daily after supper       Objective:   Physical Exam BP 132/72   Pulse 75   Temp 97.8 F (36.6 C) (Oral)   Resp 16   Ht 5' 6 (1.676 m)   Wt 188 lb 8 oz (85.5 kg)   SpO2 97%   BMI 30.42 kg/m  General:   Well developed, NAD, BMI noted. HEENT:  Normocephalic . Face symmetric, atraumatic Lungs:  CTA B Normal respiratory effort, no intercostal retractions, no accessory muscle use. Heart: RRR, + systolic murmur.  II/IV Lower extremities: no pretibial edema bilaterally  Skin: Not pale. Not jaundice Neurologic:  alert & oriented X3.  Speech normal, gait appropriate for age and unassisted Psych--  Cognition and judgment appear intact.  Cooperative with normal  attention span and concentration.  Behavior appropriate. No anxious or depressed appearing.      Assessment     Assessment Hyperlipidemia GERD Seasonal allergies Obesity H/o increased PSA , LUTS, sees urology Headaches, chronic   PLAN Chronic cough: Saw pulmonary 08/14/2023, felt to be GERD related.  They Rx PFTs.  Continue Protonix , add famotidine . Chronic hoarseness: Saw ENT recently.  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  Also noted GERD changes. Has a f/u planned  GERD: Patient is concerned about the issue which is creating chronic cough and hoarseness.  He does not have heartburn.  Currently on pantoprazole  daily and famotidine .   He does not have dysphagia.:  Discussed the following: Continue present care vs increase pantoprazole  vs GI referral for further advice.  We agreed on GI referral.  He may or may not need EGD, he would prefer not to increase PPIs. (Incidentally he is due for a colonoscopy 01/2024) Heart murmur: Last year he had a questionable murmur, today I do hear a systolic murmur, denies any symptoms. EKG today: NSR. Plan: Echo Vaccine advise: Flu and COVID booster recommended. RTC 4 months

## 2023-09-09 NOTE — Patient Instructions (Signed)
 Vaccines I recommend: Flu shot COVID booster  We are referring you to gastroenterology, you can reach them at [phone] We are arranging for an echocardiogram.   Next office visit for a checkup in 4 months Please make an appointment before you leave today

## 2023-09-09 NOTE — Assessment & Plan Note (Signed)
 Chronic cough: Saw pulmonary 08/14/2023, felt to be GERD related.  They Rx PFTs.  Continue Protonix , add famotidine . Chronic hoarseness: Saw ENT recently.  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  Also noted GERD changes. Has a f/u planned  GERD: Patient is concerned about the issue which is creating chronic cough and hoarseness.  He does not have heartburn.  Currently on pantoprazole  daily and famotidine .   He does not have dysphagia.:  Discussed the following: Continue present care vs increase pantoprazole  vs GI referral for further advice.  We agreed on GI referral.  He may or may not need EGD, he would prefer not to increase PPIs. (Incidentally he is due for a colonoscopy 01/2024) Heart murmur: Last year he had a questionable murmur, today I do hear a systolic murmur, denies any symptoms. EKG today: NSR. Plan: Echo Vaccine advise: Flu and COVID booster recommended. RTC 4 months

## 2023-09-11 ENCOUNTER — Ambulatory Visit (HOSPITAL_COMMUNITY)
Admission: RE | Admit: 2023-09-11 | Discharge: 2023-09-11 | Disposition: A | Discharge status: Home / Self Care | Source: Ambulatory Visit | Attending: Internal Medicine | Admitting: Internal Medicine

## 2023-09-11 DIAGNOSIS — R011 Cardiac murmur, unspecified: Secondary | ICD-10-CM | POA: Diagnosis not present

## 2023-09-11 LAB — ECHOCARDIOGRAM COMPLETE
AR max vel: 1.45 cm2
AV Area VTI: 1.39 cm2
AV Area mean vel: 1.45 cm2
AV Mean grad: 18.7 mmHg
AV Peak grad: 32.1 mmHg
Ao pk vel: 2.83 m/s
Area-P 1/2: 3.81 cm2
P 1/2 time: 451 ms
S' Lateral: 2.1 cm

## 2023-09-12 ENCOUNTER — Other Ambulatory Visit: Payer: Self-pay | Admitting: Internal Medicine

## 2023-09-13 ENCOUNTER — Ambulatory Visit: Payer: Self-pay | Admitting: Internal Medicine

## 2023-09-17 ENCOUNTER — Ambulatory Visit (INDEPENDENT_AMBULATORY_CARE_PROVIDER_SITE_OTHER): Admitting: Otolaryngology

## 2023-09-17 ENCOUNTER — Encounter (INDEPENDENT_AMBULATORY_CARE_PROVIDER_SITE_OTHER): Payer: Self-pay | Admitting: Otolaryngology

## 2023-09-17 DIAGNOSIS — R053 Chronic cough: Secondary | ICD-10-CM | POA: Diagnosis not present

## 2023-09-17 NOTE — Progress Notes (Signed)
 Procedure note: Superior laryngeal nerve block (35591-49) and steroid injection  ATTENDING: Elena Larry, M.D.  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 The risks and benefits of the surgical procedure have been explained in detail to the patient and they have elected to proceed.  CONSENT:  Informed consent was obtained prior to the procedure after discussion of risks, benefits, and alternatives and expected outcomes were discussed with the patient; consent placed in chart. The possibilities of reaction to medication, increase in pain, bleeding, infection, prolonged numbness or dysphagia, inadequate treatment response, continued or increased pain, the need for additional procedures, failure to diagnose a condition, and creating a complication requiring transfusion or operation or additional intervention were discussed with the patient. The patient concurred with the proposed plan, giving informed consent.    UNIVERSAL PROTOCOL/ TIMEOUT: Preprocedure verification is complete- patient verified and consents confirmed.  H&P REVIEW: The patient's history and physical were reviewed today prior to procedure. All medications were reviewed and updated as well.  ANESTHESIA: lidocaine 1% without epi  PROCEDURE DETAILS: The patient was brought to the clinic and placed in a seated position.  The thyroid  cartilage and hyoid bone were palpated and the thyrohyoid membrane was identified. 1ml of kenalog 40mg /ml and 1ml of local anesthetic were mixed and injected at the site of the left and then right superior laryngeal nerve entry through the thyrohyoid membrane, taking great  care to avoid intra-arterial injection. This concluded the procedure. There were no complications and the patient tolerated the procedure well.  ESTIMATED BLOOD LOSS: Minimal  FINDINGS:  No evidence of significant bleeding or other complication  CONDITION: Stable  COMPLICATIONS: no significant complications  DISPOSITION: The patient tolerated the procedure well without apparent complications and was ambulatory.  NOTES: tolerated well

## 2023-10-01 ENCOUNTER — Other Ambulatory Visit (INDEPENDENT_AMBULATORY_CARE_PROVIDER_SITE_OTHER): Payer: Self-pay | Admitting: Otolaryngology

## 2023-10-12 DIAGNOSIS — Z23 Encounter for immunization: Secondary | ICD-10-CM | POA: Diagnosis not present

## 2023-11-03 ENCOUNTER — Ambulatory Visit: Admitting: Pulmonary Disease

## 2023-11-03 ENCOUNTER — Ambulatory Visit: Admitting: *Deleted

## 2023-11-03 ENCOUNTER — Encounter: Payer: Self-pay | Admitting: Pulmonary Disease

## 2023-11-03 VITALS — BP 133/80 | HR 89 | Ht 65.0 in | Wt 178.0 lb

## 2023-11-03 DIAGNOSIS — R053 Chronic cough: Secondary | ICD-10-CM | POA: Diagnosis not present

## 2023-11-03 DIAGNOSIS — K219 Gastro-esophageal reflux disease without esophagitis: Secondary | ICD-10-CM | POA: Diagnosis not present

## 2023-11-03 DIAGNOSIS — J45909 Unspecified asthma, uncomplicated: Secondary | ICD-10-CM

## 2023-11-03 LAB — PULMONARY FUNCTION TEST
DL/VA % pred: 134 %
DL/VA: 5.4 ml/min/mmHg/L
DLCO cor % pred: 128 %
DLCO cor: 27.1 ml/min/mmHg
DLCO unc % pred: 128 %
DLCO unc: 27.1 ml/min/mmHg
FEF 25-75 Post: 3.73 L/s
FEF 25-75 Pre: 2.52 L/s
FEF2575-%Change-Post: 47 %
FEF2575-%Pred-Post: 229 %
FEF2575-%Pred-Pre: 155 %
FEV1-%Change-Post: 5 %
FEV1-%Pred-Post: 124 %
FEV1-%Pred-Pre: 117 %
FEV1-Post: 2.9 L
FEV1-Pre: 2.75 L
FEV1FVC-%Change-Post: 7 %
FEV1FVC-%Pred-Pre: 109 %
FEV6-%Change-Post: -2 %
FEV6-%Pred-Post: 111 %
FEV6-%Pred-Pre: 113 %
FEV6-Post: 3.4 L
FEV6-Pre: 3.48 L
FEV6FVC-%Change-Post: 0 %
FEV6FVC-%Pred-Post: 107 %
FEV6FVC-%Pred-Pre: 107 %
FVC-%Change-Post: -2 %
FVC-%Pred-Post: 103 %
FVC-%Pred-Pre: 105 %
FVC-Post: 3.41 L
FVC-Pre: 3.48 L
Post FEV1/FVC ratio: 85 %
Post FEV6/FVC ratio: 100 %
Pre FEV1/FVC ratio: 79 %
Pre FEV6/FVC Ratio: 100 %
RV % pred: 91 %
RV: 2.15 L
TLC % pred: 94 %
TLC: 5.68 L

## 2023-11-03 MED ORDER — FLUTICASONE-SALMETEROL 230-21 MCG/ACT IN AERO: 2.0000 | INHALATION_SPRAY | Freq: Two times a day (BID) | RESPIRATORY_TRACT | 12 refills | Status: DC

## 2023-11-03 MED ORDER — PREDNISONE 10 MG PO TABS: 40.0000 mg | ORAL_TABLET | Freq: Every day | ORAL | 0 refills | Status: DC

## 2023-11-03 MED ORDER — BUDESONIDE-FORMOTEROL FUMARATE 160-4.5 MCG/ACT IN AERO: 2.0000 | INHALATION_SPRAY | Freq: Two times a day (BID) | RESPIRATORY_TRACT | 12 refills | Status: DC

## 2023-11-03 NOTE — Patient Instructions (Signed)
 Full PFT performed today.

## 2023-11-03 NOTE — Patient Instructions (Addendum)
 Start prednisone  40mg  daily for 5 days  Start breyna  inhaler 2 puffs twice daily - rinse mouth out after each use  Your breathing tests are within normal limits  We will consider CT Chest scan in the future  Discuss with your GI doctor about the need for EGD to evaluate GERD and your cough  Follow up in 3 months, call sooner if needed

## 2023-11-03 NOTE — Progress Notes (Unsigned)
 Synopsis: Referred in July 2025 for Cough  Subjective:   PATIENT ID: Rick Campbell GENDER: male DOB: [birth year], MRN: [id number]  HPI  No chief complaint on file.  Rick Campbell is a 78 year old male, never smoker with history of seasonal allergies and GERD who is referred to pulmonary clinic for cough.   He has had a persistent, hacking, dry cough for two to three years, initially thought to be due to sinusitis. The cough is constant, with associated congestion, and causes vocal cord tightening and gagging, affecting his ability to speak. It is unproductive and primarily affects the upper chest. There is no wheezing, shortness of breath, or seasonal variation.  An ENT specialist noted age-related changes and throat edema but no polyps. He has not attended speech therapy. A two-month trial of Breo for suspected asthma was ineffective. He takes Protonix  for reflux and has eliminated caffeine, maintaining a five-hour gap between his last meal and bedtime.  He was exposed to secondhand smoke during childhood but denies personal smoking history, exposure to dust, chemicals, or pets. There is no history of eczema, skin rashes, or symptoms like wheezing, shortness of breath, or cough disturbing sleep.  Past Medical History:  Diagnosis Date   Adenomatous polyp of colon    Allergy     seasonal   Cataract    Chronic headache    Esophageal reflux    Hyperlipidemia    Lower urinary tract symptoms (LUTS)      Family History  Problem Relation Age of Onset   Lung cancer Mother        was a heavy smoker   Colon cancer Father        dx age 15s   Diabetes Father    Heart Problems Father        father CHF in his 66s   COPD Neg Hx    Arthritis Neg Hx    Prostate cancer Neg Hx    Esophageal cancer Neg Hx    Stomach cancer Neg Hx    Rectal cancer Neg Hx      Social History   Socioeconomic History   Marital status: Married    Spouse name: Not on file   Number of children: 2   Years of  education: Not on file   Highest education level: Master's degree (e.g., MA, MS, MEng, MEd, MSW, MBA)  Occupational History   Occupation: retired- Engineer, production    Comment: Marketing  Tobacco Use   Smoking status: Never   Smokeless tobacco: Never  Vaping Use   Vaping status: Never Used  Substance and Sexual Activity   Alcohol use: Yes    Alcohol/week: 2.0 standard drinks of alcohol    Types: 2 Glasses of wine per week    Comment: Glass of wine; weekend.  2/week   Drug use: No   Sexual activity: Yes  Other Topics Concern   Not on file  Social History Narrative   retired from [employer]  , lives w/ wife , both children in [place]   4 g-children         Social Drivers of Health   Financial Resource Strain: Low Risk  (09/02/2023)   Overall Financial Resource Strain (CARDIA)    Difficulty of Paying Living Expenses: Not hard at all  Food Insecurity: No Food Insecurity (09/02/2023)   Hunger Vital Sign    Worried About Running Out of Food in the Last Year: Never true    Ran Out of Food in the  Last Year: Never true  Transportation Needs: No Transportation Needs (09/02/2023)   PRAPARE - Administrator, Civil Service (Medical): No    Lack of Transportation (Non-Medical): No  Physical Activity: Sufficiently Active (09/02/2023)   Exercise Vital Sign    Days of Exercise per Week: 6 days    Minutes of Exercise per Session: 60 min  Stress: No Stress Concern Present (09/02/2023)   [employer] of Occupational Health - Occupational Stress Questionnaire    Feeling of Stress: Not at all  Social Connections: Socially Integrated (09/02/2023)   Social Connection and Isolation Panel    Frequency of Communication with Friends and Family: Three times a week    Frequency of Social Gatherings with Friends and Family: Three times a week    Attends Religious Services: More than 4 times per year    Active Member of Clubs or Organizations: Yes    Attends Banker Meetings: More than 4 times  per year    Marital Status: Married  Catering manager Violence: Not At Risk (07/21/2023)   Humiliation, Afraid, Rape, and Kick questionnaire    Fear of Current or Ex-Partner: No    Emotionally Abused: No    Physically Abused: No    Sexually Abused: No     Allergies  Allergen Reactions   Sulfonamide Derivatives Hives and Swelling    Urticaria     Outpatient Medications Prior to Visit  Medication Sig Dispense Refill   atorvastatin  (LIPITOR) 20 MG tablet Take 1 tablet (20 mg total) by mouth daily. 90 tablet 1   famotidine  (PEPCID ) 20 MG tablet Take 1 tablet (20 mg total) by mouth at bedtime. 30 tablet 5   fluticasone  (FLONASE ) 50 MCG/ACT nasal spray Place 2 sprays into both nostrils daily. 16 g 6   levocetirizine (XYZAL ) 5 MG tablet TAKE 1 TABLET(5 MG) BY MOUTH EVERY EVENING 90 tablet 3   Multiple Vitamins-Minerals (SENIOR MULTIVITAMIN PLUS PO) Take 1 tablet by mouth daily.     pantoprazole  (PROTONIX ) 40 MG tablet Take 1 tablet (40 mg total) by mouth daily before breakfast. 90 tablet 1   tamsulosin  (FLOMAX ) 0.4 MG CAPS capsule Take 1 capsule (0.4 mg total) by mouth daily after supper. 90 capsule 1   No facility-administered medications prior to visit.   Review of Systems  Constitutional:  Negative for chills, fever, malaise/fatigue and weight loss.  HENT:  Negative for congestion, sinus pain and sore throat.   Eyes: Negative.   Respiratory:  Positive for cough. Negative for hemoptysis, sputum production, shortness of breath and wheezing.   Cardiovascular:  Negative for chest pain, palpitations, orthopnea, claudication and leg swelling.  Gastrointestinal:  Positive for heartburn. Negative for abdominal pain, nausea and vomiting.  Genitourinary: Negative.   Musculoskeletal:  Negative for joint pain and myalgias.  Skin:  Negative for rash.  Neurological:  Negative for weakness.  Endo/Heme/Allergies: Negative.   Psychiatric/Behavioral: Negative.     Objective:   There were no  vitals filed for this visit.  Physical Exam Constitutional:      General: He is not in acute distress.    Appearance: Normal appearance.  Eyes:     General: No scleral icterus.    Conjunctiva/sclera: Conjunctivae normal.  Cardiovascular:     Rate and Rhythm: Normal rate and regular rhythm.  Pulmonary:     Breath sounds: No wheezing, rhonchi or rales.  Musculoskeletal:     Right lower leg: No edema.     Left lower leg: No  edema.  Skin:    General: Skin is warm and dry.  Neurological:     General: No focal deficit present.    CBC    Component Value Date/Time   WBC 5.3 02/09/2023 1001   RBC 5.09 02/09/2023 1001   HGB 16.1 02/09/2023 1001   HCT 48.2 02/09/2023 1001   PLT 179.0 02/09/2023 1001   PLT 206 05/21/2007 0000   MCV 94.8 02/09/2023 1001   MCH 31.7 06/21/2010 1621   MCHC 33.4 02/09/2023 1001   RDW 13.5 02/09/2023 1001   LYMPHSABS 1.4 02/09/2023 1001   MONOABS 0.4 02/09/2023 1001   EOSABS 0.1 02/09/2023 1001   BASOSABS 0.0 02/09/2023 1001      Latest Ref Rng & Units 02/09/2023   10:01 AM 01/30/2022    9:27 AM 01/30/2021    9:36 AM  BMP  Glucose 70 - 99 mg/dL 91  94  89   BUN 6 - 23 mg/dL 12  14  14    Creatinine 0.40 - 1.50 mg/dL 9.09  9.01  9.10   Sodium 135 - 145 mEq/L 140  140  141   Potassium 3.5 - 5.1 mEq/L 4.2  4.3  4.1   Chloride 96 - 112 mEq/L 104  104  103   CO2 19 - 32 mEq/L 29  29  31    Calcium  8.4 - 10.5 mg/dL 9.4  9.6  9.1    Chest imaging: CXR 10/13/22 The heart size and mediastinal contours are within normal limits. Both lungs are clear. The visualized skeletal structures are unremarkable.  PFT:     No data to display          Labs:  Path:  Echo:  Heart Catheterization:     Assessment & Plan:   No diagnosis found.  Discussion: Rick Campbell is a 78 year old male, never smoker with history of seasonal allergies and GERD who is referred to pulmonary clinic for cough.   Chronic cough  Chronic cough likely due to  reflux-related inflammation, evidenced by vocal cord edema and potential microaspiration. Breo ineffective. No wheezing or dyspnea. No smoking history, but childhood secondhand smoke exposure. - Order PFTs to assess lung function. - Consider high-resolution CT scan if PFTs abnormal. - Continue Protonix  30 minutes before breakfast. - Add famotidine  at bedtime. - Recommend elevating head of bed. - Discuss potential surgical intervention for hiatal hernia if identified.  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 - Encourage attendance at speech therapy. - Protonix  and famotidine  as above  Follow up in 2 months to review PFTs.  Dorn Chill, MD  Pulmonary & Critical Care Office: [phone]   Current Outpatient Medications:    atorvastatin  (LIPITOR) 20 MG tablet, Take 1 tablet (20 mg total) by mouth daily., Disp: 90 tablet, Rfl: 1   famotidine  (PEPCID ) 20 MG tablet, Take 1 tablet (20 mg total) by mouth at bedtime., Disp: 30 tablet, Rfl: 5   fluticasone  (FLONASE ) 50 MCG/ACT nasal spray, Place 2 sprays into both nostrils daily., Disp: 16 g, Rfl: 6   levocetirizine (XYZAL ) 5 MG tablet, TAKE 1 TABLET(5 MG) BY MOUTH EVERY EVENING, Disp: 90 tablet, Rfl: 3   Multiple Vitamins-Minerals (SENIOR MULTIVITAMIN PLUS PO), Take 1 tablet by mouth daily., Disp: , Rfl:    pantoprazole  (PROTONIX ) 40 MG tablet, Take 1 tablet (40 mg total) by mouth daily before breakfast., Disp: 90 tablet, Rfl: 1   tamsulosin  (FLOMAX ) 0.4 MG CAPS  capsule, Take 1 capsule (0.4 mg total) by mouth daily after supper., Disp: 90 capsule, Rfl: 1

## 2023-11-03 NOTE — Progress Notes (Signed)
 Full PFT performed today.

## 2023-11-04 ENCOUNTER — Encounter: Payer: Self-pay | Admitting: Pulmonary Disease

## 2023-11-05 NOTE — Progress Notes (Unsigned)
 Chief Complaint: Discuss colonoscopy  HPI:    Mr. Rick Campbell is a 78 year old male with a past medical history as listed below, known to Dr. Legrand, who was referred to me by Amon Aloysius BRAVO, MD for consideration of a repeat colonoscopy.    02/02/2004 EGD with duodenitis and esophagitis.  Apology showed mild chronic inflammation of the duodenum.    02/15/2021 colonoscopy done for family history of colon cancer in patient's father at the age of 2, no polyps on last colonoscopy 11/2015, 4 mm TA in 2012 with three 8-12 mm polyps in the mid transverse colon and distal transverse colon removed piecemeal, one 4 mm polyp in the proximal ascending colon, 2 10-15 mm polyps in the proximal ascending colon removed piecemeal and internal hemorrhoids.  Pathology showed sessile serrated polyp without cytologic dysplasia removed from the transverse colon, tubular adenoma removed in the ascending colon and sessile serrated polyps without dysplasia removed from the ascending colon.  Repeat recommended in 3 years.    02/09/2023 CBC and CMP normal.    09/09/2023 echo with LVEF 60-65% and mild to moderate aortic valve stenosis.  VTI measured 1.39 cm.    11/03/2023 patient followed with pulmonology for chronic cough.  At that time they discussed reflux without esophagitis.  He was given prednisone .  Discussed possible CT if symptoms persisted.  Continued on Pantoprazole  and Famotidine  for GERD.  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  Recommended to discuss possible EGD.    Today, patient presents to clinic and explains that he has had an increase in reflux symptoms over the past couple of months, they presented as hoarseness and he went to see ENT as well as a pulmonologist who all told him he has uncontrolled reflux.  Describes being on Aciphex  for years which was changed to Pantoprazole  40 mg daily about 4 to 5 years ago and recently added Pepcid  20 mg at night but continues with symptoms, worse  in the evenings.  He does raise the head of his bed and alter his eating habits.  He has dinner around 5:00.  Tells me he does not use NSAIDs on a regular basis.  No other changes in medication or increase in stress or anxiety.  In the evenings patient tells me he sometimes even loses his voice due to hoarseness.    Denies fever, chills or weight loss.  Past Medical History:  Diagnosis Date   Adenomatous polyp of colon    Allergy     seasonal   Cataract    Chronic headache    Esophageal reflux    Hyperlipidemia    Lower urinary tract symptoms (LUTS)     Past Surgical History:  Procedure Laterality Date   CHOLECYSTECTOMY  1998   EYE SURGERY Bilateral 11/27/2017   cataract and retina surgery   POLYPECTOMY      Current Outpatient Medications  Medication Sig Dispense Refill   atorvastatin  (LIPITOR) 20 MG tablet Take 1 tablet (20 mg total) by mouth daily. 90 tablet 1   famotidine  (PEPCID ) 20 MG tablet Take 1 tablet (20 mg total) by mouth at bedtime. 30 tablet 5   fluticasone  (FLONASE ) 50 MCG/ACT nasal spray Place 2 sprays into both nostrils daily. 16 g 6   fluticasone -salmeterol (ADVAIR HFA) 230-21 MCG/ACT inhaler Inhale 2 puffs into the lungs 2 (two) times daily. 1 each 12   levocetirizine (XYZAL ) 5 MG tablet TAKE 1 TABLET(5 MG) BY MOUTH EVERY EVENING 90 tablet 3   Multiple  Vitamins-Minerals (SENIOR MULTIVITAMIN PLUS PO) Take 1 tablet by mouth daily.     pantoprazole  (PROTONIX ) 40 MG tablet Take 1 tablet (40 mg total) by mouth daily before breakfast. 90 tablet 1   predniSONE  (DELTASONE ) 10 MG tablet Take 4 tablets (40 mg total) by mouth daily with breakfast. 20 tablet 0   tamsulosin  (FLOMAX ) 0.4 MG CAPS capsule Take 1 capsule (0.4 mg total) by mouth daily after supper. 90 capsule 1   No current facility-administered medications for this visit.    Allergies as of 11/06/2023 - Review Complete 11/04/2023  Allergen Reaction Noted   Sulfonamide derivatives Hives and Swelling 05/29/2008     Family History  Problem Relation Age of Onset   Lung cancer Mother        was a heavy smoker   Colon cancer Father        dx age 23s   Diabetes Father    Heart Problems Father        father CHF in his 100s   COPD Neg Hx    Arthritis Neg Hx    Prostate cancer Neg Hx    Esophageal cancer Neg Hx    Stomach cancer Neg Hx    Rectal cancer Neg Hx     Social History   Socioeconomic History   Marital status: Married    Spouse name: Not on file   Number of children: 2   Years of education: Not on file   Highest education level: Master's degree (e.g., MA, MS, MEng, MEd, MSW, MBA)  Occupational History   Occupation: retired- Engineer, production    Comment: Marketing  Tobacco Use   Smoking status: Never   Smokeless tobacco: Never  Vaping Use   Vaping status: Never Used  Substance and Sexual Activity   Alcohol use: Yes    Alcohol/week: 2.0 standard drinks of alcohol    Types: 2 Glasses of wine per week    Comment: Glass of wine; weekend.  2/week   Drug use: No   Sexual activity: Yes  Other Topics Concern   Not on file  Social History Narrative   retired from [employer]  , lives w/ wife , both children in [place]   4 g-children         Social Drivers of Health   Financial Resource Strain: Low Risk  (09/02/2023)   Overall Financial Resource Strain (CARDIA)    Difficulty of Paying Living Expenses: Not hard at all  Food Insecurity: No Food Insecurity (09/02/2023)   Hunger Vital Sign    Worried About Running Out of Food in the Last Year: Never true    Ran Out of Food in the Last Year: Never true  Transportation Needs: No Transportation Needs (09/02/2023)   PRAPARE - Administrator, Civil Service (Medical): No    Lack of Transportation (Non-Medical): No  Physical Activity: Sufficiently Active (09/02/2023)   Exercise Vital Sign    Days of Exercise per Week: 6 days    Minutes of Exercise per Session: 60 min  Stress: No Stress Concern Present (09/02/2023)   [employer] of  Occupational Health - Occupational Stress Questionnaire    Feeling of Stress: Not at all  Social Connections: Socially Integrated (09/02/2023)   Social Connection and Isolation Panel    Frequency of Communication with Friends and Family: Three times a week    Frequency of Social Gatherings with Friends and Family: Three times a week    Attends Religious Services: More than 4 times per  year    Active Member of Clubs or Organizations: Yes    Attends Banker Meetings: More than 4 times per year    Marital Status: Married  Catering manager Violence: Not At Risk (07/21/2023)   Humiliation, Afraid, Rape, and Kick questionnaire    Fear of Current or Ex-Partner: No    Emotionally Abused: No    Physically Abused: No    Sexually Abused: No    Review of Systems:    Constitutional: No weight loss, fever or chills Skin: No rash  Cardiovascular: No chest pain Respiratory: No SOB  Gastrointestinal: See HPI and otherwise negative Genitourinary: No dysuria  Neurological: No headache, dizziness or syncope Musculoskeletal: No new muscle or joint pain Hematologic: No bleeding  Psychiatric: No history of depression or anxiety   Physical Exam:  Vital signs: BP 130/66   Pulse 78   Ht 5' 5 (1.651 m)   Wt 182 lb (82.6 kg)   BMI 30.29 kg/m    Constitutional:   Pleasant Caucasian male appears to be in NAD, Well developed, Well nourished, alert and cooperative Head:  Normocephalic and atraumatic. Eyes:   PEERL, EOMI. No icterus. Conjunctiva pink. Ears:  Normal auditory acuity. Neck:  Supple Throat: Oral cavity and pharynx without inflammation, swelling or lesion.  Respiratory: Respirations even and unlabored. Lungs clear to auscultation bilaterally.   No wheezes, crackles, or rhonchi.  Cardiovascular: Normal S1, S2. No MRG. Regular rate and rhythm. No peripheral edema, cyanosis or pallor.  Gastrointestinal:  Soft, nondistended, nontender. No rebound or guarding. Normal bowel sounds. No  appreciable masses or hepatomegaly. Rectal:  Not performed.  Psychiatric: Oriented to person, place and time. Demonstrates good judgement and reason without abnormal affect or behaviors.  RELEVANT LABS AND IMAGING: CBC    Component Value Date/Time   WBC 5.3 02/09/2023 1001   RBC 5.09 02/09/2023 1001   HGB 16.1 02/09/2023 1001   HCT 48.2 02/09/2023 1001   PLT 179.0 02/09/2023 1001   PLT 206 05/21/2007 0000   MCV 94.8 02/09/2023 1001   MCH 31.7 06/21/2010 1621   MCHC 33.4 02/09/2023 1001   RDW 13.5 02/09/2023 1001   LYMPHSABS 1.4 02/09/2023 1001   MONOABS 0.4 02/09/2023 1001   EOSABS 0.1 02/09/2023 1001   BASOSABS 0.0 02/09/2023 1001    CMP     Component Value Date/Time   NA 140 02/09/2023 1001   K 4.2 02/09/2023 1001   CL 104 02/09/2023 1001   CO2 29 02/09/2023 1001   GLUCOSE 91 02/09/2023 1001   GLUCOSE 110 05/21/2007 0000   BUN 12 02/09/2023 1001   CREATININE 0.90 02/09/2023 1001   CREATININE 0.96 06/21/2010 1621   CALCIUM  9.4 02/09/2023 1001   PROT 6.5 02/09/2023 1001   ALBUMIN 4.3 02/09/2023 1001   AST 25 02/09/2023 1001   ALT 36 02/09/2023 1001   ALKPHOS 57 02/09/2023 1001   BILITOT 0.8 02/09/2023 1001   GFRNONAA 103.42 06/12/2009 1038    Assessment: 1.  History of adenomatous polyps: Last colonoscopy with multiple polyps, sessile serrated and adenomatous and quite large (10 to 15 mm), repeat recommended in 3 years, would be due in January 2026 2.  Family history of colon cancer in his father: Diagnosed in his 87s 3.  Chronic cough: Has been evaluated by ENT with evaluation showing reflux and vocal cord atrophy, no improvement in symptoms on Pantoprazole  and famotidine , followed with pulmonology with normal pulmonary function tests and recommended an EGD for further eval, describes hoarseness; consider  relation to GERD 4.  GERD: See above, last EGD in 2006 with gastritis and duodenitis  Plan: 1.  Increase Pantoprazole  to 40 mg twice daily, 30-60 minutes before  breakfast and dinner.  #60 with 3 refills 2.  Continue Famotidine  20 mg nightly for now 3.  Reviewed antireflux diet and lifestyle modifications. 4.  Discussed with patient if he sees no difference after a week of increased Pantoprazole  then would increase Famotidine  to 40 mg every morning and nightly, could have a new prescription #60 with 3 refills. 5.  If the measures above do not work then could consider a switch in agents to Dexilant 60 mg daily with additional Famotidine  6.  Patient will be arranged for an EGD and colonoscopy in January when he is due for his surveillance colonoscopy.  He was put in recall for these appointments.  He is to be scheduled in the [facility] with Dr. Legrand. 7.  Patient to follow in clinic with us  prior to procedures as needed.  Delon Failing, PA-C [facility] 11/05/2023, 2:02 PM  Cc: Amon Aloysius BRAVO, MD

## 2023-11-06 ENCOUNTER — Ambulatory Visit: Admitting: Physician Assistant

## 2023-11-06 ENCOUNTER — Encounter: Payer: Self-pay | Admitting: Physician Assistant

## 2023-11-06 VITALS — BP 130/66 | HR 78 | Ht 65.0 in | Wt 182.0 lb

## 2023-11-06 DIAGNOSIS — Z800 Family history of malignant neoplasm of digestive organs: Secondary | ICD-10-CM | POA: Diagnosis not present

## 2023-11-06 DIAGNOSIS — Z860101 Personal history of adenomatous and serrated colon polyps: Secondary | ICD-10-CM

## 2023-11-06 DIAGNOSIS — K219 Gastro-esophageal reflux disease without esophagitis: Secondary | ICD-10-CM

## 2023-11-06 DIAGNOSIS — R053 Chronic cough: Secondary | ICD-10-CM

## 2023-11-06 DIAGNOSIS — R490 Dysphonia: Secondary | ICD-10-CM

## 2023-11-06 MED ORDER — PANTOPRAZOLE SODIUM 40 MG PO TBEC: 40.0000 mg | DELAYED_RELEASE_TABLET | Freq: Two times a day (BID) | ORAL | 3 refills | Status: DC

## 2023-11-06 NOTE — Patient Instructions (Signed)
 We have sent the following medications to your pharmacy for you to pick up at your convenience: Pantoprazole  40 mg twice daily 30-60 minutes before breakfast and dinner.   Continue Famotidine  20 mg nightly before bed.   _______________________________________________________  If your blood pressure at your visit was 140/90 or greater, please contact your primary care physician to follow up on this.  _______________________________________________________  If you are age 77 or older, your body mass index should be between 23-30. Your Body mass index is 30.29 kg/m. If this is out of the aforementioned range listed, please consider follow up with your Primary Care Provider.  If you are age 76 or younger, your body mass index should be between 19-25. Your Body mass index is 30.29 kg/m. If this is out of the aformentioned range listed, please consider follow up with your Primary Care Provider.   ________________________________________________________  The [facility] providers would like to encourage you to use MYCHART to communicate with providers for non-urgent requests or questions.  Due to long hold times on the telephone, sending your provider a message by [facility] may be a faster and more efficient way to get a response.  Please allow 48 business hours for a response.  Please remember that this is for non-urgent requests.  _______________________________________________________  [facility] is using a team-based approach to care.  Your team is made up of your doctor and two to three APPS. Our APPS (Nurse Practitioners and Physician Assistants) work with your physician to ensure care continuity for you. They are fully qualified to address your health concerns and develop a treatment plan. They communicate directly with your gastroenterologist to care for you. Seeing the Advanced Practice Practitioners on your physician's team can help you by facilitating care more promptly, often  allowing for earlier appointments, access to diagnostic testing, procedures, and other specialty referrals.

## 2023-11-09 NOTE — Progress Notes (Signed)
 ____________________________________________________________  Attending physician addendum:  Thank you for sending this case to me. I have reviewed the entire note and agree with the plan.  He can be scheduled for the EGD and colonoscopy any time now since he is close enough to the January recall.  It will likely be difficult to determine the extent to which GERD is contributing to his hoarseness, especially if partial or no improvement on high dose acid suppression.  Victory Brand, MD  ____________________________________________________________

## 2023-11-10 ENCOUNTER — Telehealth: Payer: Self-pay | Admitting: *Deleted

## 2023-11-10 DIAGNOSIS — Z860101 Personal history of adenomatous and serrated colon polyps: Secondary | ICD-10-CM

## 2023-11-10 DIAGNOSIS — K219 Gastro-esophageal reflux disease without esophagitis: Secondary | ICD-10-CM

## 2023-11-10 DIAGNOSIS — R490 Dysphonia: Secondary | ICD-10-CM

## 2023-11-10 DIAGNOSIS — R053 Chronic cough: Secondary | ICD-10-CM

## 2023-11-10 NOTE — Telephone Encounter (Signed)
-----   Message from [place] D. Zehr sent at 11/10/2023  8:50 AM EDT ----- Per Dr. Legrand, okay to go ahead and schedule EGD and colonoscopy at any time with him in the [facility]. ----- Message ----- From: Legrand Victory LITTIE DOUGLAS, MD Sent: 11/09/2023   9:46 AM EDT To: Delon Hendricks Failing, PA

## 2023-11-11 MED ORDER — NA SULFATE-K SULFATE-MG SULF 17.5-3.13-1.6 GM/177ML PO SOLN: 1.0000 | Freq: Once | ORAL | 0 refills | Status: AC

## 2023-11-11 NOTE — Telephone Encounter (Signed)
 Spoke with patient and scheduled ECL for 12/28/23 @ 8 am. Instructions sent via Mychart. Suprep sent to pharmacy.

## 2023-11-19 ENCOUNTER — Encounter (INDEPENDENT_AMBULATORY_CARE_PROVIDER_SITE_OTHER): Payer: Self-pay

## 2023-11-19 ENCOUNTER — Ambulatory Visit (INDEPENDENT_AMBULATORY_CARE_PROVIDER_SITE_OTHER)

## 2023-11-19 VITALS — BP 155/74 | HR 74 | Ht 65.5 in | Wt 175.0 lb

## 2023-11-19 DIAGNOSIS — J383 Other diseases of vocal cords: Secondary | ICD-10-CM

## 2023-11-19 DIAGNOSIS — K219 Gastro-esophageal reflux disease without esophagitis: Secondary | ICD-10-CM | POA: Diagnosis not present

## 2023-11-19 DIAGNOSIS — R490 Dysphonia: Secondary | ICD-10-CM

## 2023-11-19 DIAGNOSIS — R053 Chronic cough: Secondary | ICD-10-CM | POA: Diagnosis not present

## 2023-11-19 NOTE — Progress Notes (Signed)
 HPI:   Rick Campbell is a 78 y.o. male who presents in follow-up being seen for chronic cough and loss of voice.  Patient states that he has not had any improvement in his cough or loss of voice since the nerve block 8 weeks ago.  Patient states he did visit the GI specialist who increased his reflux medication which has improved his symptoms slightly.  Patient also was seen by pulmonology and noted to have no issues with his lung function.  Patient states that he is becoming increasingly frustrated with his breathiness and loss of voice especially in the evening.  He notes that his grandchildren also noticed his loss of voice. Patient denies any SOB or difficulty breathing.    PMH/Meds/All/SocHx/FamHx/ROS: Past Medical History:  Diagnosis Date   Adenomatous polyp of colon    Allergy     seasonal   Cataract    Chronic headache    Esophageal reflux    Hyperlipidemia    Lower urinary tract symptoms (LUTS)    Past Surgical History:  Procedure Laterality Date   CHOLECYSTECTOMY  1998   EYE SURGERY Bilateral 11/27/2017   cataract and retina surgery   POLYPECTOMY     No family history of bleeding disorders, wound healing problems or difficulty with anesthesia.  Social Connections: Socially Integrated (09/02/2023)   Social Connection and Isolation Panel    Frequency of Communication with Friends and Family: Three times a week    Frequency of Social Gatherings with Friends and Family: Three times a week    Attends Religious Services: More than 4 times per year    Active Member of Clubs or Organizations: Yes    Attends Banker Meetings: More than 4 times per year    Marital Status: Married    Current Outpatient Medications:    atorvastatin  (LIPITOR) 20 MG tablet, Take 1 tablet (20 mg total) by mouth daily., Disp: 90 tablet, Rfl: 1   famotidine  (PEPCID ) 20 MG tablet, Take 1 tablet (20 mg total) by mouth at bedtime., Disp: 30 tablet, Rfl: 5   fluticasone  (FLONASE ) 50 MCG/ACT  nasal spray, Place 2 sprays into both nostrils daily., Disp: 16 g, Rfl: 6   Multiple Vitamins-Minerals (SENIOR MULTIVITAMIN PLUS PO), Take 1 tablet by mouth daily., Disp: , Rfl:    pantoprazole  (PROTONIX ) 40 MG tablet, Take 1 tablet (40 mg total) by mouth 2 (two) times daily before a meal., Disp: 60 tablet, Rfl: 3   predniSONE  (DELTASONE ) 10 MG tablet, Take 4 tablets (40 mg total) by mouth daily with breakfast., Disp: 20 tablet, Rfl: 0   tamsulosin  (FLOMAX ) 0.4 MG CAPS capsule, Take 1 capsule (0.4 mg total) by mouth daily after supper., Disp: 90 capsule, Rfl: 1   fluticasone -salmeterol (ADVAIR HFA) 230-21 MCG/ACT inhaler, Inhale 2 puffs into the lungs 2 (two) times daily. (Patient not taking: Reported on 11/19/2023), Disp: 1 each, Rfl: 12   levocetirizine (XYZAL ) 5 MG tablet, TAKE 1 TABLET(5 MG) BY MOUTH EVERY EVENING (Patient not taking: Reported on 11/19/2023), Disp: 90 tablet, Rfl: 3 A complete ROS was performed with pertinent positives/negatives noted in the HPI. The remainder of the ROS are negative.   Physical Exam:  BP (!) 155/74 (BP Location: Left Arm, Patient Position: Sitting, Cuff Size: Normal) Comment: white coat  Pulse 74   Ht 5' 5.5 (1.664 m)   Wt 175 lb (79.4 kg)   SpO2 96%   BMI 28.68 kg/m  General: Well developed, well nourished. No acute distress. Voice hoarse Head/Face:  Normocephalic. No sinus tenderness. Facial nerve intact and equal bilaterally. No facial lacerations. Eyes: PERRL, no scleral icterus or conjunctival hemorrhage. EOMI. Ears: No gross deformity. Normal external canal. Tympanic membrane in tact bilaterally Hearing: Normal speech reception.  Nose: No gross deformity or lesions. No purulent discharge. No turbinate hypertrophy. Mouth/Oropharynx: Lips without any lesions. Dentition good. No mucosal lesions within the oropharynx. No tonsillar enlargement, exudate, or lesions. Pharyngeal walls symmetrical. Uvula midline. Tongue midline without lesions. Larynx: See  TFL if applicable Nasopharynx: See TFL if applicable Neck: Trachea midline. No masses. No thyromegaly or nodules palpated. No crepitus. Lymphatic: No lymphadenopathy in the neck. Respiratory: No stridor or distress. Room air. Cardiovascular: Regular rate and rhythm. Extremities: No edema or cyanosis. Warm and well-perfused. Skin: No scars or lesions on face or neck. Neurologic: CN II-XII grossly intact. Moving all extremities without gross abnormality. Other:  Independent Review of Additional Tests or Records: None Procedures: Flexible Fiberoptic Laryngoscopy Procedure Note  Date of procedure 11/19/2023 Pre-Op Diagnosis: chronic cough, hoarseness    Post Op Diagnosis: same Procedure: Flexible Fiberoptic Laryngoscopy CPT 31575 - Mod 25 Surgeon: Adah Malkin, D.O. 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 It was passed along the floor of the nasal cavity to the nasopharynx. Torus tubarius was patent and the Fossa of Rosenmller was identified. The scope was then flexed caudally and advanced slowly through the nasopharynx, passed through the oropharynx, and down into the hypopharynx. The patient's oro- and nasopharynx were unremarkable with no signs of any gross lesions, edema, masses, or bleeding.  The base of tongue was visualized and no mass, ulceration or lesion was appreciated. The epiglottis did not demonstrate any mass, ulceration or lesion. The vallecula was also assessed with no mass, ulceration or lesion. The patient had good glottic closure upon phonation and no signs of aspiration or pooling of secretions. The patient was asked to  inspire and expire with the true vocal folds vibrating normally and without evidence of vocal fold dysfunction. The true vocal folds were noted to be atrophic bilaterally. The true and false vocal cords, interarytenoid, AE folds, and arytenoids did not demonstrate any significant edema or erythema. The patient was then asked to valsalva, and the pyriform sinuses were assessed which were unremarkable. The airway was patent and there was no evidence of compromise. The scope was then slowly withdrawn from the patient. The patient tolerated the procedure well and there were no complications.  Disposition: Stable   Impression & Plans: DARRYLE DENNIE is a 78 y.o. male with dysphonia and chronic cough. Patient has undergone SLN block without relief of symptoms and would like to explore alternative options.   Hoarseness, chronic dysphonia, VF atrophy - Recommend voice therapy, referral placed - Will plan for injection laryngoplasty in OR  GERD, LPR - Continue reflux management with Protonix  - Lifestyle modifications including elevating HOB and avoid spicy, greasy, and acidic foods  Follow-up after procedure.  Adah Malkin, DO [facility]

## 2023-12-07 ENCOUNTER — Other Ambulatory Visit: Payer: Self-pay

## 2023-12-07 MED ORDER — NA SULFATE-K SULFATE-MG SULF 17.5-3.13-1.6 GM/177ML PO SOLN: 1.0000 | Freq: Once | ORAL | 0 refills | Status: AC

## 2023-12-28 ENCOUNTER — Ambulatory Visit: Admitting: Gastroenterology

## 2023-12-28 ENCOUNTER — Encounter: Payer: Self-pay | Admitting: Gastroenterology

## 2023-12-28 VITALS — BP 141/76 | HR 66 | Temp 97.3°F | Resp 16 | Ht 65.0 in | Wt 182.0 lb

## 2023-12-28 DIAGNOSIS — K219 Gastro-esophageal reflux disease without esophagitis: Secondary | ICD-10-CM

## 2023-12-28 DIAGNOSIS — D122 Benign neoplasm of ascending colon: Secondary | ICD-10-CM | POA: Diagnosis not present

## 2023-12-28 DIAGNOSIS — K317 Polyp of stomach and duodenum: Secondary | ICD-10-CM

## 2023-12-28 DIAGNOSIS — K648 Other hemorrhoids: Secondary | ICD-10-CM | POA: Diagnosis not present

## 2023-12-28 DIAGNOSIS — K635 Polyp of colon: Secondary | ICD-10-CM

## 2023-12-28 DIAGNOSIS — Z860101 Personal history of adenomatous and serrated colon polyps: Secondary | ICD-10-CM

## 2023-12-28 DIAGNOSIS — D123 Benign neoplasm of transverse colon: Secondary | ICD-10-CM | POA: Diagnosis not present

## 2023-12-28 DIAGNOSIS — Z1211 Encounter for screening for malignant neoplasm of colon: Secondary | ICD-10-CM

## 2023-12-28 DIAGNOSIS — E785 Hyperlipidemia, unspecified: Secondary | ICD-10-CM | POA: Diagnosis not present

## 2023-12-28 DIAGNOSIS — Z800 Family history of malignant neoplasm of digestive organs: Secondary | ICD-10-CM | POA: Diagnosis not present

## 2023-12-28 DIAGNOSIS — R053 Chronic cough: Secondary | ICD-10-CM | POA: Diagnosis not present

## 2023-12-28 DIAGNOSIS — K5730 Diverticulosis of large intestine without perforation or abscess without bleeding: Secondary | ICD-10-CM | POA: Diagnosis not present

## 2023-12-28 DIAGNOSIS — R490 Dysphonia: Secondary | ICD-10-CM

## 2023-12-28 MED ORDER — SODIUM CHLORIDE 0.9 % IV SOLN: 500.0000 mL | Freq: Once | INTRAVENOUS | Status: DC

## 2023-12-28 NOTE — Op Note (Signed)
 [facility] Patient Name: Rick Campbell Procedure Date: 12/28/2023 9:08 AM MRN: [id number] Endoscopist: Victory L. Legrand , MD, [id number] Age: 78 Referring MD:  Date of Birth: [birth year] Gender: Male Account #: [id number] Procedure:                Upper GI endoscopy Indications:              Esophageal reflux symptoms that persist despite                            appropriate therapy, Chronic cough and hoarseness                           See recent office note for clinical details                           Patient reports today that heartburn and cough are                            somewhat improved on increased dose of acid                            suppression since the visit last month. Medicines:                Monitored Anesthesia Care Procedure:                Pre-Anesthesia Assessment:                           - Prior to the procedure, a History and Physical                            was performed, and patient medications and                            allergies were reviewed. The patient's tolerance of                            previous anesthesia was also reviewed. The risks                            and benefits of the procedure and the sedation                            options and risks were discussed with the patient.                            All questions were answered, and informed consent                            was obtained. Prior Anticoagulants: The patient has                            taken no anticoagulant or antiplatelet agents. ASA  Grade Assessment: II - A patient with mild systemic                            disease. After reviewing the risks and benefits,                            the patient was deemed in satisfactory condition to                            undergo the procedure.                           After obtaining informed consent, the endoscope was                            passed under direct vision.  Throughout the                            procedure, the patient's blood pressure, pulse, and                            oxygen saturations were monitored continuously. The                            GIF [id number] #[id number] was introduced through the                            mouth, and advanced to the second part of duodenum.                            The upper GI endoscopy was accomplished without                            difficulty. The patient tolerated the procedure                            well. Scope In: Scope Out: Findings:                 The esophagus was normal.                           Multiple diminutive sessile polyps were found in                            the gastric fundus and in the gastric body.                            Appearance is typical for fundic gland polyps                            (examined under WL and NBI)                           The exam of the stomach was otherwise normal.  The cardia and gastric fundus were normal on                            retroflexion.                           The examined duodenum was normal. Complications:            No immediate complications. Estimated Blood Loss:     Estimated blood loss: none. Impression:               - Normal esophagus.                           - Multiple gastric polyps.                           - Normal examined duodenum.                           - No specimens collected.                           It is difficult to determine the extent to which                            GERD is contributing to this patient's hoarseness                            and cough (with reported vocal cord thinning on ENT                            exam). Recommendation:           - Patient has a contact number available for                            emergencies. The signs and symptoms of potential                            delayed complications were discussed with the                             patient. Return to normal activities tomorrow.                            Written discharge instructions were provided to the                            patient.                           - Resume previous diet.                           - Continue present medications.                           -  Return to my office at the next available                            appointment to discuss long-term GERD management as                            well as further testing with pH/impedance. Jandel Patriarca L. Legrand, MD 12/28/2023 10:08:47 AM This report has been signed electronically.

## 2023-12-28 NOTE — Progress Notes (Signed)
 Sedate, gd SR, tolerated procedure well, VSS, report to RN

## 2023-12-28 NOTE — Op Note (Signed)
 [facility] Patient Name: Rick Campbell Procedure Date: 12/28/2023 9:17 AM MRN: [id number] Endoscopist: Victory L. Legrand , MD, [id number] Age: 78 Referring MD:  Date of Birth: Dec 22, 1945 Gender: Male Account #: [id number] Procedure:                Colonoscopy Indications:              High risk colon cancer surveillance: Personal                            history of colonic polyps                           Multiple adenomatous and serrated polyps January                            2023                           Family history of colorectal cancer( father) Medicines:                Monitored Anesthesia Care Procedure:                Pre-Anesthesia Assessment:                           - Prior to the procedure, a History and Physical                            was performed, and patient medications and                            allergies were reviewed. The patient's tolerance of                            previous anesthesia was also reviewed. The risks                            and benefits of the procedure and the sedation                            options and risks were discussed with the patient.                            All questions were answered, and informed consent                            was obtained. Prior Anticoagulants: The patient has                            taken no anticoagulant or antiplatelet agents. ASA                            Grade Assessment: II - A patient with mild systemic  disease. After reviewing the risks and benefits,                            the patient was deemed in satisfactory condition to                            undergo the procedure.                           After obtaining informed consent, the colonoscope                            was passed under direct vision. Throughout the                            procedure, the patient's blood pressure, pulse, and                            oxygen  saturations were monitored continuously. The                            CF [id number] #[id number] was introduced through the anus                            and advanced to the the cecum, identified by                            appendiceal orifice and ileocecal valve. The                            colonoscopy was somewhat difficult due to a                            redundant colon. Successful completion of the                            procedure was aided by changing the patient's                            position, using manual pressure and straightening                            and shortening the scope to obtain bowel loop                            reduction. The patient tolerated the procedure                            well. The quality of the bowel preparation was                            good. The ileocecal valve, appendiceal orifice, and  rectum were photographed. Scope In: 9:31:16 AM Scope Out: 9:51:45 AM Scope Withdrawal Time: 0 hours 12 minutes 38 seconds  Total Procedure Duration: 0 hours 20 minutes 29 seconds  Findings:                 The perianal and digital rectal examinations were                            normal.                           Repeat examination of right colon under NBI                            performed.                           A diminutive polyp was found in the proximal                            ascending colon. The polyp was semi-sessile. The                            polyp was removed with a cold biopsy forceps.                            Resection and retrieval were complete.                           Multiple diverticula were found in the left colon.                           A 4 mm polyp was found in the proximal transverse                            colon. The polyp was semi-sessile. The polyp was                            removed with a cold snare. Resection and retrieval                            were  complete.                           Internal hemorrhoids were found during                            retroflexion. The hemorrhoids were small.                           The exam was otherwise without abnormality on                            direct and retroflexion views. Complications:            No immediate complications. Estimated Blood Loss:     Estimated blood  loss was minimal. Impression:               - One diminutive polyp in the proximal ascending                            colon, removed with a cold biopsy forceps. Resected                            and retrieved.                           - Diverticulosis in the left colon.                           - One 4 mm polyp in the proximal transverse colon,                            removed with a cold snare. Resected and retrieved.                           - Internal hemorrhoids.                           - The examination was otherwise normal on direct                            and retroflexion views. Recommendation:           - Patient has a contact number available for                            emergencies. The signs and symptoms of potential                            delayed complications were discussed with the                            patient. Return to normal activities tomorrow.                            Written discharge instructions were provided to the                            patient.                           - Resume previous diet.                           - Continue present medications.                           - Await pathology results.                           - No repeat surveillance colonoscopy due to age,  current guidelines and low risk findings today.                           - See the other procedure note for documentation of                            additional recommendations. Hiilei Gerst L. Legrand, MD 12/28/2023 9:55:51 AM This report has been signed electronically.

## 2023-12-28 NOTE — Patient Instructions (Addendum)
 Resume previous diet.  Continue present medications.  Await pathology results.   No repeat surveillance colonoscopy due to age, current guidelines and low risk findings today.   Return to office at the next available appointment to discuss long-term GERD management.  YOU HAD AN ENDOSCOPIC PROCEDURE TODAY AT THE [facility]:   Refer to the procedure report that was given to you for any specific questions about what was found during the examination.  If the procedure report does not answer your questions, please call your gastroenterologist to clarify.  If you requested that your care partner not be given the details of your procedure findings, then the procedure report has been included in a sealed envelope for you to review at your convenience later.  YOU SHOULD EXPECT: Some feelings of bloating in the abdomen. Passage of more gas than usual.  Walking can help get rid of the air that was put into your GI tract during the procedure and reduce the bloating. If you had a lower endoscopy (such as a colonoscopy or flexible sigmoidoscopy) you may notice spotting of blood in your stool or on the toilet paper. If you underwent a bowel prep for your procedure, you may not have a normal bowel movement for a few days.  Please Note:  You might notice some irritation and congestion in your nose or some drainage.  This is from the oxygen used during your procedure.  There is no need for concern and it should clear up in a day or so.  SYMPTOMS TO REPORT IMMEDIATELY:  Following lower endoscopy (colonoscopy or flexible sigmoidoscopy):  Excessive amounts of blood in the stool  Significant tenderness or worsening of abdominal pains  Swelling of the abdomen that is new, acute  Fever of 100F or higher  Following upper endoscopy (EGD)  Vomiting of blood or coffee ground material  New chest pain or pain under the shoulder blades  Painful or persistently difficult swallowing  New shortness of  breath  Fever of 100F or higher  Black, tarry-looking stools  For urgent or emergent issues, a gastroenterologist can be reached at any hour by calling [phone]. Do not use MyChart messaging for urgent concerns.    DIET:  We do recommend a small meal at first, but then you may proceed to your regular diet.  Drink plenty of fluids but you should avoid alcoholic beverages for 24 hours.  ACTIVITY:  You should plan to take it easy for the rest of today and you should NOT DRIVE or use heavy machinery until tomorrow (because of the sedation medicines used during the test).    FOLLOW UP: Our staff will call the number listed on your records the next business day following your procedure.  We will call around 7:15- 8:00 am to check on you and address any questions or concerns that you may have regarding the information given to you following your procedure. If we do not reach you, we will leave a message.     If any biopsies were taken you will be contacted by phone or by letter within the next 1-3 weeks.  Please call us  at [phone] if you have not heard about the biopsies in 3 weeks.    SIGNATURES/CONFIDENTIALITY: You and/or your care partner have signed paperwork which will be entered into your electronic medical record.  These signatures attest to the fact that that the information above on your After Visit Summary has been reviewed and is understood.  Full responsibility of  the confidentiality of this discharge information lies with you and/or your care-partner.

## 2023-12-28 NOTE — Progress Notes (Signed)
 Called to room to assist during endoscopic procedure.  Patient ID and intended procedure confirmed with present staff. Received instructions for my participation in the procedure from the performing physician.

## 2023-12-28 NOTE — Progress Notes (Signed)
 History and Physical:  This patient presents for endoscopic testing for: Encounter Diagnoses  Name Primary?   History of adenomatous polyp of colon Yes   Gastroesophageal reflux disease without esophagitis    Hoarseness    Chronic cough     78 year old man here today for surveillance colonoscopy with history of adenomatous and serrated polyps in January 2023 as well as upper endoscopic evaluation of chronic cough and hoarseness.  Clinical details outlined in APP office note last month with no significant clinical changes since then. Today he reports that the increase dose of acid suppression has decreased the heartburn and cough somewhat Patient is otherwise without complaints or active issues today.   Past Medical History: Past Medical History:  Diagnosis Date   Adenomatous polyp of colon    Allergy     seasonal   Cataract    Chronic headache    Esophageal reflux    Hyperlipidemia    Lower urinary tract symptoms (LUTS)      Past Surgical History: Past Surgical History:  Procedure Laterality Date   CHOLECYSTECTOMY  1998   EYE SURGERY Bilateral 11/27/2017   cataract and retina surgery   POLYPECTOMY      Allergies: Allergies  Allergen Reactions   Sulfonamide Derivatives Hives and Swelling    Urticaria    Outpatient Meds: Current Outpatient Medications  Medication Sig Dispense Refill   atorvastatin  (LIPITOR) 20 MG tablet Take 1 tablet (20 mg total) by mouth daily. 90 tablet 1   famotidine  (PEPCID ) 20 MG tablet Take 1 tablet (20 mg total) by mouth at bedtime. 30 tablet 5   fluticasone  (FLONASE ) 50 MCG/ACT nasal spray Place 2 sprays into both nostrils daily. 16 g 6   levocetirizine (XYZAL ) 5 MG tablet TAKE 1 TABLET(5 MG) BY MOUTH EVERY EVENING 90 tablet 3   Multiple Vitamins-Minerals (SENIOR MULTIVITAMIN PLUS PO) Take 1 tablet by mouth daily.     pantoprazole  (PROTONIX ) 40 MG tablet Take 1 tablet (40 mg total) by mouth 2 (two) times daily before a meal. 60 tablet 3    tamsulosin  (FLOMAX ) 0.4 MG CAPS capsule Take 1 capsule (0.4 mg total) by mouth daily after supper. 90 capsule 1   Current Facility-Administered Medications  Medication Dose Route Frequency Provider Last Rate Last Admin   0.9 %  sodium chloride  infusion  500 mL Intravenous Once Danis, Libbi Towner L III, MD          ___________________________________________________________________ Objective   Exam:  BP 135/75   Pulse 75   Temp (!) 97.3 F (36.3 C)   Ht 5' 5 (1.651 m)   Wt 182 lb (82.6 kg)   SpO2 99%   BMI 30.29 kg/m   CV: regular , S1/S2 Resp: clear to auscultation bilaterally, normal RR and effort noted GI: soft, no tenderness, with active bowel sounds.   Assessment: Encounter Diagnoses  Name Primary?   History of adenomatous polyp of colon Yes   Gastroesophageal reflux disease without esophagitis    Hoarseness    Chronic cough      Plan: Colonoscopy EGD  The benefits and risks of the planned procedure(s) were described in detail with the patient or (when appropriate) their health care proxy.  Risks were outlined as including, but not limited to, bleeding, infection, perforation, adverse medication reaction leading to cardiac or pulmonary decompensation, pancreatitis (if ERCP).  The limitation of incomplete mucosal visualization was also discussed.  No guarantees or warranties were given.  The patient was provided an opportunity to ask questions and  all were answered. The patient agreed with the plan.   The patient is appropriate for an endoscopic procedure in the ambulatory setting.   - Victory Brand, MD

## 2023-12-29 ENCOUNTER — Telehealth: Payer: Self-pay | Admitting: *Deleted

## 2023-12-29 NOTE — Telephone Encounter (Signed)
 Post procedure follow up call placed, no answer and left VM.

## 2023-12-30 LAB — SURGICAL PATHOLOGY

## 2024-01-01 ENCOUNTER — Other Ambulatory Visit (INDEPENDENT_AMBULATORY_CARE_PROVIDER_SITE_OTHER): Payer: Self-pay | Admitting: Otolaryngology

## 2024-01-03 ENCOUNTER — Ambulatory Visit: Payer: Self-pay | Admitting: Gastroenterology

## 2024-01-04 ENCOUNTER — Other Ambulatory Visit: Payer: Self-pay

## 2024-01-04 MED ORDER — FLUTICASONE-SALMETEROL 115-21 MCG/ACT IN AERO: 2.0000 | INHALATION_SPRAY | Freq: Two times a day (BID) | RESPIRATORY_TRACT | 12 refills | Status: DC

## 2024-01-12 ENCOUNTER — Ambulatory Visit: Admitting: Internal Medicine

## 2024-02-08 ENCOUNTER — Ambulatory Visit: Admitting: Pulmonary Disease

## 2024-02-10 ENCOUNTER — Ambulatory Visit: Admitting: Internal Medicine

## 2024-02-16 ENCOUNTER — Encounter: Payer: Self-pay | Admitting: Gastroenterology

## 2024-02-16 ENCOUNTER — Ambulatory Visit: Admitting: Gastroenterology

## 2024-02-16 VITALS — BP 120/54 | HR 64 | Ht 65.0 in | Wt 182.4 lb

## 2024-02-16 DIAGNOSIS — R053 Chronic cough: Secondary | ICD-10-CM | POA: Diagnosis not present

## 2024-02-16 DIAGNOSIS — K219 Gastro-esophageal reflux disease without esophagitis: Secondary | ICD-10-CM | POA: Diagnosis not present

## 2024-02-16 NOTE — Patient Instructions (Signed)
 _______________________________________________________  If your blood pressure at your visit was 140/90 or greater, please contact your primary care physician to follow up on this.  _______________________________________________________  If you are age 79 or older, your body mass index should be between 23-30. Your Body mass index is 30.35 kg/m. If this is out of the aforementioned range listed, please consider follow up with your Primary Care Provider.  If you are age 41 or younger, your body mass index should be between 19-25. Your Body mass index is 30.35 kg/m. If this is out of the aformentioned range listed, please consider follow up with your Primary Care Provider.   ________________________________________________________  The [facility] providers would like to encourage you to use MYCHART to communicate with providers for non-urgent requests or questions.  Due to long hold times on the telephone, sending your provider a message by [facility] may be a faster and more efficient way to get a response.  Please allow 48 business hours for a response.  Please remember that this is for non-urgent requests.  _______________________________________________________  [facility] is using a team-based approach to care.  Your team is made up of your doctor and two to three APPS. Our APPS (Nurse Practitioners and Physician Assistants) work with your physician to ensure care continuity for you. They are fully qualified to address your health concerns and develop a treatment plan. They communicate directly with your gastroenterologist to care for you. Seeing the Advanced Practice Practitioners on your physician's team can help you by facilitating care more promptly, often allowing for earlier appointments, access to diagnostic testing, procedures, and other specialty referrals.    Thank you for trusting me with your gastrointestinal care!    Dr. Victory Legrand DOUGLAS [facility]

## 2024-02-16 NOTE — Progress Notes (Signed)
 "     [facility] Progress Note  Chief Complaint: Chronic cough and GERD  Subjective  Prior history  Years of reported GERD on Aciphex , more recent chronic cough with hoarseness seen by pulmonary and ENT and told it was reflux.  Normal PFTs, reported vocal cord thinning on laryngoscopy by ENT Clinical details in 11/06/2018 5 GI APP note Cough and hoarseness minimally improved on high-dose PPI with additional H2 RA EGD 12/28/2023 normal except incidental fundic gland polyps  Discussed the use of AI scribe software for clinical note transcription with the patient, who gave verbal consent to proceed.  History of Present Illness Rick Campbell is a 79 year old male with gastroesophageal reflux disease who presents for follow-up of chronic throat symptoms.  Chronic Throat Symptoms: - Persistent raspiness, hoarseness, and constant cough present for an extended period - Evaluated by ENT and pulmonology - No dysphagia, odynophagia, or sensation of lump/fullness in the throat - Appetite remains good  Gastroesophageal Reflux Disease Management: - Twice daily proton pump inhibitor (pantoprazole  or omeprazole ) - Nighttime famotidine  added in October - Acid suppression dose increased prior to endoscopy in December - Ongoing lifestyle modifications: weight loss, dietary changes, head-of-bed elevation - Improvement in symptoms and current condition is well controlled  Respiratory Symptoms: - Inhaler initiated last year for respiratory symptoms - Switched to Breyna  in the fall with symptomatic improvement - Pulmonary function testing performed previously  Reflux Triggers: - Red wine identified as a trigger for reflux symptoms - Increased sensitivity to red wine over the past six months  Rick Campbell tells me today that since I last saw him for the procedure he is feeling considerably better.  No feeling of lump in the throat, chronic cough vocal quality are improved.  He does not know if it was  from any of the acid suppression medicine versus a new inhaler that he got and started around the same time in the fall.  Overall he is very pleased with how he feels and would like to know how to proceed going forward.  Denies dysphagia or odynophagia loss of appetite or vomiting  ROS: Cardiovascular:  no chest pain Respiratory: no dyspnea.  Cough is improved as noted above  The patient's Past Medical, Family and Social History were reviewed and are on file in the EMR. Past Medical History:  Diagnosis Date   Adenomatous polyp of colon    Allergy     seasonal   Cataract    Chronic headache    Esophageal reflux    Hyperlipidemia    Lower urinary tract symptoms (LUTS)     Past Surgical History:  Procedure Laterality Date   CHOLECYSTECTOMY  1998   EYE SURGERY Bilateral 11/27/2017   cataract and retina surgery   POLYPECTOMY       Objective:  Med list reviewed Current Medications[1]   Vital signs in last 24 hrs: Vitals:   02/16/24 0831  BP: (!) 120/54  Pulse: 64   Wt Readings from Last 3 Encounters:  02/16/24 182 lb 6 oz (82.7 kg)  12/28/23 182 lb (82.6 kg)  11/19/23 175 lb (79.4 kg)    Physical Exam  Well-appearing, normal vocal quality No additional exam.  Entire visit spent in review of symptoms, results and plan   Labs:   ___________________________________________ Radiologic studies:   ____________________________________________ Other:   _____________________________________________   Encounter Diagnoses  Name Primary?   Gastroesophageal reflux disease without esophagitis Yes   Chronic cough     Assessment & Plan  Gastroesophageal reflux disease Chronic GERD with atypical laryngeal and respiratory symptoms, well controlled with lifestyle modifications and acid suppression.  Likely multifactorial etiology with reflux contributing but not solely responsible. No further diagnostic testing or intervention needed.  At this time. Even if he underwent  pH and manometry studies, we would not be able to determine whether reflux alone is contributing to the symptoms or other factors like upper airway cough syndrome or allergic trigger is also contributing. - Discontinued nighttime famotidine . - Continued pantoprazole  twice daily, plan to reduce to once daily based on convenience. - Advised to monitor symptoms and update via patient portal in three months or sooner if symptoms worsen. - Reinforced lifestyle modifications: dietary management, weight control, bed elevation. - Deferred additional diagnostic testing due to symptom improvement.  He will check in with us  by portal message in a few months or sooner if needed.  I spent total of 20 minutes in both face-to-face (15 minutes interview/exam) and non-face-to-face (5 minutes chart review, care coordination, documentation)  activities, excluding procedures performed, for the visit on the date of this encounter.   Rick Campbell     [1]  Current Outpatient Medications:    atorvastatin  (LIPITOR) 20 MG tablet, Take 1 tablet (20 mg total) by mouth daily., Disp: 90 tablet, Rfl: 1   famotidine  (PEPCID ) 20 MG tablet, Take 1 tablet (20 mg total) by mouth at bedtime., Disp: 30 tablet, Rfl: 5   fluticasone  (FLONASE ) 50 MCG/ACT nasal spray, SHAKE LIQUID AND USE 2 SPRAYS IN EACH NOSTRIL DAILY, Disp: 48 g, Rfl: 0   levocetirizine (XYZAL ) 5 MG tablet, TAKE 1 TABLET(5 MG) BY MOUTH EVERY EVENING, Disp: 90 tablet, Rfl: 3   Multiple Vitamins-Minerals (SENIOR MULTIVITAMIN PLUS PO), Take 1 tablet by mouth daily., Disp: , Rfl:    pantoprazole  (PROTONIX ) 40 MG tablet, Take 1 tablet (40 mg total) by mouth 2 (two) times daily before a meal., Disp: 60 tablet, Rfl: 3   tamsulosin  (FLOMAX ) 0.4 MG CAPS capsule, Take 1 capsule (0.4 mg total) by mouth daily after supper., Disp: 90 capsule, Rfl: 1  "

## 2024-02-19 ENCOUNTER — Other Ambulatory Visit: Payer: Self-pay | Admitting: Internal Medicine

## 2024-02-23 ENCOUNTER — Ambulatory Visit: Admitting: Internal Medicine

## 2024-02-24 ENCOUNTER — Encounter: Payer: Self-pay | Admitting: Internal Medicine

## 2024-02-24 ENCOUNTER — Ambulatory Visit: Admitting: Internal Medicine

## 2024-02-24 VITALS — BP 122/82 | HR 57 | Temp 97.9°F | Resp 16 | Ht 65.0 in | Wt 183.2 lb

## 2024-02-24 DIAGNOSIS — I350 Nonrheumatic aortic (valve) stenosis: Secondary | ICD-10-CM | POA: Diagnosis not present

## 2024-02-24 DIAGNOSIS — E785 Hyperlipidemia, unspecified: Secondary | ICD-10-CM

## 2024-02-24 DIAGNOSIS — K219 Gastro-esophageal reflux disease without esophagitis: Secondary | ICD-10-CM

## 2024-02-24 LAB — LIPID PANEL
Cholesterol: 157 mg/dL (ref 28–200)
HDL: 54.3 mg/dL
LDL Cholesterol: 89 mg/dL (ref 10–99)
NonHDL: 102.61
Total CHOL/HDL Ratio: 3
Triglycerides: 66 mg/dL (ref 10.0–149.0)
VLDL: 13.2 mg/dL (ref 0.0–40.0)

## 2024-02-24 LAB — CBC WITH DIFFERENTIAL/PLATELET
Basophils Absolute: 0 10*3/uL (ref 0.0–0.1)
Basophils Relative: 0.4 % (ref 0.0–3.0)
Eosinophils Absolute: 0.1 10*3/uL (ref 0.0–0.7)
Eosinophils Relative: 1.2 % (ref 0.0–5.0)
HCT: 47.4 % (ref 39.0–52.0)
Hemoglobin: 15.8 g/dL (ref 13.0–17.0)
Lymphocytes Relative: 27.7 % (ref 12.0–46.0)
Lymphs Abs: 1.7 10*3/uL (ref 0.7–4.0)
MCHC: 33.3 g/dL (ref 30.0–36.0)
MCV: 94.1 fl (ref 78.0–100.0)
Monocytes Absolute: 0.5 10*3/uL (ref 0.1–1.0)
Monocytes Relative: 8.5 % (ref 3.0–12.0)
Neutro Abs: 3.8 10*3/uL (ref 1.4–7.7)
Neutrophils Relative %: 62.2 % (ref 43.0–77.0)
Platelets: 181 10*3/uL (ref 150.0–400.0)
RBC: 5.03 Mil/uL (ref 4.22–5.81)
RDW: 13.4 % (ref 11.5–15.5)
WBC: 6 10*3/uL (ref 4.0–10.5)

## 2024-02-24 LAB — BASIC METABOLIC PANEL WITH GFR
BUN: 12 mg/dL (ref 6–23)
CO2: 31 meq/L (ref 19–32)
Calcium: 9.5 mg/dL (ref 8.4–10.5)
Chloride: 105 meq/L (ref 96–112)
Creatinine, Ser: 0.92 mg/dL (ref 0.40–1.50)
GFR: 79.53 mL/min
Glucose, Bld: 89 mg/dL (ref 70–99)
Potassium: 4.2 meq/L (ref 3.5–5.1)
Sodium: 141 meq/L (ref 135–145)

## 2024-02-24 LAB — AST: AST: 20 U/L (ref 5–37)

## 2024-02-24 LAB — ALT: ALT: 28 U/L (ref 3–53)

## 2024-02-24 NOTE — Assessment & Plan Note (Signed)
 GERD: EGD 12/2023: Multiple polyps, otherwise negative, no biopsies taken. GI recommended to stop famotidine  and decrease PPIs to once daily.  He also changed his diet.  Doing well, check CBC. High cholesterol Managed with atorvastatin .  Checking labs. Aortic stenosis: Per echo last year, patient aware of red flag symptoms such as fatigue, shortness of breath, or chest pain.  Next echo at the 2-year mark General Health Maintenance Had a flu shot and COVID booster recently Had a colonoscopy December 2025 next per GI. PSAs per urology RTC 6 months

## 2024-02-24 NOTE — Progress Notes (Signed)
" ° °  Subjective:    Patient ID: Rick Campbell, male    DOB: 1945/12/01, 79 y.o.   MRN: [id number]  DOS:  02/24/2024 Follow-up  Discussed the use of AI scribe software for clinical note transcription with the patient, who gave verbal consent to proceed.  History of Present Illness [name] is a 79 year old male who presents for follow-up after seeing multiple specialists.   - GERD: Manages symptoms with dietary modifications and pantoprazole  40 mg daily - Discontinued Pepcid   - Pulmonary specialist initiated inhaled spray, which improved symptoms initially thought to be asthma but now attributed to reflux    Review of Systems See above   Past Medical History:  Diagnosis Date   Adenomatous polyp of colon    Allergy     seasonal   Cataract    Chronic headache    Esophageal reflux    Hyperlipidemia    Lower urinary tract symptoms (LUTS)     Past Surgical History:  Procedure Laterality Date   CHOLECYSTECTOMY  1998   EYE SURGERY Bilateral 11/27/2017   cataract and retina surgery   POLYPECTOMY      Current Outpatient Medications  Medication Instructions   atorvastatin  (LIPITOR) 20 mg, Oral, Daily   fluticasone  (FLONASE ) 50 MCG/ACT nasal spray SHAKE LIQUID AND USE 2 SPRAYS IN EACH NOSTRIL DAILY   levocetirizine (XYZAL ) 5 MG tablet TAKE 1 TABLET(5 MG) BY MOUTH EVERY EVENING   Multiple Vitamins-Minerals (SENIOR MULTIVITAMIN PLUS PO) 1 tablet, Daily   pantoprazole  (PROTONIX ) 40 mg, Oral, Daily before breakfast   tamsulosin  (FLOMAX ) 0.4 mg, Oral, Daily after supper       Objective:   Physical Exam BP 122/82   Pulse (!) 57   Temp 97.9 F (36.6 C) (Oral)   Resp 16   Ht 5' 5 (1.651 m)   Wt 183 lb 4 oz (83.1 kg)   SpO2 97%   BMI 30.49 kg/m  General:   Well developed, NAD, BMI noted. HEENT:  Normocephalic . Face symmetric, atraumatic Lungs:  CTA B Normal respiratory effort, no intercostal retractions, no accessory muscle use. Heart: Soft systolic  murmur Lower extremities: no pretibial edema bilaterally  Skin: Not pale. Not jaundice Neurologic:  alert & oriented X3.  Speech normal, gait appropriate for age and unassisted Psych--  Cognition and judgment appear intact.  Cooperative with normal attention span and concentration.  Behavior appropriate. No anxious or depressed appearing.      Assessment   Assessment Hyperlipidemia GERD Seasonal allergies Obesity H/o increased PSA , LUTS, sees urology Headaches, chronic   Assessment & Plan GERD: EGD 12/2023: Multiple polyps, otherwise negative, no biopsies taken. GI recommended to stop famotidine  and decrease PPIs to once daily.  He also changed his diet.  Doing well, check CBC. High cholesterol Managed with atorvastatin .  Checking labs. Aortic stenosis: Per echo last year, patient aware of red flag symptoms such as fatigue, shortness of breath, or chest pain.  Next echo at the 2-year mark General Health Maintenance Had a flu shot and COVID booster recently Had a colonoscopy December 2025 next per GI. PSAs per urology RTC 6 months   "

## 2024-02-24 NOTE — Patient Instructions (Signed)
 Please read your instructions carefully.   GO TO THE LAB :  Get the blood work    Go to the front desk for the checkout Please make an appointment for a checkup in 6 months  Check the  blood pressure regularly Blood pressure goal:  between 110/65 and  130/80. If it is consistently higher or lower, let me know  Consider a COVID booster at your convenience

## 2024-02-25 ENCOUNTER — Ambulatory Visit: Payer: Self-pay | Admitting: Internal Medicine

## 2024-03-01 ENCOUNTER — Other Ambulatory Visit: Payer: Self-pay | Admitting: Internal Medicine

## 2024-03-04 ENCOUNTER — Other Ambulatory Visit: Payer: Self-pay | Admitting: Internal Medicine

## 2024-07-21 ENCOUNTER — Ambulatory Visit

## 2024-08-23 ENCOUNTER — Ambulatory Visit: Admitting: Internal Medicine

## 2024-10-25 ENCOUNTER — Ambulatory Visit: Admitting: Internal Medicine
